# Patient Record
Sex: Female | Born: 1965 | Race: White | Hispanic: No | Marital: Married | Smoking: Never smoker
Health system: Southern US, Community
[De-identification: ages and names within clinical notes are randomized; demographics above are authoritative.]

## PROBLEM LIST (undated history)

## (undated) ENCOUNTER — Emergency Department (HOSPITAL_COMMUNITY): Payer: 59

## (undated) DIAGNOSIS — N183 Chronic kidney disease, stage 3 unspecified: Secondary | ICD-10-CM

## (undated) DIAGNOSIS — Z8542 Personal history of malignant neoplasm of other parts of uterus: Secondary | ICD-10-CM

## (undated) DIAGNOSIS — C541 Malignant neoplasm of endometrium: Secondary | ICD-10-CM

## (undated) DIAGNOSIS — F988 Other specified behavioral and emotional disorders with onset usually occurring in childhood and adolescence: Secondary | ICD-10-CM

## (undated) DIAGNOSIS — Z87820 Personal history of traumatic brain injury: Secondary | ICD-10-CM

## (undated) DIAGNOSIS — N289 Disorder of kidney and ureter, unspecified: Secondary | ICD-10-CM

## (undated) DIAGNOSIS — K5790 Diverticulosis of intestine, part unspecified, without perforation or abscess without bleeding: Secondary | ICD-10-CM

## (undated) DIAGNOSIS — E039 Hypothyroidism, unspecified: Secondary | ICD-10-CM

## (undated) DIAGNOSIS — K219 Gastro-esophageal reflux disease without esophagitis: Secondary | ICD-10-CM

## (undated) DIAGNOSIS — D219 Benign neoplasm of connective and other soft tissue, unspecified: Secondary | ICD-10-CM

## (undated) DIAGNOSIS — T7840XA Allergy, unspecified, initial encounter: Secondary | ICD-10-CM

## (undated) DIAGNOSIS — K589 Irritable bowel syndrome without diarrhea: Secondary | ICD-10-CM

## (undated) DIAGNOSIS — R7303 Prediabetes: Secondary | ICD-10-CM

## (undated) DIAGNOSIS — E079 Disorder of thyroid, unspecified: Secondary | ICD-10-CM

## (undated) HISTORY — PX: TRANSTHORACIC ECHOCARDIOGRAM: SHX275

## (undated) HISTORY — DX: Allergy, unspecified, initial encounter: T78.40XA

## (undated) HISTORY — PX: ENDOMETRIAL ABLATION: SHX621

## (undated) HISTORY — DX: Diverticulosis of intestine, part unspecified, without perforation or abscess without bleeding: K57.90

## (undated) HISTORY — PX: TUBAL LIGATION: SHX77

## (undated) HISTORY — DX: Benign neoplasm of connective and other soft tissue, unspecified: D21.9

## (undated) HISTORY — DX: Malignant neoplasm of endometrium: C54.1

## (undated) HISTORY — PX: SHOULDER SURGERY: SHX246

## (undated) HISTORY — DX: Personal history of traumatic brain injury: Z87.820

## (undated) HISTORY — DX: Gastro-esophageal reflux disease without esophagitis: K21.9

## (undated) HISTORY — DX: Prediabetes: R73.03

## (undated) HISTORY — DX: Personal history of malignant neoplasm of other parts of uterus: Z85.42

## (undated) HISTORY — DX: Hypothyroidism, unspecified: E03.9

## (undated) HISTORY — DX: Other specified behavioral and emotional disorders with onset usually occurring in childhood and adolescence: F98.8

## (undated) HISTORY — PX: ABDOMINAL HYSTERECTOMY: SHX81

## (undated) HISTORY — DX: Irritable bowel syndrome, unspecified: K58.9

## (undated) HISTORY — DX: Disorder of kidney and ureter, unspecified: N28.9

## (undated) HISTORY — DX: Chronic kidney disease, stage 3 unspecified: N18.30

## (undated) HISTORY — DX: Disorder of thyroid, unspecified: E07.9

---

## 2010-11-23 LAB — HM MAMMOGRAPHY

## 2014-06-17 HISTORY — PX: COLONOSCOPY: SHX174

## 2015-09-01 ENCOUNTER — Encounter: Payer: Self-pay | Admitting: Family Medicine

## 2015-12-27 LAB — LIPID PANEL
CHOLESTEROL: 203 — AB (ref 0–200)
HDL: 71 — AB (ref 35–70)
LDL CALC: 112
TRIGLYCERIDES: 99 (ref 40–160)

## 2015-12-27 LAB — CBC AND DIFFERENTIAL
HEMATOCRIT: 40 (ref 36–46)
HEMOGLOBIN: 13.4 (ref 12.0–16.0)
Platelets: 278 (ref 150–399)
WBC: 9.2

## 2015-12-27 LAB — BASIC METABOLIC PANEL
BUN: 15 (ref 4–21)
GLUCOSE: 88
Potassium: 4.3 (ref 3.4–5.3)
SODIUM: 136 — AB (ref 137–147)

## 2015-12-27 LAB — HEPATIC FUNCTION PANEL
ALT: 15 (ref 7–35)
AST: 22 (ref 13–35)

## 2015-12-27 LAB — TSH: TSH: 1.79 (ref <=5.90)

## 2018-02-19 ENCOUNTER — Ambulatory Visit: Payer: Self-pay | Admitting: Family Medicine

## 2018-02-19 ENCOUNTER — Encounter: Payer: Self-pay | Admitting: Family Medicine

## 2018-02-19 ENCOUNTER — Other Ambulatory Visit: Payer: Self-pay

## 2018-02-19 ENCOUNTER — Ambulatory Visit (INDEPENDENT_AMBULATORY_CARE_PROVIDER_SITE_OTHER): Payer: Self-pay

## 2018-02-19 VITALS — BP 120/80 | HR 99 | Temp 98.6°F | Ht 70.0 in | Wt 157.4 lb

## 2018-02-19 DIAGNOSIS — K589 Irritable bowel syndrome without diarrhea: Secondary | ICD-10-CM | POA: Insufficient documentation

## 2018-02-19 DIAGNOSIS — R1084 Generalized abdominal pain: Secondary | ICD-10-CM

## 2018-02-19 DIAGNOSIS — K921 Melena: Secondary | ICD-10-CM

## 2018-02-19 DIAGNOSIS — Z8542 Personal history of malignant neoplasm of other parts of uterus: Secondary | ICD-10-CM | POA: Insufficient documentation

## 2018-02-19 DIAGNOSIS — K5790 Diverticulosis of intestine, part unspecified, without perforation or abscess without bleeding: Secondary | ICD-10-CM

## 2018-02-19 DIAGNOSIS — K219 Gastro-esophageal reflux disease without esophagitis: Secondary | ICD-10-CM

## 2018-02-19 DIAGNOSIS — K580 Irritable bowel syndrome with diarrhea: Secondary | ICD-10-CM

## 2018-02-19 HISTORY — DX: Diverticulosis of intestine, part unspecified, without perforation or abscess without bleeding: K57.90

## 2018-02-19 LAB — HEMOCCULT GUIAC POC 1CARD (OFFICE): Fecal Occult Blood, POC: NEGATIVE

## 2018-02-19 LAB — COMPREHENSIVE METABOLIC PANEL
ALBUMIN: 4.3 g/dL (ref 3.5–5.2)
ALK PHOS: 93 U/L (ref 39–117)
ALT: 20 U/L (ref 0–35)
AST: 21 U/L (ref 0–37)
BILIRUBIN TOTAL: 0.5 mg/dL (ref 0.2–1.2)
BUN: 12 mg/dL (ref 6–23)
CO2: 33 mEq/L — ABNORMAL HIGH (ref 19–32)
CREATININE: 1.08 mg/dL (ref 0.40–1.20)
Calcium: 10 mg/dL (ref 8.4–10.5)
Chloride: 101 mEq/L (ref 96–112)
GFR: 56.32 mL/min — AB (ref >60.00)
GLUCOSE: 101 mg/dL — AB (ref 70–99)
Potassium: 4.5 mEq/L (ref 3.5–5.1)
Sodium: 140 mEq/L (ref 135–145)
TOTAL PROTEIN: 7.2 g/dL (ref 6.0–8.3)

## 2018-02-19 LAB — CBC WITH DIFFERENTIAL/PLATELET
BASOS ABS: 0 10*3/uL (ref 0.0–0.1)
Basophils Relative: 0.6 % (ref 0.0–3.0)
EOS PCT: 2.1 % (ref 0.0–5.0)
Eosinophils Absolute: 0.2 10*3/uL (ref 0.0–0.7)
HEMATOCRIT: 40.6 % (ref 36.0–46.0)
HEMOGLOBIN: 13.7 g/dL (ref 12.0–15.0)
LYMPHS PCT: 24.3 % (ref 12.0–46.0)
Lymphs Abs: 1.9 10*3/uL (ref 0.7–4.0)
MCHC: 33.7 g/dL (ref 30.0–36.0)
MCV: 92 fl (ref 78.0–100.0)
MONOS PCT: 6.9 % (ref 3.0–12.0)
Monocytes Absolute: 0.5 10*3/uL (ref 0.1–1.0)
NEUTROS PCT: 66.1 % (ref 43.0–77.0)
Neutro Abs: 5 10*3/uL (ref 1.4–7.7)
Platelets: 320 10*3/uL (ref 150.0–400.0)
RBC: 4.42 Mil/uL (ref 3.87–5.11)
RDW: 12.5 % (ref 11.5–15.5)
WBC: 7.6 10*3/uL (ref 4.0–10.5)

## 2018-02-19 LAB — LIPASE: Lipase: 34 U/L (ref 11.0–59.0)

## 2018-02-19 MED ORDER — AMOXICILLIN-POT CLAVULANATE 875-125 MG PO TABS: 1.0000 | ORAL_TABLET | Freq: Two times a day (BID) | ORAL | 0 refills | Status: AC

## 2018-02-19 MED ORDER — METRONIDAZOLE 500 MG PO TABS: 500.0000 mg | ORAL_TABLET | Freq: Two times a day (BID) | ORAL | 0 refills | Status: DC

## 2018-02-19 MED ORDER — CIPROFLOXACIN HCL 500 MG PO TABS: 500.0000 mg | ORAL_TABLET | Freq: Two times a day (BID) | ORAL | 0 refills | Status: DC

## 2018-02-19 MED ORDER — TRAMADOL HCL 50 MG PO TABS: 50.0000 mg | ORAL_TABLET | Freq: Three times a day (TID) | ORAL | 0 refills | Status: DC | PRN

## 2018-02-19 NOTE — Patient Instructions (Signed)
Please return early next week for recheck.  Go to the ER for severe abdominal pain or bleeding from rectum.   We will treat for possible diverticulitis: flagyl and cipro are antibiotics. Tramadol can be used for pain.  Keep track of your temperature.   Please go to our [facility] office to get your xrays done. You can walk in M-F between 8am and 5pm. Tell them you are there for xrays ordered by me. They will send me the results, then I will let you know the results with instructions.   Address: [place], [place], [place]  (office sits at [address] at [address] intersection; from here, turn left onto [place] [address]), take to [address], turn right and go for a mile or so, office will be on left across form [employer] ) I will release your lab results to you on your MyChart account with further instructions. Please reply with any questions.    It was a pleasure meeting you today! Thank you for choosing [place] to meet your healthcare needs! I truly look forward to working with you. If you have any questions or concerns, please send me a message via Mychart or call the office at [phone].

## 2018-02-19 NOTE — Progress Notes (Addendum)
Subjective  CC:  Chief Complaint  Patient presents with  . Establish Care    Moved from [place], 1 year ago, Last Physical 3 years ago   . Abdominal Pain    Lower Abdominal Pain since Saturday and she states her stool was black yesterday, 3 stools yesterday and all black     HPI: Candace Allen is a 52 y.o. female who presents to [place] at [facility] today to establish care with me as a new patient.  However she is been having moderate to severe abdominal pain for the last 5 days and is here for acute visit She is here with her husband.  Moved back from [place] area about a year ago.  Has not establish care with a physician yet.  She does not have health insurance.  They are small business owners. No old records available via care everywhere. [facility]" She has the following concerns or needs:  52 year old female with reported history of irritable bowel syndrome and gastritis proven by endoscopy 3 years ago and reportedly normal colonoscopy with diverticular disease noted presents with 5-day history of bloating, moderate to severe abdominal cramping, soreness to touch, nausea without vomiting, and increased bowel movements with 3 episodes of black tarry stool.  She reports feeling warm but has not checked her temperature.  No palpitations, lightheadedness, chest pain, pleuritic chest pain or urinary symptoms.  She is had problems with her abdomen for the last 3 to 4 years, that is what prompted her GI referral.  Records are not available for review.  She had been placed on PPIs for her gastritis.  She does not use them in the last 2 years.  She does report active GERD symptoms.  No weight loss.  She eats light.  Exercises regularly.  Reports history of chronic kidney disease although I do not have records and she is not exactly sure why she has this.  History of hypertension last treated over a year ago.  Normotensive since then.  No chronic  medications  Assessment  1. Generalized abdominal pain   2. Melena   3. History of endometrial cancer   4. Gastroesophageal reflux disease without esophagitis   5. Irritable bowel syndrome with diarrhea   6. Diverticulosis      Plan   Abdominal pain: Negative stool guaiac and rectal exam today.  Patient is hemodynamically stable.  Nontoxic-appearing.  Differential diagnosis is broad but includes diverticulitis, IBS flare, chronic constipation, appendicitis, cholecystitis, biliary colic etc.  Initial work-up includes the following: Blood work, acute abdominal series, empirically treat for diverticulitis with pain medication and Augmentin and extensive education given while in office.  Red flag symptoms discussed.  If worsens, patient will assess care via the ER.  Otherwise follow-up here early next week for recheck.  If worsening, recommend abdominal CT.  Patient understands and agrees with care plan.  Follow up:  Return in about 5 days (around 02/24/2018) for recheck. Orders Placed This Encounter  Procedures  . DG Abd Acute W/Chest  . CBC with Differential/Platelet  . Comprehensive metabolic panel  . Lipase  . POCT Occult Blood Stool   Meds ordered this encounter  Medications  . traMADol (ULTRAM) 50 MG tablet    Sig: Take 1 tablet (50 mg total) by mouth every 8 (eight) hours as needed.    Dispense:  30 tablet    Refill:  0  . DISCONTD: metroNIDAZOLE (FLAGYL) 500 MG tablet    Sig: Take 1  tablet (500 mg total) by mouth 2 (two) times daily for 7 days.    Dispense:  14 tablet    Refill:  0  . DISCONTD: ciprofloxacin (CIPRO) 500 MG tablet    Sig: Take 1 tablet (500 mg total) by mouth 2 (two) times daily for 7 days.    Dispense:  14 tablet    Refill:  0  . amoxicillin-clavulanate (AUGMENTIN) 875-125 MG tablet    Sig: Take 1 tablet by mouth 2 (two) times daily for 10 days. Please disregard orders for flagyl and cipro.    Dispense:  20 tablet    Refill:  0     No flowsheet data  found.  We updated and reviewed the patient's past history in detail and it is documented below.  Patient Active Problem List   Diagnosis Date Noted  . History of endometrial cancer 02/19/2018  . GERD (gastroesophageal reflux disease) 02/19/2018  . IBS (irritable bowel syndrome) 02/19/2018  . Diverticulosis 02/19/2018    By colonoscopy 2016    Health Maintenance  Topic Date Due  . HIV Screening  09/27/1979  . TETANUS/TDAP  09/27/1983  . PAP SMEAR  09/26/1985  . MAMMOGRAM  09/27/2014  . INFLUENZA VACCINE  01/15/2018  . COLONOSCOPY  09/15/2024    There is no immunization history on file for this patient. No outpatient medications have been marked as taking for the 02/19/18 encounter (Office Visit) with Leamon Arnt, MD.    Allergies: Patient is allergic to flagyl [metronidazole] and iodine. Past Medical History Patient  has a past medical history of Diverticulosis (02/19/2018), History of endometrial cancer (02/19/2018), concussion, and Kidney disease. Past Surgical History Patient  has a past surgical history that includes Shoulder surgery; Abdominal hysterectomy; and Endometrial ablation. Family History: Patient family history includes Aortic dissection in her maternal grandmother; Healthy in her daughter and son; Hypotension in her maternal grandmother; Testicular cancer in her brother. Social History:  Patient  reports that she has never smoked. She has never used smokeless tobacco. She reports that she drinks alcohol. She reports that she does not use drugs.  Review of Systems: Constitutional: negative for fever or malaise Ophthalmic: negative for photophobia, double vision or loss of vision Cardiovascular: negative for chest pain, dyspnea on exertion, or new LE swelling Respiratory: negative for SOB or persistent cough Gastrointestinal: POSITIVE for abdominal pain, change in bowel habits and melena x 2 days Genitourinary: negative for dysuria or gross  hematuria Musculoskeletal: negative for new gait disturbance or muscular weakness Integumentary: negative for new or persistent rashes Neurological: negative for TIA or stroke symptoms Psychiatric: negative for SI or delusions Allergic/Immunologic: negative for hives  Patient Care Team    Relationship Specialty Notifications Start End  Leamon Arnt, MD PCP - General Family Medicine  02/19/18     Objective  Vitals: BP 120/80   Pulse 99   Temp 98.6 F (37 C)   Ht 5' 10"  (1.778 m)   Wt 157 lb 6.4 oz (71.4 kg)   SpO2 99%   BMI 22.58 kg/m  General:  Well developed, well nourished, nontoxic-appearing but uncomfortable with certain movements.   Psych:  Alert and oriented,normal mood and affect HEENT:  Normocephalic, atraumatic, non-icteric sclera, PERRL, oropharynx is without mass or exudate, supple neck without adenopathy, mass or thyromegaly Cardiovascular:  RRR without gallop, rub or murmur, nondisplaced PMI Respiratory:  Good breath sounds bilaterally, CTAB with normal respiratory effort Gastrointestinal: Hypoactive bowel sounds, soft, diffuse abdominal tenderness without guarding or rebound,  no noted masses. No HSM, Rectal exam: Hard dark brown stool in vault.  Guaiac negative, nontender exam.  No masses, no hemorrhoids. MSK: no deformities, contusions. Joints are without erythema or swelling Skin:  Warm, no rashes or suspicious lesions noted Neurologic:    Mental status is normal. Gross motor and sensory exams are normal. Normal gait   Commons side effects, risks, benefits, and alternatives for medications and treatment plan prescribed today were discussed, and the patient expressed understanding of the given instructions. Patient is instructed to call or message via MyChart if he/she has any questions or concerns regarding our treatment plan. No barriers to understanding were identified. We discussed Red Flag symptoms and signs in detail. Patient expressed understanding regarding  what to do in case of urgent or emergency type symptoms.   Medication list was reconciled, printed and provided to the patient in AVS. Patient instructions and summary information was reviewed with the patient as documented in the AVS. This note was prepared with assistance of Dragon voice recognition software. Occasional wrong-word or sound-a-like substitutions may have occurred due to the inherent limitations of voice recognition software

## 2018-02-20 ENCOUNTER — Ambulatory Visit: Payer: Self-pay | Admitting: Family Medicine

## 2018-02-20 ENCOUNTER — Encounter: Payer: Self-pay | Admitting: Family Medicine

## 2018-02-20 ENCOUNTER — Emergency Department (HOSPITAL_COMMUNITY): Payer: Medicaid Other

## 2018-02-20 ENCOUNTER — Emergency Department (HOSPITAL_COMMUNITY)
Admission: EM | Admit: 2018-02-20 | Discharge: 2018-02-20 | Disposition: A | Discharge status: Home / Self Care | Payer: Medicaid Other | Attending: Emergency Medicine | Admitting: Emergency Medicine

## 2018-02-20 DIAGNOSIS — Z8542 Personal history of malignant neoplasm of other parts of uterus: Secondary | ICD-10-CM | POA: Insufficient documentation

## 2018-02-20 DIAGNOSIS — G459 Transient cerebral ischemic attack, unspecified: Secondary | ICD-10-CM

## 2018-02-20 DIAGNOSIS — K2960 Other gastritis without bleeding: Secondary | ICD-10-CM

## 2018-02-20 DIAGNOSIS — K219 Gastro-esophageal reflux disease without esophagitis: Secondary | ICD-10-CM | POA: Insufficient documentation

## 2018-02-20 DIAGNOSIS — R531 Weakness: Secondary | ICD-10-CM

## 2018-02-20 DIAGNOSIS — F419 Anxiety disorder, unspecified: Secondary | ICD-10-CM

## 2018-02-20 DIAGNOSIS — R4781 Slurred speech: Secondary | ICD-10-CM | POA: Insufficient documentation

## 2018-02-20 LAB — I-STAT CHEM 8, ED
BUN: 13 mg/dL (ref 6–20)
CHLORIDE: 103 mmol/L (ref 98–111)
Calcium, Ion: 1.17 mmol/L (ref 1.15–1.40)
Creatinine, Ser: 1.2 mg/dL — ABNORMAL HIGH (ref 0.44–1.00)
Glucose, Bld: 106 mg/dL — ABNORMAL HIGH (ref 70–99)
HCT: 41 % (ref 36.0–46.0)
Hemoglobin: 13.9 g/dL (ref 12.0–15.0)
Potassium: 3.7 mmol/L (ref 3.5–5.1)
SODIUM: 138 mmol/L (ref 135–145)
TCO2: 24 mmol/L (ref 22–32)

## 2018-02-20 LAB — COMPREHENSIVE METABOLIC PANEL
ALBUMIN: 3.7 g/dL (ref 3.5–5.0)
ALK PHOS: 88 U/L (ref 38–126)
ALT: 25 U/L (ref 0–44)
ANION GAP: 13 (ref 5–15)
AST: 26 U/L (ref 15–41)
BUN: 12 mg/dL (ref 6–20)
CALCIUM: 9.9 mg/dL (ref 8.9–10.3)
CO2: 23 mmol/L (ref 22–32)
CREATININE: 1.11 mg/dL — AB (ref 0.44–1.00)
Chloride: 103 mmol/L (ref 98–111)
GFR calc Af Amer: >60 mL/min (ref >60)
GFR calc non Af Amer: 56 mL/min — ABNORMAL LOW (ref >60)
GLUCOSE: 106 mg/dL — AB (ref 70–99)
Potassium: 3.9 mmol/L (ref 3.5–5.1)
SODIUM: 139 mmol/L (ref 135–145)
Total Bilirubin: 0.7 mg/dL (ref 0.3–1.2)
Total Protein: 7.2 g/dL (ref 6.5–8.1)

## 2018-02-20 LAB — CBC
HEMATOCRIT: 42.5 % (ref 36.0–46.0)
HEMOGLOBIN: 13.9 g/dL (ref 12.0–15.0)
MCH: 30.8 pg (ref 26.0–34.0)
MCHC: 32.7 g/dL (ref 30.0–36.0)
MCV: 94 fL (ref 78.0–100.0)
Platelets: 282 10*3/uL (ref 150–400)
RBC: 4.52 MIL/uL (ref 3.87–5.11)
RDW: 11.4 % — ABNORMAL LOW (ref 11.5–15.5)
WBC: 5.8 10*3/uL (ref 4.0–10.5)

## 2018-02-20 LAB — PROTIME-INR
INR: 0.99
Prothrombin Time: 13 seconds (ref 11.4–15.2)

## 2018-02-20 LAB — DIFFERENTIAL
Abs Immature Granulocytes: 0 10*3/uL (ref 0.0–0.1)
BASOS ABS: 0.1 10*3/uL (ref 0.0–0.1)
Basophils Relative: 1 %
Eosinophils Absolute: 0.2 10*3/uL (ref 0.0–0.7)
Eosinophils Relative: 3 %
IMMATURE GRANULOCYTES: 0 %
LYMPHS PCT: 45 %
Lymphs Abs: 2.6 10*3/uL (ref 0.7–4.0)
Monocytes Absolute: 0.4 10*3/uL (ref 0.1–1.0)
Monocytes Relative: 7 %
NEUTROS ABS: 2.5 10*3/uL (ref 1.7–7.7)
Neutrophils Relative %: 44 %

## 2018-02-20 LAB — I-STAT BETA HCG BLOOD, ED (MC, WL, AP ONLY)

## 2018-02-20 LAB — I-STAT TROPONIN, ED: Troponin i, poc: 0.03 ng/mL (ref 0.00–0.08)

## 2018-02-20 LAB — APTT: APTT: 35 s (ref 24–36)

## 2018-02-20 MED ORDER — GI COCKTAIL ~~LOC~~
30.0000 mL | Freq: Once | ORAL | Status: AC
  Administered 2018-02-20: 30 mL via ORAL
  Filled 2018-02-20: qty 30

## 2018-02-20 MED ORDER — ASPIRIN EC 325 MG PO TBEC: 650.0000 mg | DELAYED_RELEASE_TABLET | Freq: Every day | ORAL | Status: DC

## 2018-02-20 MED ORDER — ONDANSETRON HCL 4 MG PO TABS
4.0000 mg | ORAL_TABLET | Freq: Once | ORAL | Status: AC
  Administered 2018-02-20: 4 mg via ORAL
  Filled 2018-02-20: qty 1

## 2018-02-20 MED ORDER — ASPIRIN EC 325 MG PO TBEC
650.0000 mg | DELAYED_RELEASE_TABLET | Freq: Once | ORAL | Status: AC
  Administered 2018-02-20: 650 mg via ORAL
  Filled 2018-02-20: qty 2

## 2018-02-20 MED ORDER — SUCRALFATE 1 GM/10ML PO SUSP: 1.0000 g | Freq: Three times a day (TID) | ORAL | 0 refills | Status: DC

## 2018-02-20 NOTE — Consult Note (Signed)
Referring Physician: Dr. Darl Householder    Chief Complaint: Left sided weakness  HPI: Candace Allen is an 52 y.o. female who presented to the ED this evening via EMS with a c/c of acute onset left sided weakness and numbness. Initially determined to have a LKN of 1730; however, while in MRI the patient then stated to SRT nurse that she went to sleep at 1115 and then woke up with the new symptoms.   Earlier today, a telephone encounter note resulting from a phone call between the patient's husband and the patient's family practice RN documents that the husband was concerned about his wife having symptoms of generalized weakness and not feeling well. The patient was then contacted by phone and endorsed numbness and tingling in her left shoulder with similar symptoms extending from her right elbow to her fingers. The patient had stated that she had felt normal until she took her antibiotic, Tylenol, Omeprazole, and tramadol. She also endorsed having a headache with N/V after taking her meds. She then went to sleep, waking up at 5:30 with symptoms still present, in addition to chest pressure with dyspnea, dizziness, blurred vision, slurred speech and difficulty getting the words out. EMS was then called and patient was transported to the ED emergently.   Of note, she has a history of surgery to her left shoulder.   PMHx includes stage 3 CKD. The patient has an allergy to iodinated contrast (anaphylaxis).   LSN: [id number] tPA Given: No: Out of time window.   Past Medical History:  Diagnosis Date  . Diverticulosis 02/19/2018   By colonoscopy 2016  . History of endometrial cancer 02/19/2018  . Hx of concussion   . Kidney disease     Past Surgical History:  Procedure Laterality Date  . ABDOMINAL HYSTERECTOMY     partial; endometrial cancer  . ENDOMETRIAL ABLATION    . SHOULDER SURGERY      Family History  Problem Relation Age of Onset  . Testicular cancer Brother   . Healthy Daughter   . Healthy Son   .  Hypotension Maternal Grandmother   . Aortic dissection Maternal Grandmother    Social History:  reports that she has never smoked. She has never used smokeless tobacco. She reports that she drinks alcohol. She reports that she does not use drugs.  Allergies:  Allergies  Allergen Reactions  . Flagyl [Metronidazole]   . Iodine     Medications:  Augmentin Tramadol Tylenol Omeprazole  ROS: As per HPI.   Physical Examination: There were no vitals taken for this visit.  HEENT: [place]/AT Lungs: Respirations unlabored Ext: No edema  Neurologic Examination: Mental Status: Alert, fully oriented, anxious affect.  Speech fluent with intact comprehension for basic motor commands and questions about her symptoms, as well as intact naming. No dysarthria. Appears confused when asked to follow a 2-step directional command, but then is able to correctly follow a different 3 step command without difficulty. Cranial Nerves: II:  Visual fields intact without extinction to DSS. PERRL.  III,IV, VI: EOMI without nystagmus. No ptosis.  V,VII: Temp sensation with dysesthesia on the left. No facial droop VIII: hearing intact to voice IX,X: Palate rises symmetrically XI: Symmetric XII: midline tongue extension  Motor: RUE: 5/5 RLE: Will resist examiner with 4/5 strength on hip flexion, knee extension and ADF. Symmetric with LLE strength but does not exhibit pained/effortful affect when moving RLE.  LUE: Slow, labored movement requiring fine motor control when lifting antigravity. When raised passively, LUE drops halfway  above bed, then is maintained in that position without wavering or further drift, suggestive of embellishment. When resisting examiner, LUE with varying strength responding to coaching with a max of 4/5 proximally and distally.  LLE: Able to lift antigravity with an effortful and pained affect. Will resist examiner with 4/5 strength on hip flexion, knee extension and ADF.  Sensory:  Dysesthesia to cool temperature object LUE and LLE. Normal on the right. No extinction to DSS.  Deep Tendon Reflexes:  1+ bilateral brachioradialis and biceps. 2+ patellae bilaterally.  1+ achilles bilaterally.  Plantars: Right: downgoing   Left: downgoing Cerebellar: No ataxia with FNF bilaterally. However, FNF on left is severely slowed, but normal fine motor control in multiple joints is evident when performing the task slowly. When distracted, movement of LUE is of normal speed. Performs H-S bilaterally with no ataxia, but with labored affect when performing with LLE, which is also with slow movements requiring normal fine motor control and strength to execute.  Gait: Deferred  Results for orders placed or performed during the hospital encounter of 02/20/18 (from the past 48 hour(s))  Protime-INR     Status: None   Collection Time: 02/20/18  8:02 PM  Result Value Ref Range   Prothrombin Time 13.0 11.4 - 15.2 seconds   INR 0.99     Comment: Performed at [facility] [address], [place], [place] [address]  APTT     Status: None   Collection Time: 02/20/18  8:02 PM  Result Value Ref Range   aPTT 35 24 - 36 seconds    Comment: Performed at [facility] [address], [place], [place] [address]  CBC     Status: Abnormal   Collection Time: 02/20/18  8:02 PM  Result Value Ref Range   WBC 5.8 4.0 - 10.5 K/uL   RBC 4.52 3.87 - 5.11 MIL/uL   Hemoglobin 13.9 12.0 - 15.0 g/dL   HCT 42.5 36.0 - 46.0 %   MCV 94.0 78.0 - 100.0 fL   MCH 30.8 26.0 - 34.0 pg   MCHC 32.7 30.0 - 36.0 g/dL   RDW 11.4 (L) 11.5 - 15.5 %   Platelets 282 150 - 400 K/uL    Comment: Performed at [facility] [address], [place], [place] [address]  Differential     Status: None   Collection Time: 02/20/18  8:02 PM  Result Value Ref Range   Neutrophils Relative % 44 %   Neutro Abs 2.5 1.7 - 7.7 K/uL   Lymphocytes Relative 45 %   Lymphs Abs 2.6 0.7 - 4.0 K/uL   Monocytes Relative 7 %    Monocytes Absolute 0.4 0.1 - 1.0 K/uL   Eosinophils Relative 3 %   Eosinophils Absolute 0.2 0.0 - 0.7 K/uL   Basophils Relative 1 %   Basophils Absolute 0.1 0.0 - 0.1 K/uL   Immature Granulocytes 0 %   Abs Immature Granulocytes 0.0 0.0 - 0.1 K/uL    Comment: Performed at [facility] [address] [place], [place] [address]  I-stat troponin, ED     Status: None   Collection Time: 02/20/18  8:24 PM  Result Value Ref Range   Troponin i, poc 0.03 0.00 - 0.08 ng/mL   Comment 3            Comment: Due to the release kinetics of cTnI, a negative result within the first hours of the onset of symptoms does not  rule out myocardial infarction with certainty. If myocardial infarction is still suspected, repeat the test at appropriate intervals.   I-Stat Chem 8, ED     Status: Abnormal   Collection Time: 02/20/18  8:25 PM  Result Value Ref Range   Sodium 138 135 - 145 mmol/L   Potassium 3.7 3.5 - 5.1 mmol/L   Chloride 103 98 - 111 mmol/L   BUN 13 6 - 20 mg/dL   Creatinine, Ser 1.20 (H) 0.44 - 1.00 mg/dL   Glucose, Bld 106 (H) 70 - 99 mg/dL   Calcium, Ion 1.17 1.15 - 1.40 mmol/L   TCO2 24 22 - 32 mmol/L   Hemoglobin 13.9 12.0 - 15.0 g/dL   HCT 41.0 36.0 - 46.0 %   Dg Abd Acute W/chest  Result Date: 02/20/2018 CLINICAL DATA:  Generalized abdominal pain. EXAM: DG ABDOMEN ACUTE W/ 1V CHEST COMPARISON:  No prior. FINDINGS: Mediastinum hilar structures normal. Lungs are clear. No pleural effusion or pneumothorax. Heart size normal. Soft tissue structures the abdomen are unremarkable. No bowel distention. Stool noted throughout the colon. No free air. Pelvic calcifications noted most consistent phleboliths. Distal ureteral stones can't be completely excluded. Thoracolumbar spine scoliosis. IMPRESSION: 1.  No acute cardiopulmonary disease. 2.  No acute intra-abdominal abnormality. Electronically Signed   By: Marcello Moores  Register   On: 02/20/2018 07:34   Ct Head Code Stroke Wo Contrast  Result  Date: 02/20/2018 CLINICAL DATA:  Code stroke. 52 y/o F; left-sided weakness and numbness. EXAM: CT HEAD WITHOUT CONTRAST TECHNIQUE: Contiguous axial images were obtained from the base of the skull through the vertex without intravenous contrast. COMPARISON:  None. FINDINGS: Brain: No evidence of acute infarction, hemorrhage, hydrocephalus, extra-axial collection or mass lesion/mass effect. Vascular: No hyperdense vessel or unexpected calcification. Skull: Normal. Negative for fracture or focal lesion. Sinuses/Orbits: No acute finding. Other: None. ASPECTS [facility] Stroke Program Early CT Score) - Ganglionic level infarction (caudate, lentiform nuclei, internal capsule, insula, M1-M3 cortex): 7 - Supraganglionic infarction (M4-M6 cortex): 3 Total score (0-10 with 10 being normal): 10 IMPRESSION: 1. No acute intracranial abnormality identified. Unremarkable CT of the head. 2. ASPECTS is 10 These results were called by telephone at the time of interpretation on 02/20/2018 at 8:23 pm to Dr. Shirlyn Goltz , who verbally acknowledged these results. Electronically Signed   By: Kristine Garbe M.D.   On: 02/20/2018 20:25    Assessment: 52 y.o. female with acute onset of LUE and LLE weakness with subjective sensory numbness and dysesthesia 1. Exam with several discrepancies that appear non-physiological and most consistent with embellishment. No clear localizable findings.  2. CT head negative for acute abnormality 3. Anxiety.   4. Stroke Risk Factors - No definitive risk factors, although CKD may increase the risk of both thrombosis and hemorrhage.  5. CKD and anaphylactic reaction to iodinated contrast per patient. Risks of CTA felt to significantly outweigh benefits. Will obtain STAT MR imaging as safest alternative  Plan: 1. Given relatively high probability of an underlying etiology other than stroke for her presentation, as well as relatively low NIHSS, overall risks of tPA are felt to outweigh benefits.  After risk of hemorrhage, alternate possible diagnosis and potential benefits of tPA were discussed with patient, she deferred decision on treatment until after MRI brain. She was informed of possible delay to treatment with possibility of worsened outcome if MRI reveals a stroke, due to added time needed to obtain MRI. Following this discussion, the patient endorsed to rapid response nurse that  he symptoms had actually begun at 1115 today, which puts her out of the tPA time window.  2. STAT MRI brain, MRA head and MRA neck ordered without contrast.  3. If MRI is positive for stroke or if MRA shows occlusion, will reconsider possible endovascular treatment.  4. If MR imaging more consistent with stroke/TIA/hypoperfusion, obtain TTE as well.   5. PT consult, OT consult, Speech consult 6. Prophylactic therapy- to be determined based upon results of work up. 7. Telemetry monitoring 8. Continue frequent neuro checks  40 minutes spent in the emergent neurological evaluation and management of this patient with acute left sided weakness  @Electronically  signed: Dr. Kerney Elbe 02/20/2018, 8:46 PM

## 2018-02-20 NOTE — Code Documentation (Addendum)
Responded to Code Stroke called at 45.  Pt arrived at [place] with L side weakness/numbness.  CBG-106, NIH-4.  LSN originally paged out as 1730, however per chart, pt became dizzy with N/V after taking her antibiotic around 1112.  She then went to sleep and woke up at 1730 with L sided weakness. LSN changed to 1112. CT-negative for acute changes.  Pt allergic to Iodine so unable to complete CTA.  Pt to STAT MRI at 2024.

## 2018-02-20 NOTE — Telephone Encounter (Signed)
Pt's husband called with concern that wife is feeling generalized weakness and is not feeling well. Husband is driving home form [place] and is not with pt. Called the number listed and verified with husband and left voicemail to call back.  Pt's neighbor called back and put pt on the phone. Pt stated that she is having numbness and tingling to her left  shoulder and numbness and tingling from right elbow to right fingers. Pt stated she felt normal until after she took her antibiotic, Tylenol, Omeprazole, and tramadol. She stated that she had a headache at the time (approx. 1112). Pt stated she became dizzy, became nauseated and vomited and then went to sleep. She woke up at 5:30 and felt the symptoms. Pt also stated that it felt like she had a "horse sitting on her chest.". Pt stated she was having difficulty breathing, dizziness, blurred vision and slurred speech and difficulty getting the right words out. Asked pt to hold up both arms and pt unable to lift the left arm as high as the right. Pt informed that I was going to ask the neighbor to call 911 for her. Pt given reassurance and emotional support. Called back and neighbor called EMS and they were en route. Pt [facility] to [facility] ED. Called husband back and updated him.   Reason for Disposition . [1] Numbness (i.e., loss of sensation) of the face, arm / hand, or leg / foot on one side of the body AND [2] sudden onset AND [3] present now  Answer Assessment - Initial Assessment Questions 1. SYMPTOM: "What is the main symptom you are concerned about?" (e.g., weakness, numbness)     Numbness and tingling left arm to shoulder right fingers to elbow 2. ONSET: "When did this start?" (minutes, hours, days; while sleeping)     Got nauseated and went to sleep 3. LAST NORMAL: "When was the last time you were normal (no symptoms)?"     Before the medicine that pt took at 1100 4. PATTERN "Does this come and go, or has it been constant since it  started?"  "Is it present now?"     constant 5. CARDIAC SYMPTOMS: "Have you had any of the following symptoms: chest pain, difficulty breathing, palpitations?"     "Feels like a horse sitting on chest", difficulty breathing, nausea 6. NEUROLOGIC SYMPTOMS: "Have you had any of the following symptoms: headache, dizziness, vision loss, double vision, changes in speech, unsteady on your feet?"     Headache earlier today but now at the back of neck, dizziness,blurred vision, slurred speech and difficult getting words out left arm drift 7. OTHER SYMPTOMS: "Do you have any other symptoms?"     Nausea and vomiting  Protocols used: NEUROLOGIC DEFICIT-A-AH

## 2018-02-20 NOTE — ED Provider Notes (Addendum)
[facility] [place] EMERGENCY DEPARTMENT Provider Note   CSN: [id number] Arrival date & time: 02/20/18  4627   An emergency department physician performed an initial assessment on this suspected stroke patient at 2002.  History   Chief Complaint No chief complaint on file.   HPI Candace Allen is a 52 y.o. female history of diverticulosis here presenting with trouble speaking, left-sided weakness.  Patient states that around 5:30 PM, she has some trouble speaking as well as some left-sided weakness.  Patient's husband then called the office and office reached her and noticed that she has some slurred speech saw called EMS.  EMS activated code stroke. Dr. Cheral Marker from neurology saw patient on arrival.  Of note, patient has been having intermittent abdominal bloating and distention for the last several days.  She had an acute abdominal series this morning that showed some constipation.  She also was started on Augmentin, omeprazole, tramadol and took a first dose this morning.  She states that she has sensitivities to medicines, especially to pain medicines.  She never had a history of stroke in the past.  Just moved here from [place].   The history is provided by the patient.    Past Medical History:  Diagnosis Date  . Diverticulosis 02/19/2018   By colonoscopy 2016  . History of endometrial cancer 02/19/2018  . Hx of concussion   . Kidney disease     Patient Active Problem List   Diagnosis Date Noted  . History of endometrial cancer 02/19/2018  . GERD (gastroesophageal reflux disease) 02/19/2018  . IBS (irritable bowel syndrome) 02/19/2018  . Diverticulosis 02/19/2018    Past Surgical History:  Procedure Laterality Date  . ABDOMINAL HYSTERECTOMY     partial; endometrial cancer  . ENDOMETRIAL ABLATION    . SHOULDER SURGERY       OB History   None      Home Medications    Prior to Admission medications   Medication Sig Start Date End Date Taking? Authorizing  Provider  amoxicillin-clavulanate (AUGMENTIN) 875-125 MG tablet Take 1 tablet by mouth 2 (two) times daily for 10 days. Please disregard orders for flagyl and cipro. 02/19/18 03/01/18  Leamon Arnt, MD  sucralfate (CARAFATE) 1 GM/10ML suspension Take 10 mLs (1 g total) by mouth 4 (four) times daily -  with meals and at bedtime. 02/20/18   Drenda Freeze, MD  traMADol (ULTRAM) 50 MG tablet Take 1 tablet (50 mg total) by mouth every 8 (eight) hours as needed. 02/19/18   Leamon Arnt, MD    Family History Family History  Problem Relation Age of Onset  . Testicular cancer Brother   . Healthy Daughter   . Healthy Son   . Hypotension Maternal Grandmother   . Aortic dissection Maternal Grandmother     Social History Social History   Tobacco Use  . Smoking status: Never Smoker  . Smokeless tobacco: Never Used  Substance Use Topics  . Alcohol use: Yes    Comment: occasionally   . Drug use: Never     Allergies   Flagyl [metronidazole] and Iodine   Review of Systems Review of Systems  Neurological: Positive for speech difficulty and weakness.  All other systems reviewed and are negative.    Physical Exam Updated Vital Signs There were no vitals taken for this visit.  Physical Exam  Constitutional: She is oriented to person, place, and time. She appears well-developed and well-nourished.  HENT:  Head: Normocephalic.  Mouth/Throat: Oropharynx is  clear and moist.  Eyes: Pupils are equal, round, and reactive to light. Conjunctivae and EOM are normal.  Neck: Normal range of motion.  Cardiovascular: Normal rate, regular rhythm and normal heart sounds.  Pulmonary/Chest: Effort normal and breath sounds normal. No stridor. No respiratory distress.  Abdominal: Soft. Bowel sounds are normal. She exhibits no distension. There is no tenderness.  Musculoskeletal: Normal range of motion.  Neurological: She is alert and oriented to person, place, and time.  No obvious slurred speech.  No facial droop. CN 2- 12 intact. ? 4/5 strength L arm and leg but sensation intact   Skin: Skin is warm. Capillary refill takes less than 2 seconds.  Psychiatric: She has a normal mood and affect.  Nursing note and vitals reviewed.    ED Treatments / Results  Labs (all labs ordered are listed, but only abnormal results are displayed) Labs Reviewed  CBC - Abnormal; Notable for the following components:      Result Value   RDW 11.4 (*)    All other components within normal limits  COMPREHENSIVE METABOLIC PANEL - Abnormal; Notable for the following components:   Glucose, Bld 106 (*)    Creatinine, Ser 1.11 (*)    GFR calc non Af Amer 56 (*)    All other components within normal limits  I-STAT CHEM 8, ED - Abnormal; Notable for the following components:   Creatinine, Ser 1.20 (*)    Glucose, Bld 106 (*)    All other components within normal limits  PROTIME-INR  APTT  DIFFERENTIAL  I-STAT TROPONIN, ED  I-STAT BETA HCG BLOOD, ED ([facility], [facility], [facility] ONLY)  CBG MONITORING, ED    EKG EKG Interpretation  Date/Time:  Friday February 20 2018 21:41:02 EDT Ventricular Rate:  62 PR Interval:    QRS Duration: 85 QT Interval:  440 QTC Calculation: 447 R Axis:   77 Text Interpretation:  Sinus rhythm Borderline short PR interval RSR' in V1 or V2, probably normal variant No previous ECGs available Confirmed by Wandra Arthurs ([id number]) on 02/20/2018 10:17:23 PM   Radiology Mr Jodene Nam Head Wo Contrast  Result Date: 02/20/2018 CLINICAL DATA:  52 y/o  F; left-sided weakness and numbness. EXAM: MRI HEAD WITHOUT CONTRAST MRA HEAD WITHOUT CONTRAST MRA NECK WITHOUT CONTRAST TECHNIQUE: Multiplanar, multiecho pulse sequences of the brain and surrounding structures were obtained without intravenous contrast. Angiographic images of the Circle of Willis were obtained using MRA technique without intravenous contrast. Angiographic images of the neck were obtained using MRA technique without intravenous contrast. Carotid  stenosis measurements (when applicable) are obtained utilizing NASCET criteria, using the distal internal carotid diameter as the denominator. COMPARISON:  02/20/2018 CT head. FINDINGS: MRI HEAD FINDINGS Brain: No acute infarction, hemorrhage, hydrocephalus, extra-axial collection or mass lesion. Vascular: Normal flow voids. Skull and upper cervical spine: Normal marrow signal. Sinuses/Orbits: Negative. Other: None. MRA HEAD FINDINGS Internal carotid arteries:  Patent. Anterior cerebral arteries:  Patent. Middle cerebral arteries: Patent. Anterior communicating artery: Patent. Posterior communicating arteries: Patent right posterior communicating artery. No left posterior communicating artery identified, likely hypoplastic or absent. Posterior cerebral arteries:  Patent. Basilar artery:  Patent. Vertebral arteries:  Patent. No evidence of high-grade stenosis, large vessel occlusion, or aneurysm. MRA NECK FINDINGS Aortic arch: Patent. Right common carotid artery: Patent. Right internal carotid artery: Patent. Right vertebral artery: Patent.  Right dominant. Left common carotid artery: Patent. Left Internal carotid artery: Patent. Left Vertebral artery: Patent. There is no evidence of hemodynamically significant stenosis by NASCET criteria,  occlusion, or aneurysm. IMPRESSION: 1. No acute intracranial abnormality identified. Unremarkable MRI of the brain. 2. Normal MRA of the head. 3. Normal MRA of the neck. Electronically Signed   By: Kristine Garbe M.D.   On: 02/20/2018 21:52   Mr Jodene Nam Neck Wo Contrast  Result Date: 02/20/2018 CLINICAL DATA:  52 y/o  F; left-sided weakness and numbness. EXAM: MRI HEAD WITHOUT CONTRAST MRA HEAD WITHOUT CONTRAST MRA NECK WITHOUT CONTRAST TECHNIQUE: Multiplanar, multiecho pulse sequences of the brain and surrounding structures were obtained without intravenous contrast. Angiographic images of the Circle of Willis were obtained using MRA technique without intravenous  contrast. Angiographic images of the neck were obtained using MRA technique without intravenous contrast. Carotid stenosis measurements (when applicable) are obtained utilizing NASCET criteria, using the distal internal carotid diameter as the denominator. COMPARISON:  02/20/2018 CT head. FINDINGS: MRI HEAD FINDINGS Brain: No acute infarction, hemorrhage, hydrocephalus, extra-axial collection or mass lesion. Vascular: Normal flow voids. Skull and upper cervical spine: Normal marrow signal. Sinuses/Orbits: Negative. Other: None. MRA HEAD FINDINGS Internal carotid arteries:  Patent. Anterior cerebral arteries:  Patent. Middle cerebral arteries: Patent. Anterior communicating artery: Patent. Posterior communicating arteries: Patent right posterior communicating artery. No left posterior communicating artery identified, likely hypoplastic or absent. Posterior cerebral arteries:  Patent. Basilar artery:  Patent. Vertebral arteries:  Patent. No evidence of high-grade stenosis, large vessel occlusion, or aneurysm. MRA NECK FINDINGS Aortic arch: Patent. Right common carotid artery: Patent. Right internal carotid artery: Patent. Right vertebral artery: Patent.  Right dominant. Left common carotid artery: Patent. Left Internal carotid artery: Patent. Left Vertebral artery: Patent. There is no evidence of hemodynamically significant stenosis by NASCET criteria, occlusion, or aneurysm. IMPRESSION: 1. No acute intracranial abnormality identified. Unremarkable MRI of the brain. 2. Normal MRA of the head. 3. Normal MRA of the neck. Electronically Signed   By: Kristine Garbe M.D.   On: 02/20/2018 21:52   Mr Brain Wo Contrast  Result Date: 02/20/2018 CLINICAL DATA:  52 y/o  F; left-sided weakness and numbness. EXAM: MRI HEAD WITHOUT CONTRAST MRA HEAD WITHOUT CONTRAST MRA NECK WITHOUT CONTRAST TECHNIQUE: Multiplanar, multiecho pulse sequences of the brain and surrounding structures were obtained without intravenous  contrast. Angiographic images of the Circle of Willis were obtained using MRA technique without intravenous contrast. Angiographic images of the neck were obtained using MRA technique without intravenous contrast. Carotid stenosis measurements (when applicable) are obtained utilizing NASCET criteria, using the distal internal carotid diameter as the denominator. COMPARISON:  02/20/2018 CT head. FINDINGS: MRI HEAD FINDINGS Brain: No acute infarction, hemorrhage, hydrocephalus, extra-axial collection or mass lesion. Vascular: Normal flow voids. Skull and upper cervical spine: Normal marrow signal. Sinuses/Orbits: Negative. Other: None. MRA HEAD FINDINGS Internal carotid arteries:  Patent. Anterior cerebral arteries:  Patent. Middle cerebral arteries: Patent. Anterior communicating artery: Patent. Posterior communicating arteries: Patent right posterior communicating artery. No left posterior communicating artery identified, likely hypoplastic or absent. Posterior cerebral arteries:  Patent. Basilar artery:  Patent. Vertebral arteries:  Patent. No evidence of high-grade stenosis, large vessel occlusion, or aneurysm. MRA NECK FINDINGS Aortic arch: Patent. Right common carotid artery: Patent. Right internal carotid artery: Patent. Right vertebral artery: Patent.  Right dominant. Left common carotid artery: Patent. Left Internal carotid artery: Patent. Left Vertebral artery: Patent. There is no evidence of hemodynamically significant stenosis by NASCET criteria, occlusion, or aneurysm. IMPRESSION: 1. No acute intracranial abnormality identified. Unremarkable MRI of the brain. 2. Normal MRA of the head. 3. Normal MRA of the neck. Electronically Signed  By: Kristine Garbe M.D.   On: 02/20/2018 21:52   Dg Abd Acute W/chest  Result Date: 02/20/2018 CLINICAL DATA:  Generalized abdominal pain. EXAM: DG ABDOMEN ACUTE W/ 1V CHEST COMPARISON:  No prior. FINDINGS: Mediastinum hilar structures normal. Lungs are clear.  No pleural effusion or pneumothorax. Heart size normal. Soft tissue structures the abdomen are unremarkable. No bowel distention. Stool noted throughout the colon. No free air. Pelvic calcifications noted most consistent phleboliths. Distal ureteral stones can't be completely excluded. Thoracolumbar spine scoliosis. IMPRESSION: 1.  No acute cardiopulmonary disease. 2.  No acute intra-abdominal abnormality. Electronically Signed   By: Marcello Moores  Register   On: 02/20/2018 07:34   Ct Head Code Stroke Wo Contrast  Result Date: 02/20/2018 CLINICAL DATA:  Code stroke. 52 y/o F; left-sided weakness and numbness. EXAM: CT HEAD WITHOUT CONTRAST TECHNIQUE: Contiguous axial images were obtained from the base of the skull through the vertex without intravenous contrast. COMPARISON:  None. FINDINGS: Brain: No evidence of acute infarction, hemorrhage, hydrocephalus, extra-axial collection or mass lesion/mass effect. Vascular: No hyperdense vessel or unexpected calcification. Skull: Normal. Negative for fracture or focal lesion. Sinuses/Orbits: No acute finding. Other: None. ASPECTS [facility] Stroke Program Early CT Score) - Ganglionic level infarction (caudate, lentiform nuclei, internal capsule, insula, M1-M3 cortex): 7 - Supraganglionic infarction (M4-M6 cortex): 3 Total score (0-10 with 10 being normal): 10 IMPRESSION: 1. No acute intracranial abnormality identified. Unremarkable CT of the head. 2. ASPECTS is 10 These results were called by telephone at the time of interpretation on 02/20/2018 at 8:23 pm to Dr. Shirlyn Goltz , who verbally acknowledged these results. Electronically Signed   By: Kristine Garbe M.D.   On: 02/20/2018 20:25    Procedures Procedures (including critical care time)  Medications Ordered in ED Medications  aspirin EC tablet 650 mg (has no administration in time range)  gi cocktail (Maalox,Lidocaine,Donnatal) (has no administration in time range)     Initial Impression / Assessment and  Plan / ED Course  I have reviewed the triage vital signs and the nursing notes.  Pertinent labs & imaging results that were available during my care of the patient were reviewed by me and considered in my medical decision making (see chart for details).     Candace Allen is a 52 y.o. female here with slurred speech, L sided weakness. Symptoms improving already. She appears very anxious and took some tramadol prior to arrival. Consider medication side effect. Neurology saw patient and will get CT head, MRI brain and MRA.   10:17 PM MRI and MRA and CT negative. Labs [place]. I discussed with Dr. Cheral Marker who doesn't recommend any further neuro workup. I think likely side effect of tramadol. Told her to hold off on tramadol and continue her augmentin, omeprazole. Will refer to neuro, GI outpatient.    Final Clinical Impressions(s) / ED Diagnoses   Final diagnoses:  Slurred speech  Reflux gastritis    ED Discharge Orders         Ordered    sucralfate (CARAFATE) 1 GM/10ML suspension  3 times daily with meals & bedtime     02/20/18 2215           Drenda Freeze, MD 02/20/18 2214    Drenda Freeze, MD 02/20/18 2218

## 2018-02-20 NOTE — ED Notes (Addendum)
Initial NIHSS accomplished by rapid response team member at 2030. See EHR for details.

## 2018-02-20 NOTE — ED Notes (Addendum)
Pt was able to ambulate to wheelchair without assistance at time of discharge. Pt expressed being "just a little weak on the left" as she sat in the wheelchair. Clear speech noted at time of discharge. Pt given paper scrub pants for ride home. Instructions given to husband who verbalized understanding of instructions and prescription. Pt alert and oriented at time of discharge (~22:50). Pain also assessed at 0/10.

## 2018-02-20 NOTE — Discharge Instructions (Addendum)
Stop taking tramadol as it may cause your weakness and slurred speech.   Continue augmentin, prilosec.   You may take carafate to help with your stomach pain.   You should see your primary care doctor next week   You can consider following up with neurology if you have weakness and slurred speech.   See GI doctor for follow up for possible endoscopy if you have persistent abdominal pain   Return to ER if you have worse weakness, slurred speech, abdominal pain, vomiting.

## 2018-02-23 NOTE — Telephone Encounter (Signed)
FYI

## 2018-02-24 ENCOUNTER — Encounter: Payer: Self-pay | Admitting: Family Medicine

## 2018-02-24 ENCOUNTER — Ambulatory Visit: Payer: Self-pay | Admitting: Family Medicine

## 2018-02-24 ENCOUNTER — Other Ambulatory Visit: Payer: Self-pay

## 2018-02-24 VITALS — BP 124/80 | HR 67 | Ht 70.0 in | Wt 156.2 lb

## 2018-02-24 DIAGNOSIS — K581 Irritable bowel syndrome with constipation: Secondary | ICD-10-CM

## 2018-02-24 DIAGNOSIS — R1084 Generalized abdominal pain: Secondary | ICD-10-CM

## 2018-02-24 NOTE — Progress Notes (Signed)
Subjective  CC:  Chief Complaint  Patient presents with  . Follow-up    went to ER friday, states that she has symptoms of a stroke, all tests came back normal.   . Abdominal Pain    still having constipation and feels bloated    HPI: Candace Allen is a 52 y.o. female who presents to the office today to address the problems listed above in the chief complaint.  [name] is here today for follow-up of abdominal pain.  Unfortunately, since I saw her last week, she ended up going to the emergency room due to paresis, lightheadedness and dizziness.  She was worked up for acute stroke, however, her exam and symptoms were more consistent with side effects from tramadol.  She has not had any more of the symptoms and has not taken any more pain medicine  Abdominal pain: Work-up revealed normal lab work, normal acute abdominal series except for stool in the colon, she was treated with antibiotics and reports her pain is improved however she still feels bloated and constipated.  She is taking mag citrate and Dulcolax without resolution of her constipation.  Further history taking: She endorses chronic abdominal constipation and bloating symptoms.  Endoscopy and colonoscopy most with IBS.  Is not to take medicines.  She is a very healthy diet and exercises regularly although she has not been able to recently due to pain.  She admits to recent stressors with acute illness of her grandmother.  Assessment  1. Generalized abdominal pain   2. Irritable bowel syndrome with constipation      Plan   IBS: Symptoms most consistent with acute flare of IBS with chronic constipation.  Sense of counseling and education given.  Doubt that she had acute diverticulitis to me stop antibiotics.  Recommend natural treatment methods including rest, meditation, possibly acupuncture.  Patient declines prescription medications at this time.  Recommend Gas-X, MiraLAX and Colace to help with constipation.  Offered GI referral but  patient declines at this time, this is reasonable  Follow up: Return in about 3 months (around 05/26/2018) for complete physical.   No orders of the defined types were placed in this encounter.  No orders of the defined types were placed in this encounter.     I reviewed the patients updated PMH, FH, and SocHx.    Patient Active Problem List   Diagnosis Date Noted  . History of endometrial cancer 02/19/2018  . GERD (gastroesophageal reflux disease) 02/19/2018  . IBS (irritable bowel syndrome) 02/19/2018  . Diverticulosis 02/19/2018   Current Meds  Medication Sig  . amoxicillin-clavulanate (AUGMENTIN) 875-125 MG tablet Take 1 tablet by mouth 2 (two) times daily for 10 days. Please disregard orders for flagyl and cipro.  . [DISCONTINUED] sucralfate (CARAFATE) 1 GM/10ML suspension Take 10 mLs (1 g total) by mouth 4 (four) times daily -  with meals and at bedtime.    Allergies: Patient is allergic to flagyl [metronidazole]; iodine; and tramadol hcl. Family History: Patient family history includes Aortic dissection in her maternal grandmother; Healthy in her daughter and son; Hypotension in her maternal grandmother; Testicular cancer in her brother. Social History:  Patient  reports that she has never smoked. She has never used smokeless tobacco. She reports that she drinks alcohol. She reports that she does not use drugs.  Review of Systems: Constitutional: Negative for fever malaise or anorexia Cardiovascular: negative for chest pain Respiratory: negative for SOB or persistent cough Gastrointestinal: negative for abdominal pain  Objective  Vitals: BP 124/80   Pulse 67   Ht 5' 10"  (1.778 m)   Wt 156 lb 3.2 oz (70.9 kg)   SpO2 98%   BMI 22.41 kg/m  General: no acute distress , A&Ox3 Psych: Flat affect, at times tearful, limited insight HEENT: PEERL, conjunctiva normal, Oropharynx moist,neck is supple Cardiovascular:  RRR without murmur or gallop.  Respiratory:  Good  breath sounds bilaterally, CTAB with normal respiratory effort Abdominal exam: Hypoactive bowel sounds, soft with diffuse minimal tenderness without mass, rebound, guarding Skin:  Warm, no rashes  No visits with results within 1 Day(s) from this visit.  Latest known visit with results is:  Admission on 02/20/2018, Discharged on 02/20/2018  Component Date Value Ref Range Status  . Prothrombin Time 02/20/2018 13.0  11.4 - 15.2 seconds Final  . INR 02/20/2018 0.99   Final  . aPTT 02/20/2018 35  24 - 36 seconds Final  . WBC 02/20/2018 5.8  4.0 - 10.5 K/uL Final  . RBC 02/20/2018 4.52  3.87 - 5.11 MIL/uL Final  . Hemoglobin 02/20/2018 13.9  12.0 - 15.0 g/dL Final  . HCT 02/20/2018 42.5  36.0 - 46.0 % Final  . MCV 02/20/2018 94.0  78.0 - 100.0 fL Final  . MCH 02/20/2018 30.8  26.0 - 34.0 pg Final  . MCHC 02/20/2018 32.7  30.0 - 36.0 g/dL Final  . RDW 02/20/2018 11.4* 11.5 - 15.5 % Final  . Platelets 02/20/2018 282  150 - 400 K/uL Final  . Neutrophils Relative % 02/20/2018 44  % Final  . Neutro Abs 02/20/2018 2.5  1.7 - 7.7 K/uL Final  . Lymphocytes Relative 02/20/2018 45  % Final  . Lymphs Abs 02/20/2018 2.6  0.7 - 4.0 K/uL Final  . Monocytes Relative 02/20/2018 7  % Final  . Monocytes Absolute 02/20/2018 0.4  0.1 - 1.0 K/uL Final  . Eosinophils Relative 02/20/2018 3  % Final  . Eosinophils Absolute 02/20/2018 0.2  0.0 - 0.7 K/uL Final  . Basophils Relative 02/20/2018 1  % Final  . Basophils Absolute 02/20/2018 0.1  0.0 - 0.1 K/uL Final  . Immature Granulocytes 02/20/2018 0  % Final  . Abs Immature Granulocytes 02/20/2018 0.0  0.0 - 0.1 K/uL Final  . Sodium 02/20/2018 139  135 - 145 mmol/L Final  . Potassium 02/20/2018 3.9  3.5 - 5.1 mmol/L Final  . Chloride 02/20/2018 103  98 - 111 mmol/L Final  . CO2 02/20/2018 23  22 - 32 mmol/L Final  . Glucose, Bld 02/20/2018 106* 70 - 99 mg/dL Final  . BUN 02/20/2018 12  6 - 20 mg/dL Final  . Creatinine, Ser 02/20/2018 1.11* 0.44 - 1.00 mg/dL  Final  . Calcium 02/20/2018 9.9  8.9 - 10.3 mg/dL Final  . Total Protein 02/20/2018 7.2  6.5 - 8.1 g/dL Final  . Albumin 02/20/2018 3.7  3.5 - 5.0 g/dL Final  . AST 02/20/2018 26  15 - 41 U/L Final  . ALT 02/20/2018 25  0 - 44 U/L Final  . Alkaline Phosphatase 02/20/2018 88  38 - 126 U/L Final  . Total Bilirubin 02/20/2018 0.7  0.3 - 1.2 mg/dL Final  . GFR calc non Af Amer 02/20/2018 56* >60 mL/min Final  . GFR calc Af Amer 02/20/2018 >60  >60 mL/min Final  . Anion gap 02/20/2018 13  5 - 15 Final  . Troponin i, poc 02/20/2018 0.03  0.00 - 0.08 ng/mL Final  . Comment 3 02/20/2018  Final  . Sodium 02/20/2018 138  135 - 145 mmol/L Final  . Potassium 02/20/2018 3.7  3.5 - 5.1 mmol/L Final  . Chloride 02/20/2018 103  98 - 111 mmol/L Final  . BUN 02/20/2018 13  6 - 20 mg/dL Final  . Creatinine, Ser 02/20/2018 1.20* 0.44 - 1.00 mg/dL Final  . Glucose, Bld 02/20/2018 106* 70 - 99 mg/dL Final  . Calcium, Ion 02/20/2018 1.17  1.15 - 1.40 mmol/L Final  . TCO2 02/20/2018 24  22 - 32 mmol/L Final  . Hemoglobin 02/20/2018 13.9  12.0 - 15.0 g/dL Final  . HCT 02/20/2018 41.0  36.0 - 46.0 % Final  . I-stat hCG, quantitative 02/20/2018 <5.0  <5 mIU/mL Final  . Comment 3 02/20/2018          Final      Commons side effects, risks, benefits, and alternatives for medications and treatment plan prescribed today were discussed, and the patient expressed understanding of the given instructions. Patient is instructed to call or message via MyChart if he/she has any questions or concerns regarding our treatment plan. No barriers to understanding were identified. We discussed Red Flag symptoms and signs in detail. Patient expressed understanding regarding what to do in case of urgent or emergency type symptoms.   Medication list was reconciled, printed and provided to the patient in AVS. Patient instructions and summary information was reviewed with the patient as documented in the AVS. This note was  prepared with assistance of Dragon voice recognition software. Occasional wrong-word or sound-a-like substitutions may have occurred due to the inherent limitations of voice recognition software

## 2018-02-24 NOTE — Patient Instructions (Signed)
Please return in 1-3 months for your annual complete physical; please come fasting.   If you have any questions or concerns, please don't hesitate to send me a message via MyChart or call the office at [phone]. Thank you for visiting with [place] today! It's our pleasure caring for you.  Use colace and miralax to help manage your symptoms.   Irritable Bowel Syndrome, Adult Irritable bowel syndrome (IBS) is not one specific disease. It is a group of symptoms that affects the organs responsible for digestion (gastrointestinal or GI tract). To regulate how your GI tract works, your body sends signals back and forth between your intestines and your brain. If you have IBS, there may be a problem with these signals. As a result, your GI tract does not function normally. Your intestines may become more sensitive and overreact to certain things. This is especially true when you eat certain foods or when you are under stress. There are four types of IBS. These may be determined based on the consistency of your stool:  IBS with diarrhea.  IBS with constipation.  Mixed IBS.  Unsubtyped IBS.  It is important to know which type of IBS you have. Some treatments are more likely to be helpful for certain types of IBS. What are the causes? The exact cause of IBS is not known. What increases the risk? You may have a higher risk of IBS if:  You are a woman.  You are younger than 53 years old.  You have a family history of IBS.  You have mental health problems.  You have had bacterial infection of your GI tract.  What are the signs or symptoms? Symptoms of IBS vary from person to person. The main symptom is abdominal pain or discomfort. Additional symptoms usually include one or more of the following:  Diarrhea, constipation, or both.  Abdominal swelling or bloating.  Feeling full or sick after eating a small or regular-size meal.  Frequent gas.  Mucus in the stool.  A feeling of having  more stool left after a bowel movement.  Symptoms tend to come and go. They may be associated with stress, psychiatric conditions, or nothing at all. How is this diagnosed? There is no specific test to diagnose IBS. Your health care provider will make a diagnosis based on a physical exam, medical history, and your symptoms. You may have other tests to rule out other conditions that may be causing your symptoms. These may include:  Blood tests.  X-rays.  CT scan.  Endoscopy and colonoscopy. This is a test in which your GI tract is viewed with a long, thin, flexible tube.  How is this treated? There is no cure for IBS, but treatment can help relieve symptoms. IBS treatment often includes:  Changes to your diet, such as: ? Eating more fiber. ? Avoiding foods that cause symptoms. ? Drinking more water. ? Eating regular, medium-sized portioned meals.  Medicines. These may include: ? Fiber supplements if you have constipation. ? Medicine to control diarrhea (antidiarrheal medicines). ? Medicine to help control muscle spasms in your GI tract (antispasmodic medicines). ? Medicines to help with any mental health issues, such as antidepressants or tranquilizers.  Therapy. ? Talk therapy may help with anxiety, depression, or other mental health issues that can make IBS symptoms worse.  Stress reduction. ? Managing your stress can help keep symptoms under control.  Follow these instructions at home:  Take medicines only as directed by your health care provider.  Eat a  healthy diet. ? Avoid foods and drinks with added sugar. ? Include more whole grains, fruits, and vegetables gradually into your diet. This may be especially helpful if you have IBS with constipation. ? Avoid any foods and drinks that make your symptoms worse. These may include dairy products and caffeinated or carbonated drinks. ? Do not eat large meals. ? Drink enough fluid to keep your urine clear or pale  yellow.  Exercise regularly. Ask your health care provider for recommendations of good activities for you.  Keep all follow-up visits as directed by your health care provider. This is important. Contact a health care provider if:  You have constant pain.  You have trouble or pain with swallowing.  You have worsening diarrhea. Get help right away if:  You have severe and worsening abdominal pain.  You have diarrhea and: ? You have a rash, stiff neck, or severe headache. ? You are irritable, sleepy, or difficult to awaken. ? You are weak, dizzy, or extremely thirsty.  You have bright red blood in your stool or you have black tarry stools.  You have unusual abdominal swelling that is painful.  You vomit continuously.  You vomit blood (hematemesis).  You have both abdominal pain and a fever. This information is not intended to replace advice given to you by your health care provider. Make sure you discuss any questions you have with your health care provider. Document Released: 06/03/2005 Document Revised: 11/03/2015 Document Reviewed: 02/18/2014 Elsevier Interactive Patient Education  2018 [employer].

## 2018-03-06 ENCOUNTER — Encounter: Payer: Self-pay | Admitting: Emergency Medicine

## 2018-03-12 ENCOUNTER — Encounter: Payer: Self-pay | Admitting: Family Medicine

## 2018-06-29 ENCOUNTER — Ambulatory Visit: Payer: Self-pay | Admitting: Family Medicine

## 2018-06-29 ENCOUNTER — Encounter: Payer: Self-pay | Admitting: Family Medicine

## 2018-06-29 ENCOUNTER — Other Ambulatory Visit: Payer: Self-pay

## 2018-06-29 VITALS — BP 118/82 | HR 66 | Temp 98.5°F | Resp 16 | Ht 70.0 in | Wt 161.6 lb

## 2018-06-29 DIAGNOSIS — J0100 Acute maxillary sinusitis, unspecified: Secondary | ICD-10-CM

## 2018-06-29 MED ORDER — AMOXICILLIN-POT CLAVULANATE 875-125 MG PO TABS: 1.0000 | ORAL_TABLET | Freq: Two times a day (BID) | ORAL | 0 refills | Status: DC

## 2018-06-29 MED ORDER — FLUTICASONE PROPIONATE 50 MCG/ACT NA SUSP: 2.0000 | Freq: Every day | NASAL | 6 refills | Status: DC

## 2018-06-29 NOTE — Progress Notes (Signed)
Subjective   CC:  Chief Complaint  Patient presents with  . Sinusitis    Symptoms started before Christmas, has tried OTC medication    HPI: Candace Allen is a 53 y.o. female who presents to the office today to address the problems listed above in the chief complaint.  Patient reports sinus congestion and pressure with thick drainage, mild nonproductive cough, ear pressure without pain, and mild malaise.  Symptoms have been present for several weeks. No relief with OTC meds.Shedenies high fevers, GI symptoms, shortness of breath. Shehas had sinus infections in the past and this feels similar.  Patient is a non-smoker.  No history of asthma or COPD.  I reviewed the patients updated PMH, FH, and SocHx.    Patient Active Problem List   Diagnosis Date Noted  . History of endometrial cancer 02/19/2018  . GERD (gastroesophageal reflux disease) 02/19/2018  . IBS (irritable bowel syndrome) 02/19/2018  . Diverticulosis 02/19/2018   No outpatient medications have been marked as taking for the 06/29/18 encounter (Office Visit) with Leamon Arnt, MD.    Review of Systems: Cardiovascular: negative for chest pain Respiratory: negative for SOB or persistent cough Gastrointestinal: negative for abdominal pain Genitourinary: negative for dysuria or gross hematuria  Objective  Vitals: BP 118/82   Pulse 66   Temp 98.5 F (36.9 C) (Oral)   Resp 16   Ht 5' 10"  (1.778 m)   Wt 161 lb 9.6 oz (73.3 kg)   SpO2 97%   BMI 23.19 kg/m  General: no acute distress  Psych:  Alert and oriented, normal mood and affect HEENT:  Normocephalic, atraumatic, TMs with serous effusions or retraction w/o erythema, nasal mucosa is red with purulent drainage, tender maxillary sinus present, OP mild erythematous w/o eudate, supple neck without LAD Cardiovascular:  RRR without murmur or gallop. no peripheral edema Respiratory:  Good breath sounds bilaterally, CTAB with normal respiratory effort Skin:  Warm, no  rashes Neurologic:   Mental status is normal. normal gait  Assessment  1. Acute non-recurrent maxillary sinusitis      Plan    Sinusitis: History and exam is most consistent with bacterial sinus infection.  Etiology and prognosis discussed with patient.  Recommend antibiotics as ordered below.  Patient to complete course of antibiotics, use supportive medications like mucolytics and decongestants as needed.  May use Tylenol or Advil if needed.  Symptoms should improve over the next 2 weeks.  Patient will return or call if symptoms persist or worsen.  Follow up: No follow-ups on file.    Commons side effects, risks, benefits, and alternatives for medications and treatment plan prescribed today were discussed, and the patient expressed understanding of the given instructions. Patient is instructed to call or message via MyChart if he/she has any questions or concerns regarding our treatment plan. No barriers to understanding were identified. We discussed Red Flag symptoms and signs in detail. Patient expressed understanding regarding what to do in case of urgent or emergency type symptoms.   Medication list was reconciled, printed and provided to the patient in AVS. Patient instructions and summary information was reviewed with the patient as documented in the AVS. This note was prepared with assistance of Dragon voice recognition software. Occasional wrong-word or sound-a-like substitutions may have occurred due to the inherent limitations of voice recognition software  No orders of the defined types were placed in this encounter.  Meds ordered this encounter  Medications  . amoxicillin-clavulanate (AUGMENTIN) 875-125 MG tablet  Sig: Take 1 tablet by mouth 2 (two) times daily.    Dispense:  20 tablet    Refill:  0  . fluticasone (FLONASE) 50 MCG/ACT nasal spray    Sig: Place 2 sprays into both nostrils daily.    Dispense:  16 g    Refill:  6

## 2018-06-29 NOTE — Patient Instructions (Signed)
Please return in 1-3 months for your annual complete physical; please come fasting.   If you have any questions or concerns, please don't hesitate to send me a message via MyChart or call the office at [phone]. Thank you for visiting with [place] today! It's our pleasure caring for you.   Sinusitis, Adult Sinusitis is soreness and swelling (inflammation) of your sinuses. Sinuses are hollow spaces in the bones around your face. They are located:  Around your eyes.  In the middle of your forehead.  Behind your nose.  In your cheekbones. Your sinuses and nasal passages are lined with a fluid called mucus. Mucus drains out of your sinuses. Swelling can trap mucus in your sinuses. This lets germs (bacteria, virus, or fungus) grow, which leads to infection. Most of the time, this condition is caused by a virus. What are the causes? This condition is caused by:  Allergies.  Asthma.  Germs.  Things that block your nose or sinuses.  Growths in the nose (nasal polyps).  Chemicals or irritants in the air.  Fungus (rare). What increases the risk? You are more likely to develop this condition if:  You have a weak body defense system (immune system).  You do a lot of swimming or diving.  You use nasal sprays too much.  You smoke. What are the signs or symptoms? The main symptoms of this condition are pain and a feeling of pressure around the sinuses. Other symptoms include:  Stuffy nose (congestion).  Runny nose (drainage).  Swelling and warmth in the sinuses.  Headache.  Toothache.  A cough that may get worse at night.  Mucus that collects in the throat or the back of the nose (postnasal drip).  Being unable to smell and taste.  Being very tired (fatigue).  A fever.  Sore throat.  Bad breath. How is this diagnosed? This condition is diagnosed based on:  Your symptoms.  Your medical history.  A physical exam.  Tests to find out if your condition is  short-term (acute) or long-term (chronic). Your doctor may: ? Check your nose for growths (polyps). ? Check your sinuses using a tool that has a light (endoscope). ? Check for allergies or germs. ? Do imaging tests, such as an MRI or CT scan. How is this treated? Treatment for this condition depends on the cause and whether it is short-term or long-term.  If caused by a virus, your symptoms should go away on their own within 10 days. You may be given medicines to relieve symptoms. They include: ? Medicines that shrink swollen tissue in the nose. ? Medicines that treat allergies (antihistamines). ? A spray that treats swelling of the nostrils. ? Rinses that help get rid of thick mucus in your nose (nasal saline washes).  If caused by bacteria, your doctor may wait to see if you will get better without treatment. You may be given antibiotic medicine if you have: ? A very bad infection. ? A weak body defense system.  If caused by growths in the nose, you may need to have surgery. Follow these instructions at home: Medicines  Take, use, or apply over-the-counter and prescription medicines only as told by your doctor. These may include nasal sprays.  If you were prescribed an antibiotic medicine, take it as told by your doctor. Do not stop taking the antibiotic even if you start to feel better. Hydrate and humidify   Drink enough water to keep your pee (urine) pale yellow.  Use a cool  mist humidifier to keep the humidity level in your home above 50%.  Breathe in steam for 10-15 minutes, 3-4 times a day, or as told by your doctor. You can do this in the bathroom while a hot shower is running.  Try not to spend time in cool or dry air. Rest  Rest as much as you can.  Sleep with your head raised (elevated).  Make sure you get enough sleep each night. General instructions   Put a warm, moist washcloth on your face 3-4 times a day, or as often as told by your doctor. This will  help with discomfort.  Wash your hands often with soap and water. If there is no soap and water, use hand sanitizer.  Do not smoke. Avoid being around people who are smoking (secondhand smoke).  Keep all follow-up visits as told by your doctor. This is important. Contact a doctor if:  You have a fever.  Your symptoms get worse.  Your symptoms do not get better within 10 days. Get help right away if:  You have a very bad headache.  You cannot stop throwing up (vomiting).  You have very bad pain or swelling around your face or eyes.  You have trouble seeing.  You feel confused.  Your neck is stiff.  You have trouble breathing. Summary  Sinusitis is swelling of your sinuses. Sinuses are hollow spaces in the bones around your face.  This condition is caused by tissues in your nose that become inflamed or swollen. This traps germs. These can lead to infection.  If you were prescribed an antibiotic medicine, take it as told by your doctor. Do not stop taking it even if you start to feel better.  Keep all follow-up visits as told by your doctor. This is important. This information is not intended to replace advice given to you by your health care provider. Make sure you discuss any questions you have with your health care provider. Document Released: 11/20/2007 Document Revised: 11/03/2017 Document Reviewed: 11/03/2017 Elsevier Interactive Patient Education  2019 [employer].

## 2018-07-06 ENCOUNTER — Ambulatory Visit: Payer: Self-pay | Admitting: *Deleted

## 2018-07-06 MED ORDER — GUAIFENESIN-CODEINE 100-10 MG/5ML PO SOLN: 5.0000 mL | Freq: Four times a day (QID) | ORAL | 0 refills | Status: DC | PRN

## 2018-07-06 NOTE — Telephone Encounter (Signed)
Pt. Reports she completed her antibiotic, but is still coughing - day and night. Can not sleep because of the cough. Cough is non-productive. No fever. Has some nasal drainage. No other symptoms. Requesting medication be sent to her pharmacy. Please advise pt.

## 2018-07-06 NOTE — Telephone Encounter (Signed)
If patient calls back her symptoms need to be triaged by [facility] as they were the ones calling her.

## 2018-07-06 NOTE — Telephone Encounter (Signed)
  Answer Assessment - Initial Assessment Questions 1. ONSET: "When did the cough begin?"        Started coughing middle of December 2. SEVERITY: "How bad is the cough today?"      Severe - worse at. Can not sleep. 3. RESPIRATORY DISTRESS: "Describe your breathing."      No 4. FEVER: "Do you have a fever?" If so, ask: "What is your temperature, how was it measured, and when did it start?"     No 5. HEMOPTYSIS: "Are you coughing up any blood?" If so ask: "How much?" (flecks, streaks, tablespoons, etc.)     No 6. TREATMENT: "What have you done so far to treat the cough?" (e.g., meds, fluids, humidifier)     Completed antibiotic. 7. CARDIAC HISTORY: "Do you have any history of heart disease?" (e.g., heart attack, congestive heart failure)      No 8. LUNG HISTORY: "Do you have any history of lung disease?"  (e.g., pulmonary embolus, asthma, emphysema)     No 9. PE RISK FACTORS: "Do you have a history of blood clots?" (or: recent major surgery, recent prolonged travel, bedridden)     No 10. OTHER SYMPTOMS: "Do you have any other symptoms? (e.g., runny nose, wheezing, chest pain)       Nasal drainage 11. PREGNANCY: "Is there any chance you are pregnant?" "When was your last menstrual period?"       No 12. TRAVEL: "Have you traveled out of the country in the last month?" (e.g., travel history, exposures)       No  Protocols used: COUGH - ACUTE NON-PRODUCTIVE-A-AH

## 2018-07-06 NOTE — Telephone Encounter (Signed)
Patient has been advised that medication has been called in  - also advised on what medication is to try during the day.   Patient stated verbal understanding.

## 2018-07-06 NOTE — Telephone Encounter (Addendum)
Attempted to contact pt regarding symptoms; left message on Voicemail [phone]; pt last seen by Dr Billey Chang 06/29/2018; per Dr Tamela Oddi note, "Symptoms should improve over the next 2 weeks.  Patient will return or call if symptoms persist or worsen."; will out to office for final disposition.

## 2018-07-06 NOTE — Addendum Note (Signed)
Addended by: Billey Chang on: 07/06/2018 01:54 PM   Modules accepted: Orders

## 2018-07-06 NOTE — Telephone Encounter (Signed)
Should have received 10 days of antibiotics so should still have 3 more days.  I've ordered cough medication for nighttime cough with codeine.  May use Delsym cough syrup or Mucinex DM to help with congestion and coughing during the daytime.

## 2019-08-20 ENCOUNTER — Ambulatory Visit: Payer: 59 | Admitting: Physician Assistant

## 2019-08-20 ENCOUNTER — Other Ambulatory Visit: Payer: Self-pay

## 2019-08-20 ENCOUNTER — Encounter: Payer: Self-pay | Admitting: Physician Assistant

## 2019-08-20 VITALS — BP 110/80 | HR 68 | Temp 97.0°F | Resp 14 | Ht 70.0 in | Wt 166.0 lb

## 2019-08-20 DIAGNOSIS — E039 Hypothyroidism, unspecified: Secondary | ICD-10-CM | POA: Diagnosis not present

## 2019-08-20 DIAGNOSIS — K219 Gastro-esophageal reflux disease without esophagitis: Secondary | ICD-10-CM

## 2019-08-20 DIAGNOSIS — Z1231 Encounter for screening mammogram for malignant neoplasm of breast: Secondary | ICD-10-CM | POA: Insufficient documentation

## 2019-08-20 DIAGNOSIS — Z79890 Hormone replacement therapy (postmenopausal): Secondary | ICD-10-CM | POA: Diagnosis not present

## 2019-08-20 DIAGNOSIS — Z0000 Encounter for general adult medical examination without abnormal findings: Secondary | ICD-10-CM | POA: Diagnosis not present

## 2019-08-20 HISTORY — DX: Hypothyroidism, unspecified: E03.9

## 2019-08-20 LAB — CBC WITH DIFFERENTIAL/PLATELET
Basophils Absolute: 0 10*3/uL (ref 0.0–0.1)
Basophils Relative: 0.6 % (ref 0.0–3.0)
Eosinophils Absolute: 0.1 10*3/uL (ref 0.0–0.7)
Eosinophils Relative: 1.4 % (ref 0.0–5.0)
HCT: 39.6 % (ref 36.0–46.0)
Hemoglobin: 13.5 g/dL (ref 12.0–15.0)
Lymphocytes Relative: 43.5 % (ref 12.0–46.0)
Lymphs Abs: 2 10*3/uL (ref 0.7–4.0)
MCHC: 34 g/dL (ref 30.0–36.0)
MCV: 93.6 fl (ref 78.0–100.0)
Monocytes Absolute: 0.4 10*3/uL (ref 0.1–1.0)
Monocytes Relative: 7.5 % (ref 3.0–12.0)
Neutro Abs: 2.2 10*3/uL (ref 1.4–7.7)
Neutrophils Relative %: 47 % (ref 43.0–77.0)
Platelets: 282 10*3/uL (ref 150.0–400.0)
RBC: 4.24 Mil/uL (ref 3.87–5.11)
RDW: 13.1 % (ref 11.5–15.5)
WBC: 4.7 10*3/uL (ref 4.0–10.5)

## 2019-08-20 LAB — LIPID PANEL
Cholesterol: 230 mg/dL — ABNORMAL HIGH (ref 0–200)
HDL: 64.5 mg/dL (ref >39.00)
LDL Cholesterol: 150 mg/dL — ABNORMAL HIGH (ref 0–99)
NonHDL: 165.66
Total CHOL/HDL Ratio: 4
Triglycerides: 80 mg/dL (ref 0.0–149.0)
VLDL: 16 mg/dL (ref 0.0–40.0)

## 2019-08-20 LAB — COMPREHENSIVE METABOLIC PANEL
ALT: 23 U/L (ref 0–35)
AST: 24 U/L (ref 0–37)
Albumin: 4.2 g/dL (ref 3.5–5.2)
Alkaline Phosphatase: 72 U/L (ref 39–117)
BUN: 13 mg/dL (ref 6–23)
CO2: 26 mEq/L (ref 19–32)
Calcium: 9.8 mg/dL (ref 8.4–10.5)
Chloride: 103 mEq/L (ref 96–112)
Creatinine, Ser: 1.18 mg/dL (ref 0.40–1.20)
GFR: 47.57 mL/min — ABNORMAL LOW (ref >60.00)
Glucose, Bld: 82 mg/dL (ref 70–99)
Potassium: 4.3 mEq/L (ref 3.5–5.1)
Sodium: 137 mEq/L (ref 135–145)
Total Bilirubin: 0.5 mg/dL (ref 0.2–1.2)
Total Protein: 7.3 g/dL (ref 6.0–8.3)

## 2019-08-20 LAB — HEMOGLOBIN A1C: Hgb A1c MFr Bld: 5.2 % (ref 4.6–6.5)

## 2019-08-20 NOTE — Patient Instructions (Signed)
Please go to the lab for blood work.   Our office will call you with your results unless you have chosen to receive results via MyChart.  If your blood work is normal we will follow-up each year for physicals and as scheduled for chronic medical problems.  If anything is abnormal we will treat accordingly and get you in for a follow-up.  You will be contacted to schedule your mammogram. We will call with results.  Make sure to schedule an appointment for your pelvic exam and PAP smear at your earliest convenience.   It was very nice meeting you today!    Preventive Care 55-34 Years Old, Female Preventive care refers to visits with your health care provider and lifestyle choices that can promote health and wellness. This includes:  A yearly physical exam. This may also be called an annual well check.  Regular dental visits and eye exams.  Immunizations.  Screening for certain conditions.  Healthy lifestyle choices, such as eating a healthy diet, getting regular exercise, not using drugs or products that contain nicotine and tobacco, and limiting alcohol use. What can I expect for my preventive care visit? Physical exam Your health care provider will check your:  Height and weight. This may be used to calculate body mass index (BMI), which tells if you are at a healthy weight.  Heart rate and blood pressure.  Skin for abnormal spots. Counseling Your health care provider may ask you questions about your:  Alcohol, tobacco, and drug use.  Emotional well-being.  Home and relationship well-being.  Sexual activity.  Eating habits.  Work and work Statistician.  Method of birth control.  Menstrual cycle.  Pregnancy history. What immunizations do I need?  Influenza (flu) vaccine  This is recommended every year. Tetanus, diphtheria, and pertussis (Tdap) vaccine  You may need a Td booster every 10 years. Varicella (chickenpox) vaccine  You may need this if you  have not been vaccinated. Zoster (shingles) vaccine  You may need this after age 55. Measles, mumps, and rubella (MMR) vaccine  You may need at least one dose of MMR if you were born in 1957 or later. You may also need a second dose. Pneumococcal conjugate (PCV13) vaccine  You may need this if you have certain conditions and were not previously vaccinated. Pneumococcal polysaccharide (PPSV23) vaccine  You may need one or two doses if you smoke cigarettes or if you have certain conditions. Meningococcal conjugate (MenACWY) vaccine  You may need this if you have certain conditions. Hepatitis A vaccine  You may need this if you have certain conditions or if you travel or work in places where you may be exposed to hepatitis A. Hepatitis B vaccine  You may need this if you have certain conditions or if you travel or work in places where you may be exposed to hepatitis B. Haemophilus influenzae type b (Hib) vaccine  You may need this if you have certain conditions. Human papillomavirus (HPV) vaccine  If recommended by your health care provider, you may need three doses over 6 months. You may receive vaccines as individual doses or as more than one vaccine together in one shot (combination vaccines). Talk with your health care provider about the risks and benefits of combination vaccines. What tests do I need? Blood tests  Lipid and cholesterol levels. These may be checked every 5 years, or more frequently if you are over 4 years old.  Hepatitis C test.  Hepatitis B test. Screening  Lung cancer  screening. You may have this screening every year starting at age 55 if you have a 30-pack-year history of smoking and currently smoke or have quit within the past 15 years.  Colorectal cancer screening. All adults should have this screening starting at age 55 and continuing until age 44. Your health care provider may recommend screening at age 55 if you are at increased risk. You will have  tests every 1-10 years, depending on your results and the type of screening test.  Diabetes screening. This is done by checking your blood sugar (glucose) after you have not eaten for a while (fasting). You may have this done every 1-3 years.  Mammogram. This may be done every 55-2 years. Talk with your health care provider about when you should start having regular mammograms. This may depend on whether you have a family history of breast cancer.  BRCA-related cancer screening. This may be done if you have a family history of breast, ovarian, tubal, or peritoneal cancers.  Pelvic exam and Pap test. This may be done every 3 years starting at age 55. Starting at age 55, this may be done every 5 years if you have a Pap test in combination with an HPV test. Other tests  Sexually transmitted disease (STD) testing.  Bone density scan. This is done to screen for osteoporosis. You may have this scan if you are at high risk for osteoporosis. Follow these instructions at home: Eating and drinking  Eat a diet that includes fresh fruits and vegetables, whole grains, lean protein, and low-fat dairy.  Take vitamin and mineral supplements as recommended by your health care provider.  Do not drink alcohol if: ? Your health care provider tells you not to drink. ? You are pregnant, may be pregnant, or are planning to become pregnant.  If you drink alcohol: ? Limit how much you have to 0-1 drink a day. ? Be aware of how much alcohol is in your drink. In the [place] one drink equals one 12 oz bottle of beer (355 mL), one 5 oz glass of wine (148 mL), or one 1 oz glass of hard liquor (44 mL). Lifestyle  Take daily care of your teeth and gums.  Stay active. Exercise for at least 30 minutes on 5 or more days each week.  Do not use any products that contain nicotine or tobacco, such as cigarettes, e-cigarettes, and chewing tobacco. If you need help quitting, ask your health care provider.  If you are  sexually active, practice safe sex. Use a condom or other form of birth control (contraception) in order to prevent pregnancy and STIs (sexually transmitted infections).  If told by your health care provider, take low-dose aspirin daily starting at age 55. What's next?  Visit your health care provider once a year for a well check visit.  Ask your health care provider how often you should have your eyes and teeth checked.  Stay up to date on all vaccines. This information is not intended to replace advice given to you by your health care provider. Make sure you discuss any questions you have with your health care provider. Document Revised: 02/12/2018 Document Reviewed: 02/12/2018 Elsevier Patient Education  2020 [employer].

## 2019-08-20 NOTE — Progress Notes (Signed)
Patient presents to clinic today for annual exam. She is transferring care from Dr. Jonni Sanger who is no longer at this practice. Last visit at [facility] 06/29/2018.  Patient is fasting for labs.  Is currently seen by a hormone specialist for her thyroid issues, Margarita Mail, who prescribes her thyroid medication, progesterone and Trulicitiy. Has a follow-up every quarter. Had pellets implanted in January.   Health Maintenance: Immunizations -- Declines flu shot. Tetanus up-to-date.  Colonoscopy -- UTD  Mammogram -- Due per patient as she ran out of insurance. Denies concerns. PAP -- Due. Patient would like to schedule here in office. Denies history of abnormal PAP.  Past Medical History:  Diagnosis Date  . Diverticulosis 02/19/2018   By colonoscopy 2016  . History of endometrial cancer 02/19/2018  . Hx of concussion   . Kidney disease   . Thyroid disease     Past Surgical History:  Procedure Laterality Date  . ENDOMETRIAL ABLATION    . SHOULDER SURGERY    . TUBAL LIGATION      Current Outpatient Medications on File Prior to Visit  Medication Sig Dispense Refill  . docusate sodium (COLACE) 100 MG capsule Take 100 mg by mouth 2 (two) times daily.    Marland Kitchen EPINEPHrine 0.3 mg/0.3 mL IJ SOAJ injection Inject 0.3 mg into the muscle as needed for anaphylaxis.    . famotidine (PEPCID) 20 MG tablet Take 20 mg by mouth 2 (two) times daily.    . fluticasone (FLONASE) 50 MCG/ACT nasal spray Place 2 sprays into both nostrils daily. 16 g 6  . loratadine (CLARITIN) 10 MG tablet Take 10 mg by mouth daily.    . NP THYROID 60 MG tablet Take 60 mg by mouth daily.    . Prenatal Vit-Fe Fumarate-FA (M-VIT) tablet Take 1 tablet by mouth daily.    . progesterone (PROMETRIUM) 100 MG capsule Take 100 mg by mouth at bedtime.    . TRULICITY 1.5 UK/0.2RK SOPN Inject 1.5 mg into the skin once a week.    . vitamin B-12 (CYANOCOBALAMIN) 1000 MCG tablet Take 1,000 mcg by mouth daily.    . vitamin k 100 MCG tablet Take 100  mcg by mouth daily.     No current facility-administered medications on file prior to visit.    Allergies  Allergen Reactions  . Flagyl [Metronidazole] Anaphylaxis  . Iodine Anaphylaxis  . Tramadol Hcl Other (See Comments)    Showed signs of stroke    Family History  Problem Relation Age of Onset  . Testicular cancer Brother   . Healthy Daughter   . Healthy Son   . Hypotension Maternal Grandmother   . Aortic dissection Maternal Grandmother     Social History   Socioeconomic History  . Marital status: Married    Spouse name: Not on file  . Number of children: Not on file  . Years of education: Not on file  . Highest education level: Not on file  Occupational History  . Not on file  Tobacco Use  . Smoking status: Never Smoker  . Smokeless tobacco: Never Used  Substance and Sexual Activity  . Alcohol use: Yes    Comment: occasionally   . Drug use: Never  . Sexual activity: Yes    Birth control/protection: Surgical  Other Topics Concern  . Not on file  Social History Narrative  . Not on file   Social Determinants of Health   Financial Resource Strain:   . Difficulty of Paying Living Expenses:  Not on file  Food Insecurity:   . Worried About Charity fundraiser in the Last Year: Not on file  . Ran Out of Food in the Last Year: Not on file  Transportation Needs:   . Lack of Transportation (Medical): Not on file  . Lack of Transportation (Non-Medical): Not on file  Physical Activity:   . Days of Exercise per Week: Not on file  . Minutes of Exercise per Session: Not on file  Stress:   . Feeling of Stress : Not on file  Social Connections:   . Frequency of Communication with Friends and Family: Not on file  . Frequency of Social Gatherings with Friends and Family: Not on file  . Attends Religious Services: Not on file  . Active Member of Clubs or Organizations: Not on file  . Attends Archivist Meetings: Not on file  . Marital Status: Not on file    Intimate Partner Violence:   . Fear of Current or Ex-Partner: Not on file  . Emotionally Abused: Not on file  . Physically Abused: Not on file  . Sexually Abused: Not on file   Review of Systems  Constitutional: Negative for fever and weight loss.  HENT: Negative for ear discharge, ear pain, hearing loss and tinnitus.   Eyes: Negative for blurred vision, double vision, photophobia and pain.  Respiratory: Negative for cough and shortness of breath.   Cardiovascular: Negative for chest pain and palpitations.  Gastrointestinal: Negative for abdominal pain, blood in stool, constipation, diarrhea, heartburn, melena, nausea and vomiting.  Genitourinary: Negative for dysuria, flank pain, frequency, hematuria and urgency.  Musculoskeletal: Negative for falls.  Neurological: Negative for dizziness, loss of consciousness and headaches.  Endo/Heme/Allergies: Negative for environmental allergies.  Psychiatric/Behavioral: Negative for depression, hallucinations, substance abuse and suicidal ideas. The patient is not nervous/anxious and does not have insomnia.     Resp 14   Ht 5' 10"  (1.778 m)   Wt 166 lb (75.3 kg)   BMI 23.82 kg/m   Physical Exam Vitals reviewed.  HENT:     Head: Normocephalic and atraumatic.     Right Ear: Tympanic membrane, ear canal and external ear normal.     Left Ear: Tympanic membrane, ear canal and external ear normal.     Nose: Nose normal. No mucosal edema.     Mouth/Throat:     Pharynx: Uvula midline. No oropharyngeal exudate or posterior oropharyngeal erythema.  Eyes:     Conjunctiva/sclera: Conjunctivae normal.     Pupils: Pupils are equal, round, and reactive to light.  Neck:     Thyroid: No thyromegaly.  Cardiovascular:     Rate and Rhythm: Normal rate and regular rhythm.     Heart sounds: Normal heart sounds.  Pulmonary:     Effort: Pulmonary effort is normal. No respiratory distress.     Breath sounds: Normal breath sounds. No wheezing or rales.   Abdominal:     General: Bowel sounds are normal. There is no distension.     Palpations: Abdomen is soft. There is no mass.     Tenderness: There is no abdominal tenderness. There is no guarding or rebound.  Musculoskeletal:     Cervical back: Neck supple.  Lymphadenopathy:     Cervical: No cervical adenopathy.  Skin:    General: Skin is warm and dry.     Findings: No rash.  Neurological:     Mental Status: She is alert and oriented to person, place, and time.  Cranial Nerves: No cranial nerve deficit.     Assessment/Plan: 1. Visit for preventive health examination Depression screen negative. Health Maintenance reviewed.  Mammogram ordered. Preventive schedule discussed and handout given in AVS. Will obtain fasting labs today.  - Comprehensive metabolic panel - CBC with Differential/Platelet - Hemoglobin A1c - Lipid panel  2. On hormone replacement therapy Managed by Dr. Donnal Moat.  Will defer continued management and lab monitoring to specialist.  3. Hypothyroidism (acquired) Continue management per specialist.  4. Gastroesophageal reflux disease without esophagitis Stable.  Continue current regimen.  This visit occurred during the SARS-CoV-2 public health emergency.  Safety protocols were in place, including screening questions prior to the visit, additional usage of staff PPE, and extensive cleaning of exam room while observing appropriate contact time as indicated for disinfecting solutions.     Leeanne Rio, PA-C

## 2019-08-23 ENCOUNTER — Other Ambulatory Visit: Payer: Self-pay

## 2019-08-23 ENCOUNTER — Encounter: Payer: Self-pay | Admitting: Physician Assistant

## 2019-08-23 ENCOUNTER — Other Ambulatory Visit (HOSPITAL_COMMUNITY)
Admission: RE | Admit: 2019-08-23 | Discharge: 2019-08-23 | Disposition: A | Discharge status: Home / Self Care | Payer: 59 | Source: Ambulatory Visit | Attending: Physician Assistant | Admitting: Physician Assistant

## 2019-08-23 ENCOUNTER — Ambulatory Visit (INDEPENDENT_AMBULATORY_CARE_PROVIDER_SITE_OTHER): Payer: 59 | Admitting: Physician Assistant

## 2019-08-23 VITALS — BP 110/70 | HR 81 | Temp 98.7°F | Resp 16 | Ht 70.0 in | Wt 166.0 lb

## 2019-08-23 DIAGNOSIS — Z1231 Encounter for screening mammogram for malignant neoplasm of breast: Secondary | ICD-10-CM

## 2019-08-23 DIAGNOSIS — Z124 Encounter for screening for malignant neoplasm of cervix: Secondary | ICD-10-CM | POA: Diagnosis present

## 2019-08-23 NOTE — Patient Instructions (Signed)
Preventing Cervical Cancer Cervical cancer is cancer that grows on the cervix. The cervix is at the bottom of the uterus. It connects the uterus to the vagina. The uterus is where a baby develops during pregnancy. Cancer occurs when cells become abnormal and start to grow out of control. If cervical cancer is not found early, it can spread and become dangerous. Cervical cancer cannot always be prevented, but you can take steps to lower your risk of developing this condition. How can this condition affect me? Cervical cancer grows slowly and may not cause any symptoms at first. Over time, the cancer can grow deep into the cervix tissue and spread to other areas. This may take years, and it may happen without you knowing about it. If it is found early, cervical cancer can be treated effectively. If the cancer has grown deep into your cervix or has spread, it will be more difficult to treat. Most cases of cervical cancer are caused by an STI (sexually transmitted infection) called human papillomavirus (HPV). One way to reduce your risk of cervical cancer is to take steps to avoid infection with the HPV virus. Getting regular Pap tests is also important because this can help identify changes in cells that could lead to cancer. Your chances of getting this disease can also be reduced by making certain lifestyle changes. What can increase my risk? You are more likely to develop this condition if:  You have certain things in your sexual history, such as: ? Having a sexually transmitted viral infection. These include chlamydia and herpes. ? Having more than one sexual partner, or having sex with someone who has more than one sexual partner. ? Not using condoms during sex. ? Having been sexually active before the age of 28.  Your mother took a medicine called diethylstilbestrol (DES) while pregnant with you, causing you to be exposed to this medicine before birth.  Your mother or sister has had cervical  cancer.  You are between the ages of 42-50.  You have or have had certain other medical conditions, such as: ? Previous cancer of the vagina or vulva. ? A weakened body defense system (immune system). ? A history of dysplasia of the cervix.  You use oral contraceptives, also called birth control pills.  You smoke or breathe in secondhand smoke. What actions can I take to prevent cervical cancer? Preventing HPV infection   Ask your health care provider about getting the HPV vaccine. If you are 23 years old or younger, you may need to get this vaccine, which is given in three doses over 6 months. This vaccine protects against the types of HPV that could cause cancer.  Limit the number of people you have sex with. Also avoid having sex with people who have had many sex partners.  Use a latex condom every time you have sex. Getting Pap tests Get Pap tests regularly, starting at age 46. Talk with your health care provider about how often you need these tests. Having regular Pap tests will help identify changes in cells that could lead to cancer. Steps can then be taken to prevent cancer from developing.  Most women who are 57?55 years of age should have a Pap test every 3 years.  Most women who are 50?56 years of age should have a Pap test in combination with an HPV test every 5 years.  Women with a higher risk of cervical cancer, such as those with a weakened immune system or those who were  exposed to DES medicine before birth, may need more frequent testing. Making other lifestyle changes   Do not use any products that contain nicotine or tobacco, such as cigarettes, e-cigarettes, and chewing tobacco. If you need help quitting, ask your health care provider.  Eat a healthy diet that includes at least 5 servings of fruits and vegetables every day.  Lose weight if you are overweight. Where to find support Talk with your health care provider, school nurse, or local health department  for guidance about screening and vaccination. Some children and teens may be able to get the HPV vaccine free of charge through the U.[place]. government's Vaccines for Children [facility]) program. Other places that provide vaccinations include:  Public health clinics. Check with your local health department.  [place], where you would pay only what you can afford. To find one near you, check this website: http://lyons.com/  [place]. These are part of a program for Medicare and Medicaid patients who live in rural areas. The National Breast and Cervical Cancer Early Detection Program also provides breast and cervical cancer screenings and diagnostic services to low-income, uninsured, and underinsured women. Cervical cancer can be passed down through families. Talk with your health care provider or a genetic counselor to learn more about genetic testing for cancer. Where to find more information Learn more about cervical cancer from:  [employer]: www.acog.org  American Cancer Society: www.cancer.org  Centers for Disease Control and Prevention: http://www.wolf.info/ Contact a health care provider if you have:  Pelvic pain.  Unusual discharge or bleeding from your vagina. Summary  Cervical cancer is cancer that grows on the cervix. The cervix is at the bottom of the uterus.  Ask your health care provider about getting the HPV vaccine.  Be sure to get regular Pap tests as recommended by your health care provider.  See your health care provider right away if you have any pelvic pain or unusual discharge or bleeding from your vagina. This information is not intended to replace advice given to you by your health care provider. Make sure you discuss any questions you have with your health care provider. Document Revised: 01/04/2019 Document Reviewed: 01/04/2019 Elsevier Patient Education  [place].

## 2019-08-23 NOTE — Progress Notes (Signed)
    SUBJECTIVE:  54 y.o. female for routine Pap and breast examination. Denies concerns at today's visit. Mammogram scheduled for next week.   Current Outpatient Medications  Medication Sig Dispense Refill  . docusate sodium (COLACE) 100 MG capsule Take 100 mg by mouth 2 (two) times daily.    Marland Kitchen EPINEPHrine 0.3 mg/0.3 mL IJ SOAJ injection Inject 0.3 mg into the muscle as needed for anaphylaxis.    . famotidine (PEPCID) 20 MG tablet Take 20 mg by mouth 2 (two) times daily.    . fluticasone (FLONASE) 50 MCG/ACT nasal spray Place 2 sprays into both nostrils daily. 16 g 6  . loratadine (CLARITIN) 10 MG tablet Take 10 mg by mouth daily.    . NP THYROID 60 MG tablet Take 60 mg by mouth daily.    . Prenatal Vit-Fe Fumarate-FA (M-VIT) tablet Take 1 tablet by mouth daily.    . progesterone (PROMETRIUM) 100 MG capsule Take 100 mg by mouth at bedtime.    . TRULICITY 1.5 YD/7.4JO SOPN Inject 1.5 mg into the skin once a week.    . vitamin B-12 (CYANOCOBALAMIN) 1000 MCG tablet Take 1,000 mcg by mouth daily.    . vitamin k 100 MCG tablet Take 100 mcg by mouth daily.     No current facility-administered medications for this visit.   Allergies: Flagyl [metronidazole], Iodine, and Tramadol hcl  No LMP recorded. Patient has had an ablation.  ROS:  Feeling well. No dyspnea or chest pain on exertion.  No abdominal pain, change in bowel habits, black or bloody stools.  No urinary tract symptoms. GYN ROS: no breast pain or new or enlarging lumps on self exam. No neurological complaints.  OBJECTIVE:  The patient appears well, alert, oriented x 3, in no distress. BP 110/70   Pulse 81   Temp 98.7 F (37.1 C) (Temporal)   Resp 16   Ht 5' 10"  (1.778 m)   Wt 166 lb (75.3 kg)   SpO2 98%   BMI 23.82 kg/m  ENT normal.  Neck supple. No adenopathy or thyromegaly. PERLA. Lungs are clear, good air entry, no wheezes, rhonchi or rales. S1 and S2 normal, no murmurs, regular rate and rhythm. Abdomen soft without  tenderness, guarding, mass or organomegaly. Extremities show no edema, normal peripheral pulses. Neurological is normal, no focal findings.  BREAST EXAM: breasts appear normal, no suspicious masses, no skin or nipple changes or axillary nodes  PELVIC EXAM: normal external genitalia, vulva, vagina, cervix, uterus and adnexa, exam chaperoned by Eduard Clos, CMA  ASSESSMENT:  well woman  PLAN:  1. Cervical cancer screening Examination today without abnormal finding. PAP w HPV any interpretation obtained and sent to lab.  - Cytology - PAP( [place])  2. Encounter for screening mammogram for malignant neoplasm of breast Breast exam without concerning findings. Mammogram scheduled.   This visit occurred during the SARS-CoV-2 public health emergency.  Safety protocols were in place, including screening questions prior to the visit, additional usage of staff PPE, and extensive cleaning of exam room while observing appropriate contact time as indicated for disinfecting solutions.

## 2019-08-26 LAB — CYTOLOGY - PAP
Comment: NEGATIVE
Diagnosis: NEGATIVE
High risk HPV: NEGATIVE

## 2019-09-24 ENCOUNTER — Telehealth: Payer: Self-pay | Admitting: Physician Assistant

## 2019-09-24 ENCOUNTER — Ambulatory Visit
Admission: RE | Admit: 2019-09-24 | Discharge: 2019-09-24 | Disposition: A | Discharge status: Home / Self Care | Payer: 59 | Source: Ambulatory Visit | Attending: Physician Assistant | Admitting: Physician Assistant

## 2019-09-24 ENCOUNTER — Other Ambulatory Visit: Payer: Self-pay

## 2019-09-24 DIAGNOSIS — Z1231 Encounter for screening mammogram for malignant neoplasm of breast: Secondary | ICD-10-CM

## 2019-09-24 DIAGNOSIS — Z800 Family history of malignant neoplasm of digestive organs: Secondary | ICD-10-CM

## 2019-09-24 DIAGNOSIS — Z79890 Hormone replacement therapy (postmenopausal): Secondary | ICD-10-CM

## 2019-09-24 DIAGNOSIS — K589 Irritable bowel syndrome without diarrhea: Secondary | ICD-10-CM

## 2019-09-24 NOTE — Telephone Encounter (Signed)
I haven't seen any documents on patient. Please advise

## 2019-09-24 NOTE — Telephone Encounter (Signed)
Pt calling to make sure you received lab results from [facility] office.   She would like [place] to prescribe pills instead of going to the other office now that she has insurance.  Please call pt back and advise. She stated that she has a copy of the lab work that she can bring by if you haven't received it yet.

## 2019-09-26 NOTE — Telephone Encounter (Signed)
Have not received records. Need to send another request. If she wants to drop by lab work that would be beneficial. I am happy taking over most things but I do not do hormone replacement therapy so would have her see Endo or GYN for Progesterone, etc.

## 2019-09-27 NOTE — Telephone Encounter (Signed)
Called patient and informed her that we have not received her records  from Dr. Margarita Mail. She states that she will drop a copy off to our office in the morning. Patient was also informed that you don't do hormone replacement therapy and she voiced understanding and replied that it would be okay with her to be referred out. Patient states she also has some GI issues (IBS, diverticulitis, family hx of esophageal cancer) that she would like a referral for. She states she has seen GI in the past 4-5 years but would like to be referred somewhere local.

## 2019-09-28 ENCOUNTER — Encounter: Payer: Self-pay | Admitting: Gastroenterology

## 2019-09-28 NOTE — Telephone Encounter (Signed)
Referrals have been placed.

## 2019-11-02 ENCOUNTER — Encounter: Payer: Self-pay | Admitting: Gastroenterology

## 2019-11-02 ENCOUNTER — Other Ambulatory Visit: Payer: Self-pay

## 2019-11-02 ENCOUNTER — Ambulatory Visit (INDEPENDENT_AMBULATORY_CARE_PROVIDER_SITE_OTHER): Payer: 59 | Admitting: Gastroenterology

## 2019-11-02 VITALS — BP 100/78 | HR 80 | Ht 70.0 in | Wt 158.4 lb

## 2019-11-02 DIAGNOSIS — R6881 Early satiety: Secondary | ICD-10-CM | POA: Diagnosis not present

## 2019-11-02 DIAGNOSIS — K5900 Constipation, unspecified: Secondary | ICD-10-CM

## 2019-11-02 DIAGNOSIS — R630 Anorexia: Secondary | ICD-10-CM

## 2019-11-02 DIAGNOSIS — Z800 Family history of malignant neoplasm of digestive organs: Secondary | ICD-10-CM

## 2019-11-02 DIAGNOSIS — K219 Gastro-esophageal reflux disease without esophagitis: Secondary | ICD-10-CM | POA: Diagnosis not present

## 2019-11-02 DIAGNOSIS — Z01818 Encounter for other preprocedural examination: Secondary | ICD-10-CM

## 2019-11-02 MED ORDER — SUCRALFATE 1 G PO TABS: 1.0000 g | ORAL_TABLET | Freq: Four times a day (QID) | ORAL | 2 refills | Status: DC | PRN

## 2019-11-02 MED ORDER — SUCRALFATE 1 GM/10ML PO SUSP: 1.0000 g | Freq: Four times a day (QID) | ORAL | 1 refills | Status: DC | PRN

## 2019-11-02 MED ORDER — POLYETHYLENE GLYCOL 3350 17 G PO PACK: 17.0000 g | PACK | ORAL | 0 refills | Status: DC

## 2019-11-02 NOTE — Patient Instructions (Addendum)
If you are age 55 or older, your body mass index should be between 23-30. Your Body mass index is 22.72 kg/m. If this is out of the aforementioned range listed, please consider follow up with your Primary Care Provider.  If you are age 48 or younger, your body mass index should be between 19-25. Your Body mass index is 22.72 kg/m. If this is out of the aformentioned range listed, please consider follow up with your Primary Care Provider.   You have been scheduled for an endoscopy. Please follow written instructions given to you at your visit today. If you use inhalers (even only as needed), please bring them with you on the day of your procedure.   Take Pepcid 20 mg Twice daily.  We have sent the following medications to your pharmacy for you to pick up at your convenience: Carafate suspension: Take every 6 hours as needed  Please purchase the following medications over the counter and take as directed: Miralax: Take every other day  We would like to get your records from your colonoscopy.  Please provide [place] with the physician that did your procedure and the location of the endoscopy center so that we may request your records:  [phone].  Thank you.  Thank you for entrusting me with your care and for choosing [facility], Dr. [place] Cellar     .

## 2019-11-02 NOTE — Progress Notes (Signed)
Replaced carafate suspension with carafate tablets due to lack of insurance coverage.

## 2019-11-02 NOTE — Progress Notes (Signed)
HPI :  54 y/o female with a history of family history of esophageal cancer, GERD, diverticulosis, history of endometrial cancer, referred by Raiford Noble PA-C for family history of esophageal cancer, GERD, IBS.  Patient states she has had longstanding symptoms of GERD for years.  She feels pyrosis frequently after she eats.  She can also have nocturnal symptoms with this as well as regurgitation.  She often sleeps with 3 pillows of the head of her bed and an upright position.  Over the past 5 weeks or so she has had much worsening of her symptoms.  She has had some nausea occasionally but no vomiting.  Decreased appetite, early satiety have been bothering her.  The reflux symptoms have been poorly controlled despite taking famotidine 20 mg once daily to twice daily.  The famotidine can help but does not resolve it and she is having frequent breakthrough despite once daily dosing.  She denies any dysphagia.  She endorses a trial of omeprazole in the past which caused throat closing.  She has been on Nexium and Dexilant in the past which have helped her symptoms however she developed stage III chronic kidney disease and blames this on the Dexilant use at the time.  She has been hoping to avoid PPIs in light of this issue.  Her last GFR is 47.  Her brother was recently diagnosed with esophageal cancer and ultimately passed of it at age 39.  She has had a prior EGD in 2016, we do not have the report of this available, she states she was told she had some " scar tissue and abrasions", no reported Barrett's esophagus.  She denies any NSAID use.  She thinks she may have lost a few pounds of weight recently.  She eats dinner about 5:55 PM and goes to bed about 9:51 PM.  She avoids eating prior to bedtime  She otherwise endorses constipation that has been bothering her for a long time.  She has a bowel movement every few days and passes hard small stools.  She is use a stool softener in the past few times a day  which did not help at all.  She is used fiber in the past which has not helped made her quite bloated.  She has had MiraLAX in the past and states after multiple doses this caused diarrhea and she did not like the way she felt with that.  She denies any blood in her stools.  No abdominal pains.  She denies any family history of colon cancer.  No tobacco use.  She had colonoscopy reportedly in 2016 as well which she thinks was normal, no report of that available, she does not think she had any polyps removed.   Past Medical History:  Diagnosis Date  . Diverticulosis 02/19/2018   By colonoscopy 2016  . GERD (gastroesophageal reflux disease)   . History of endometrial cancer 02/19/2018  . Hx of concussion   . IBS (irritable bowel syndrome)   . Kidney disease   . Thyroid disease      Past Surgical History:  Procedure Laterality Date  . ENDOMETRIAL ABLATION    . SHOULDER SURGERY Left   . TUBAL LIGATION     Family History  Problem Relation Age of Onset  . Testicular cancer Brother   . Esophageal cancer Brother   . Healthy Daughter   . Healthy Son   . Hypotension Maternal Grandmother   . Aortic dissection Maternal Grandmother   . Heart disease Maternal  Grandfather   . Ovarian cancer Paternal Great-grandmother    Social History   Tobacco Use  . Smoking status: Never Smoker  . Smokeless tobacco: Never Used  Substance Use Topics  . Alcohol use: Yes    Comment: occasionally  per week  . Drug use: Never   Current Outpatient Medications  Medication Sig Dispense Refill  . docusate sodium (COLACE) 100 MG capsule Take 100 mg by mouth 2 (two) times daily.    Marland Kitchen EPINEPHrine 0.3 mg/0.3 mL IJ SOAJ injection Inject 0.3 mg into the muscle as needed for anaphylaxis.    . famotidine (PEPCID) 20 MG tablet Take 20 mg by mouth 2 (two) times daily.    . fluticasone (FLONASE) 50 MCG/ACT nasal spray Place 2 sprays into both nostrils daily. 16 g 6  . loratadine (CLARITIN) 10 MG tablet Take 10 mg by  mouth daily.    . Multiple Vitamins-Minerals (MULTIVITAMIN ADULTS 50+ PO) Take 1 tablet by mouth daily.    . NP THYROID 60 MG tablet Take 60 mg by mouth daily.    . progesterone (PROMETRIUM) 100 MG capsule Take 100 mg by mouth at bedtime.    . TRULICITY 1.5 OY/7.7AJ SOPN Inject 1.5 mg into the skin once a week.    . vitamin B-12 (CYANOCOBALAMIN) 1000 MCG tablet Take 1,000 mcg by mouth daily.    . vitamin k 100 MCG tablet Take 100 mcg by mouth daily.    . polyethylene glycol (MIRALAX) 17 g packet Take 17 g by mouth every other day. 14 each 0  . sucralfate (CARAFATE) 1 GM/10ML suspension Take 10 mLs (1 g total) by mouth every 6 (six) hours as needed. 420 mL 1   No current facility-administered medications for this visit.   Allergies  Allergen Reactions  . Flagyl [Metronidazole] Anaphylaxis  . Iodine Anaphylaxis  . Tramadol Hcl Other (See Comments)    Showed signs of stroke     Review of Systems: All systems reviewed and negative except where noted in HPI.   Lab Results  Component Value Date   WBC 4.7 08/20/2019   HGB 13.5 08/20/2019   HCT 39.6 08/20/2019   MCV 93.6 08/20/2019   PLT 282.0 08/20/2019    Lab Results  Component Value Date   CREATININE 1.18 08/20/2019   BUN 13 08/20/2019   NA 137 08/20/2019   K 4.3 08/20/2019   CL 103 08/20/2019   CO2 26 08/20/2019    Lab Results  Component Value Date   ALT 23 08/20/2019   AST 24 08/20/2019   ALKPHOS 72 08/20/2019   BILITOT 0.5 08/20/2019     Physical Exam: BP 100/78 (BP Location: Left Arm, Patient Position: Sitting, Cuff Size: Normal)   Pulse 80   Ht 5' 10"  (1.778 m) Comment: height measured without shoes  Wt 158 lb 6 oz (71.8 kg)   BMI 22.72 kg/m  Constitutional: Pleasant,well-developed, female in no acute distress. HEENT: Normocephalic and atraumatic. Conjunctivae are normal. No scleral icterus. Neck supple.  Cardiovascular: Normal rate, regular rhythm.  Pulmonary/chest: Effort normal and breath sounds  normal. No wheezing, rales or rhonchi. Abdominal: Soft, nondistended, nontender. . There are no masses palpable. . Extremities: no edema Lymphadenopathy: No cervical adenopathy noted. Neurological: Alert and oriented to person place and time. Skin: Skin is warm and dry. No rashes noted. Psychiatric: Normal mood and affect. Behavior is normal.   ASSESSMENT AND PLAN: 54 year old female here for new patient assessment of the following:  GERD / early satiety /  poor appetite / family history of esophageal cancer - multiple upper tract symptoms, most problematic for her appears to be the reflux.  I discussed options with her.  This is a difficult situation as she has stage III chronic kidney disease which she thinks has been attributed to PPI use in the past, so she wants to avoid that.  She is clearly having significant breakthrough despite Pepcid once daily.  I recommend initially that we increase her Pepcid to twice a day and can add some liquid Carafate as needed especially prior to bedtime to minimize the symptoms.  Ultimately given her intolerance of PPI her best option may be interventional such as endoscopic TIF or Nissen fundoplication.  Given the symptoms I am recommending an upper endoscopy to ensure no Barrett's esophagus, assess for size of hiatal hernia, and assess her candidacy for TIF.  If she is a candidate for TIF I think that might be her best option for long-term management, would have her see Dr. Bryan Lemma for consult.  I discussed risks and benefits of endoscopy and anesthesia and she want to proceed.  Further recommendations pending results.  We will see how she does in the interim on higher dose Pepcid and Carafate, doing off on PPI due to CKD.  I offered her a trial of Zofran otherwise for now which she declined.  We will obtain report of her prior EGD for her records.  Constipation - discussed options.  Colonoscopy sounds like it is up-to-date but will get report of that to confirm  findings.  Sounds like higher dose MiraLAX has led to diarrhea, after discussion of option recommend low-dose MiraLAX, perhaps take a dose every other day and see how she does with this.  She can titrate up or down as needed.  If this does not work for her she should let me know we can discuss other options.  She agreed  Lane Cellar, MD [facility]  CC: Brunetta Jeans, PA-C

## 2019-11-04 ENCOUNTER — Encounter: Payer: Self-pay | Admitting: Gastroenterology

## 2019-11-05 ENCOUNTER — Telehealth: Payer: Self-pay | Admitting: Gastroenterology

## 2019-11-05 ENCOUNTER — Other Ambulatory Visit: Payer: Self-pay

## 2019-11-05 NOTE — Telephone Encounter (Signed)
Derick with pt's pharmacy called to inform that Sulcrafate suspension needs PA. However, in tablet form is covered. Carafate tablets is non-formulary. Pls call Derick with preferred option. His phone is [phone].

## 2019-11-05 NOTE — Telephone Encounter (Signed)
I have spoken to [place] with patient's insurance to advise that patient will be getting sucralfate tablet. He verbalizes understanding. Script has already been sent to patient's pharmacy for sucralfate tablet and patient's pharmacy confirms by phone that they have gotten this prescription.

## 2019-11-08 ENCOUNTER — Ambulatory Visit (INDEPENDENT_AMBULATORY_CARE_PROVIDER_SITE_OTHER): Payer: Self-pay

## 2019-11-08 ENCOUNTER — Other Ambulatory Visit: Payer: Self-pay

## 2019-11-08 DIAGNOSIS — Z1159 Encounter for screening for other viral diseases: Secondary | ICD-10-CM

## 2019-11-09 ENCOUNTER — Encounter: Payer: Self-pay | Admitting: Internal Medicine

## 2019-11-09 ENCOUNTER — Ambulatory Visit (INDEPENDENT_AMBULATORY_CARE_PROVIDER_SITE_OTHER): Payer: 59 | Admitting: Internal Medicine

## 2019-11-09 ENCOUNTER — Other Ambulatory Visit: Payer: Self-pay

## 2019-11-09 VITALS — BP 128/88 | HR 79 | Temp 97.8°F | Ht 70.0 in | Wt 158.0 lb

## 2019-11-09 DIAGNOSIS — R634 Abnormal weight loss: Secondary | ICD-10-CM | POA: Diagnosis not present

## 2019-11-09 DIAGNOSIS — E039 Hypothyroidism, unspecified: Secondary | ICD-10-CM

## 2019-11-09 DIAGNOSIS — Z780 Asymptomatic menopausal state: Secondary | ICD-10-CM

## 2019-11-09 LAB — T4, FREE: Free T4: 0.64 ng/dL (ref 0.60–1.60)

## 2019-11-09 LAB — SARS CORONAVIRUS 2 (TAT 6-24 HRS): SARS Coronavirus 2: NEGATIVE

## 2019-11-09 LAB — TSH: TSH: 0.81 u[IU]/mL (ref 0.35–4.50)

## 2019-11-09 MED ORDER — NP THYROID 60 MG PO TABS: 60.0000 mg | ORAL_TABLET | Freq: Every day | ORAL | 3 refills | Status: DC

## 2019-11-09 NOTE — Progress Notes (Signed)
Name: Candace Allen  MRN/ DOB: [id number], 11/02/1965    Age/ Sex: 54 y.o., female    PCP: Candace Allen   Reason for Endocrinology Evaluation: Hypothyroidism     Date of Initial Endocrinology Evaluation: 11/09/2019     HPI: Candace Allen is a 54 y.o. female with a past medical history of IBS . The patient presented for initial endocrinology clinic visit on 11/09/2019 for consultative assistance with her Hypothyroidism.   Pt has been diagnosed with hypothyroidism through integrative health specialist back in 11/2018 She presented there with hot flashes, weight gain , irritability and mood swings as well as fatigue and tiredness. She was started on armour thyroid and has been on this dose ever since. She was also started on Trulicity as well as a hormonal cocktail that includes testosterone.   Lab records not available   She initially did not notice any improvement but since starting to ride her bike a couple of months ago she noticed improvement in stress level, weight loss and overall general well being.   She was put on  hip pellets (?estrogen and testosterone ) In 06/2019 that would last for ~ 3 month  She is also on Progesterone 100 mg daily   She does have hx of endometrial ablation, pt tells me they found tumors?  No FH of breast cancer or ovarian cancer    Pap smear negative 08/23/2019 Mammo negative 09/24/2019  HISTORY:  Past Medical History:  Past Medical History:  Diagnosis Date  . Diverticulosis 02/19/2018   By colonoscopy 2016  . GERD (gastroesophageal reflux disease)   . History of endometrial cancer 02/19/2018  . Hx of concussion   . IBS (irritable bowel syndrome)   . Kidney disease   . Thyroid disease    Past Surgical History:  Past Surgical History:  Procedure Laterality Date  . ENDOMETRIAL ABLATION    . SHOULDER SURGERY Left   . TUBAL LIGATION        Social History:  reports that she has never smoked. She has never used smokeless tobacco.  She reports current alcohol use. She reports that she does not use drugs.  Family History: family history includes Aortic dissection in her maternal grandmother; Esophageal cancer in her brother; Healthy in her daughter and son; Heart disease in her maternal grandfather; Hypotension in her maternal grandmother; Ovarian cancer in her paternal great-grandmother; Testicular cancer in her brother.   HOME MEDICATIONS: Allergies as of 11/09/2019      Reactions   Flagyl [metronidazole] Anaphylaxis   Iodine Anaphylaxis   Tramadol Hcl Other (See Comments)   Showed signs of stroke      Medication List       Accurate as of Nov 09, 2019  3:32 PM. If you have any questions, ask your nurse or doctor.        docusate sodium 100 MG capsule Commonly known as: COLACE Take 100 mg by mouth 2 (two) times daily.   EPINEPHrine 0.3 mg/0.3 mL Soaj injection Commonly known as: EPI-PEN Inject 0.3 mg into the muscle as needed for anaphylaxis.   famotidine 20 MG tablet Commonly known as: PEPCID Take 20 mg by mouth 2 (two) times daily.   fluticasone 50 MCG/ACT nasal spray Commonly known as: FLONASE Place 2 sprays into both nostrils daily.   loratadine 10 MG tablet Commonly known as: CLARITIN Take 10 mg by mouth daily.   MULTIVITAMIN ADULTS 50+ PO Take 1 tablet by mouth daily.  NP Thyroid 60 MG tablet Generic drug: thyroid Take 60 mg by mouth daily.   polyethylene glycol 17 g packet Commonly known as: MiraLax Take 17 g by mouth every other day.   progesterone 100 MG capsule Commonly known as: PROMETRIUM Take 100 mg by mouth at bedtime.   sucralfate 1 g tablet Commonly known as: Carafate Take 1 tablet (1 g total) by mouth every 6 (six) hours as needed. Slowy dissolve 1 tablet in 1 Tablespoon of distilled water to make a slurry before ingesting   Trulicity 1.5 SE/8.3TD Sopn Generic drug: Dulaglutide Inject 1.5 mg into the skin once a week.   vitamin B-12 1000 MCG tablet Commonly known  as: CYANOCOBALAMIN Take 1,000 mcg by mouth daily.   vitamin k 100 MCG tablet Take 100 mcg by mouth daily.         REVIEW OF SYSTEMS: A comprehensive ROS was conducted with the patient and is negative except as per HPI     OBJECTIVE:  VS: BP 128/88 (BP Location: Left Arm, Patient Position: Sitting, Cuff Size: Large)   Pulse 79   Temp 97.8 F (36.6 C)   Ht 5' 10"  (1.778 m)   Wt 158 lb (71.7 kg)   SpO2 98%   BMI 22.67 kg/m    Wt Readings from Last 3 Encounters:  11/09/19 158 lb (71.7 kg)  11/02/19 158 lb 6 oz (71.8 kg)  08/23/19 166 lb (75.3 kg)     EXAM: General: Pt appears well and is in NAD  Neck: General: Supple without adenopathy. Thyroid: Thyroid size normal.  No goiter or nodules appreciated. No thyroid bruit.  Lungs: Clear with good BS bilat with no rales, rhonchi, or wheezes  Heart: Auscultation: RRR.  Abdomen: Normoactive bowel sounds, soft, nontender, without masses or organomegaly palpable  Extremities:  BL LE: No pretibial edema normal ROM and strength.  Skin: Hair: Texture and amount normal with gender appropriate distribution Skin Inspection: No rashes Skin Palpation: Skin temperature, texture, and thickness normal to palpation  Neuro: Cranial nerves: II - XII grossly intact Motor: Normal strength throughout DTRs: 2+ and symmetric in UE without delay in relaxation phase  Mental Status: Judgment, insight: Intact Orientation: Oriented to time, place, and person Mood and affect: No depression, anxiety, or agitation     DATA REVIEWED:   Results for Candace, Allen (MRN [id number]) as of 11/09/2019 15:33  Ref. Range 11/09/2019 10:31  TSH Latest Ref Range: 0.35 - 4.50 uIU/mL 0.81  T4,Free(Direct) Latest Ref Range: 0.60 - 1.60 ng/dL 0.64    ASSESSMENT/PLAN/RECOMMENDATIONS:   1. Hypothyroidism:  - Clinically she is euthyroid  - No local neck symptoms  -  I have advised the patient for my preference of Levothyroxine rather then armour thyroid, due to  more stability with T4 content in levothyroxine and also armour thyroid has non-physiologic levels of T4:T3 of  4:1 (physiologic levels 13:1 to 16:1) - But she is tolerating this well and will continue at the current dose   Medications : Armour thyroid 60 mg daily     2.[place] Symptoms :  - I have discussed with the pt that the endocrine society has different recommendations/medicatoion and guidelines than what's practiced by integrative or holistic providers - In review of her PMH there's a diagnosis of endometrial cancer, but is not aware of this but I am not comfortable prescribing HRT without further information at this time.  - She has been asked to obtain the pathology report from endometrial ablation - We  discussed HRT risk of Venothrombotic events and breast ca   - I have asked her to stop the progesterone    3. Weight loss:  - Pt advised to stop Trulicity as she is not obese,nor does she has diabetes or prediabetes, she is having GI issues and I cautioned her the trulicity is associated with GI side effects     F/U in 3 months    Signed electronically by: Mack Guise, MD  [facility]  [facility] [address] [place], [place] [address] Phone: [phone] FAX: [phone]   CC: Candace Allen [address] [place] [address] Phone: [phone] Fax: [phone]   Return to Endocrinology clinic as below: Future Appointments  Date Time Provider [place]  11/10/2019  2:00 PM Armbruster, Carlota Raspberry, MD [facility]  02/14/2020  9:30 AM Dayra Rapley, Melanie Crazier, MD [facility] None

## 2019-11-10 ENCOUNTER — Ambulatory Visit (AMBULATORY_SURGERY_CENTER): Payer: 59 | Admitting: Gastroenterology

## 2019-11-10 ENCOUNTER — Encounter: Payer: Self-pay | Admitting: Gastroenterology

## 2019-11-10 VITALS — BP 121/74 | HR 68 | Temp 97.0°F | Resp 11 | Ht 70.0 in | Wt 158.0 lb

## 2019-11-10 DIAGNOSIS — R6881 Early satiety: Secondary | ICD-10-CM

## 2019-11-10 DIAGNOSIS — K3189 Other diseases of stomach and duodenum: Secondary | ICD-10-CM | POA: Diagnosis not present

## 2019-11-10 DIAGNOSIS — K219 Gastro-esophageal reflux disease without esophagitis: Secondary | ICD-10-CM | POA: Diagnosis present

## 2019-11-10 HISTORY — PX: UPPER GASTROINTESTINAL ENDOSCOPY: SHX188

## 2019-11-10 MED ORDER — SODIUM CHLORIDE 0.9 % IV SOLN: 500.0000 mL | Freq: Once | INTRAVENOUS | Status: DC

## 2019-11-10 MED ORDER — DEXTROSE 5 % IV SOLN: INTRAVENOUS | Status: DC

## 2019-11-10 NOTE — Patient Instructions (Signed)
YOU HAD AN ENDOSCOPIC PROCEDURE TODAY AT [facility]:   Refer to the procedure report that was given to you for any specific questions about what was found during the examination.  If the procedure report does not answer your questions, please call your gastroenterologist to clarify.  If you requested that your care partner not be given the details of your procedure findings, then the procedure report has been included in a sealed envelope for you to review at your convenience later.  YOU SHOULD EXPECT: Some feelings of bloating in the abdomen. Passage of more gas than usual.  Walking can help get rid of the air that was put into your GI tract during the procedure and reduce the bloating. If you had a lower endoscopy (such as a colonoscopy or flexible sigmoidoscopy) you may notice spotting of blood in your stool or on the toilet paper. If you underwent a bowel prep for your procedure, you may not have a normal bowel movement for a few days.  Please Note:  You might notice some irritation and congestion in your nose or some drainage.  This is from the oxygen used during your procedure.  There is no need for concern and it should clear up in a day or so.  SYMPTOMS TO REPORT IMMEDIATELY:    Following upper endoscopy (EGD)  Vomiting of blood or coffee ground material  New chest pain or pain under the shoulder blades  Painful or persistently difficult swallowing  New shortness of breath  Fever of 100F or higher  Black, tarry-looking stools  For urgent or emergent issues, a gastroenterologist can be reached at any hour by calling [phone]. Do not use MyChart messaging for urgent concerns.    DIET:  We do recommend a small meal at first, but then you may proceed to your regular diet.  Drink plenty of fluids but you should avoid alcoholic beverages for 24 hours.  MEDICATIONS: Continue present medications.  Please see handouts given to you by your recovery nurse.  Dr.  Havery Moros recommends you have a gastric emptying study to rule out gastroparesis.  ACTIVITY:  You should plan to take it easy for the rest of today and you should NOT DRIVE or use heavy machinery until tomorrow (because of the sedation medicines used during the test).    FOLLOW UP: Our staff will call the number listed on your records 48-72 hours following your procedure to check on you and address any questions or concerns that you may have regarding the information given to you following your procedure. If we do not reach you, we will leave a message.  We will attempt to reach you two times.  During this call, we will ask if you have developed any symptoms of COVID 19. If you develop any symptoms (ie: fever, flu-like symptoms, shortness of breath, cough etc.) before then, please call [phone].  If you test positive for Covid 19 in the 2 weeks post procedure, please call and report this information to [place].    If any biopsies were taken you will be contacted by phone or by letter within the next 1-3 weeks.  Please call us at [phone] if you have not heard about the biopsies in 3 weeks.   Thank you for allowing [place] to provide for your healthcare needs today.   SIGNATURES/CONFIDENTIALITY: You and/or your care partner have signed paperwork which will be entered into your electronic medical record.  These signatures attest to the fact that that  the information above on your After Visit Summary has been reviewed and is understood.  Full responsibility of the confidentiality of this discharge information lies with you and/or your care-partner.

## 2019-11-10 NOTE — Progress Notes (Signed)
To PACU, VSS. Report to [email or web address] 

## 2019-11-10 NOTE — Progress Notes (Signed)
Pt's states no medical or surgical changes since previsit or office visit.  Vitals - CW  CBG of 75 upon arrival, some nausea, D5W IV drip started x 15 mins.  CBG 93, NS drip started

## 2019-11-10 NOTE — Op Note (Signed)
[place] Patient Name: Candace Allen Procedure Date: 11/10/2019 2:21 PM MRN: [id number] Endoscopist: Remo Lipps P. Havery Moros , MD Age: 54 Referring MD:  Date of Birth: [birth year] Gender: Female Account #: [id number] Procedure:                Upper GI endoscopy Indications:              Follow-up of gastro-esophageal reflux disease,                            Early satiety - history of diabetes, CKD attributed                            possibly to PPI, using Pepcid monotherapy twice                            daily and carafate Medicines:                Monitored Anesthesia Care Procedure:                Pre-Anesthesia Assessment:                           - Prior to the procedure, a History and Physical                            was performed, and patient medications and                            allergies were reviewed. The patient's tolerance of                            previous anesthesia was also reviewed. The risks                            and benefits of the procedure and the sedation                            options and risks were discussed with the patient.                            All questions were answered, and informed consent                            was obtained. Prior Anticoagulants: The patient has                            taken no previous anticoagulant or antiplatelet                            agents. ASA Grade Assessment: II - A patient with                            mild systemic disease. After reviewing the risks  and benefits, the patient was deemed in                            satisfactory condition to undergo the procedure.                           After obtaining informed consent, the endoscope was                            passed under direct vision. Throughout the                            procedure, the patient's blood pressure, pulse, and                            oxygen saturations were monitored  continuously. The                            Endoscope was introduced through the mouth, and                            advanced to the second part of duodenum. The upper                            GI endoscopy was accomplished without difficulty.                            The patient tolerated the procedure well. Scope In: Scope Out: Findings:                 Esophagogastric landmarks were identified: the                            Z-line was found at 38 cm, the gastroesophageal                            junction was found at 38 cm and the upper extent of                            the gastric folds was found at 40 cm from the                            incisors.                           A 2 cm hiatal hernia was present.                           The exam of the esophagus was otherwise normal. No                            erosive esophagitis or Barrett's                           Excessive  fluid was found in the gastric body and                            suctioned.                           The exam of the stomach was otherwise normal. No                            outlet obstruction. Hill grade IV retroflexed views                            of the cardia.                           Biopsies were taken with a cold forceps in the                            gastric body, at the incisura and in the gastric                            antrum for Helicobacter pylori testing.                           Nodular mucosa was found in the duodenal bulb                            consistent with benign ectopic gastric mucosa.                           The exam of the duodenum was otherwise normal. Complications:            No immediate complications. Estimated blood loss:                            Minimal. Estimated Blood Loss:     Estimated blood loss was minimal. Impression:               - Esophagogastric landmarks identified.                           - 2 cm hiatal hernia.                            - Normal esophagus otherwise - no esophagitis or                            Barrett's                           - Excessive gastric fluid raising possibility of                            gastroparesis.                           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                           Overall no erosive changes or damage to the                            esophagus from reflux, can continue present regimen                            for now. If you wish to consider non medical                            therapy for reflux, given Hill grade IV views of                            the cardia I don't think patient is candidate for                            TIF, would consider surgical evaluation although                            would recommend gastric emptying study to rule out                            gastroparesis. Recommendation:           - Patient has a contact number available for                            emergencies. The signs and symptoms of potential                            delayed complications were discussed with the                            patient. Return to normal activities tomorrow.                            Written discharge instructions were provided to the                            patient.                           - Resume previous diet.                           -  Continue present medications.                           - Await pathology results.                           - Recommend gastric emptying study to rule out                            gastroparesis. Remo Lipps P. Danely Bayliss, MD 11/10/2019 2:46:30 PM This report has been signed electronically.

## 2019-11-12 ENCOUNTER — Telehealth: Payer: Self-pay

## 2019-11-12 NOTE — Telephone Encounter (Signed)
  Follow up Call-  Call back number 11/10/2019  Post procedure Call Back phone  # [phone]  Permission to leave phone message Yes  Some recent data might be hidden     Patient questions:  Do you have a fever, pain , or abdominal swelling? No. Pain Score  0 *  Have you tolerated food without any problems? Yes.    Have you been able to return to your normal activities? Yes.    Do you have any questions about your discharge instructions: Diet   No. Medications  No. Follow up visit  No.  Do you have questions or concerns about your Care? No.  Actions: * If pain score is 4 or above: No action needed, pain <4.    1. Have you developed a fever since your procedure? No  2.   Have you had an respiratory symptoms (SOB or cough) since your procedure? No  3.   Have you tested positive for COVID 19 since your procedure NO  4.   Have you had any family members/close contacts diagnosed with the COVID 19 since your procedure?  No   If yes to any of these questions please route to Joylene John, RN and Erenest Rasher, RN

## 2019-11-16 ENCOUNTER — Telehealth: Payer: Self-pay | Admitting: Physician Assistant

## 2019-11-16 ENCOUNTER — Telehealth: Payer: Self-pay | Admitting: Gastroenterology

## 2019-11-16 DIAGNOSIS — R6881 Early satiety: Secondary | ICD-10-CM

## 2019-11-16 DIAGNOSIS — R630 Anorexia: Secondary | ICD-10-CM

## 2019-11-16 NOTE — Telephone Encounter (Signed)
Please advise ? Have you received any notes on this?

## 2019-11-16 NOTE — Telephone Encounter (Signed)
In reviewing patient notes. On her day of Endoscopy procedure her CBG was 75 upon arrival, given D5W drip for 15 mins and recheck CBG was 93, then given NS. No additional labs were drawn.

## 2019-11-16 NOTE — Telephone Encounter (Signed)
I have not received anything. Last glucose in system I see is from March and normal.

## 2019-11-16 NOTE — Telephone Encounter (Signed)
Patient calling to follow up states Dr. Havery Moros told her he would like to put her on a study for paracentesis. Please advise

## 2019-11-16 NOTE — Telephone Encounter (Signed)
Patient is due for a gastric emptying study.  She is scheduled at [facility] for 11/24/19 7:00 arrival for 7:30 exam. She is asked to be NPO after midnight and hold all of her GI meds that am.

## 2019-11-16 NOTE — Telephone Encounter (Signed)
Patient called stating that she had her endoscopy procedure - Is concerned that her numbers were to low when she had procedure - they had to give her dextrose

## 2019-11-17 NOTE — Telephone Encounter (Signed)
Not a low glucose level but assuming they wanted to be a bit higher to be cautious for her procedure. Can always have her come in to lab to recheck fasting glucose to further assess things. Can consider home glucometer but do not feel necessary. Make sure she is keeping a well-balanced diet and not skipping meals.

## 2019-11-17 NOTE — Telephone Encounter (Signed)
Spoke with patient about her glucose level prior to Endoscopy. She was concerned but advised that her glucose numbers were normal. She is still taking the Trulicity which was prescribed by the Integrative doctor she was seeing. Her last Trulicity injection is on Thursday nite.  She does admit to not eating a lot. With her hiatal hernia she wasn't eating a lot. She has gastric study coming up.

## 2019-11-18 NOTE — Telephone Encounter (Signed)
Called patient to inform of PCP recommendations. She stated that she is stopping the Trulicity.

## 2019-11-18 NOTE — Telephone Encounter (Signed)
Would recommend stopping the Trulicity as she is not diabetic and is on it for weight only from her Integrative Med specialist.

## 2019-11-22 ENCOUNTER — Telehealth: Payer: Self-pay | Admitting: Gastroenterology

## 2019-11-22 NOTE — Telephone Encounter (Signed)
I answered the patient on the MyChart message that she sent

## 2019-11-22 NOTE — Telephone Encounter (Signed)
Pt is scheduled for GES on Wednesday, she would like to learn a bit more about the study, in particular if it involves iodine. Pt states that she has a very bad allergy to that. Pls call her.

## 2019-11-24 ENCOUNTER — Encounter (HOSPITAL_COMMUNITY)
Admission: RE | Admit: 2019-11-24 | Discharge: 2019-11-24 | Disposition: A | Discharge status: Home / Self Care | Payer: 59 | Source: Ambulatory Visit | Attending: Gastroenterology | Admitting: Gastroenterology

## 2019-11-24 ENCOUNTER — Other Ambulatory Visit: Payer: Self-pay

## 2019-11-24 DIAGNOSIS — R630 Anorexia: Secondary | ICD-10-CM | POA: Diagnosis present

## 2019-11-24 DIAGNOSIS — R6881 Early satiety: Secondary | ICD-10-CM | POA: Diagnosis present

## 2019-11-24 MED ORDER — TECHNETIUM TC 99M SULFUR COLLOID
2.2000 | Freq: Once | INTRAVENOUS | Status: AC | PRN
  Administered 2019-11-24: 2.2 via INTRAVENOUS

## 2019-11-29 ENCOUNTER — Telehealth: Payer: Self-pay | Admitting: Gastroenterology

## 2019-11-29 NOTE — Telephone Encounter (Signed)
Pt would like to know what the next step on her treatment is. She stated that Dr. Havery Moros was thinking about referring her to a surgeon for his Hiatal hernia but wanted to wait for result of GES. She also stated that since her egd she has been experiencing strong back pain. Pls call her.

## 2019-11-29 NOTE — Telephone Encounter (Signed)
I reviewed with the patient the results and MyChart message that was sent to her by Dr. Havery Moros.  She is doing "okay".  She will think about what she wants to do regarding a surgical referral and call back if she wants to go that route

## 2020-02-14 ENCOUNTER — Ambulatory Visit: Payer: 59 | Admitting: Internal Medicine

## 2020-02-14 NOTE — Progress Notes (Deleted)
Name: Candace Allen  MRN/ DOB: [id number], 12-22-1965    Age/ Sex: 54 y.o., female     PCP: Delorse Limber   Reason for Endocrinology Evaluation: Hypothyroidism     Initial Endocrinology Clinic Visit: 11/09/2019    PATIENT IDENTIFIER: Ms. Candace Allen is a 54 y.o., female with a past medical history of IBS. She has followed with [facility] since 11/09/2019 for consultative assistance with management of her Hypothyroidism  HISTORICAL SUMMARY:   Pt has been diagnosed with hypothyroidism through integrative health specialist back in 11/2018 She presented there with hot flashes, weight gain , irritability and mood swings as well as fatigue and tiredness. She was started on armour thyroid and has been on this dose ever since. She was also started on Trulicity as well as a hormonal cocktail that includes testosterone.   Lab records not available   She was put on  hip pellets (?estrogen and testosterone ) In 06/2019 that would last for ~ 3 month  She is also on Progesterone 100 mg daily   She does have hx of endometrial ablation, pt tells me they found tumors?  No FH of breast cancer or ovarian cancer    Pap smear negative 08/23/2019 Mammo negative 09/24/2019  SUBJECTIVE:     Today (02/14/2020):  Candace Allen is here for ****   ROS:  As per HPI.   HISTORY:  Past Medical History:  Past Medical History:  Diagnosis Date  . Diverticulosis 02/19/2018   By colonoscopy 2016  . GERD (gastroesophageal reflux disease)   . History of endometrial cancer 02/19/2018  . Hx of concussion   . IBS (irritable bowel syndrome)   . Kidney disease   . Prediabetes   . Thyroid disease     Past Surgical History:  Past Surgical History:  Procedure Laterality Date  . COLONOSCOPY  2016  . ENDOMETRIAL ABLATION    . SHOULDER SURGERY Left   . TUBAL LIGATION    . UPPER GASTROINTESTINAL ENDOSCOPY  11/10/2019     Social History:  reports that she has never smoked. She has  never used smokeless tobacco. She reports current alcohol use. She reports that she does not use drugs. Family History:  Family History  Problem Relation Age of Onset  . Testicular cancer Brother   . Esophageal cancer Brother   . Healthy Daughter   . Healthy Son   . Hypotension Maternal Grandmother   . Aortic dissection Maternal Grandmother   . Heart disease Maternal Grandfather   . Ovarian cancer Paternal Great-grandmother       HOME MEDICATIONS: Allergies as of 02/14/2020      Reactions   Flagyl [metronidazole] Anaphylaxis   Iodine Anaphylaxis   Tramadol Hcl Other (See Comments)   Showed signs of stroke      Medication List       Accurate as of February 14, 2020  7:51 AM. If you have any questions, ask your nurse or doctor.        docusate sodium 100 MG capsule Commonly known as: COLACE Take 100 mg by mouth 2 (two) times daily.   EPINEPHrine 0.3 mg/0.3 mL Soaj injection Commonly known as: EPI-PEN Inject 0.3 mg into the muscle as needed for anaphylaxis.   famotidine 20 MG tablet Commonly known as: PEPCID Take 20 mg by mouth 2 (two) times daily.   loratadine 10 MG tablet Commonly known as: CLARITIN Take 10 mg by mouth daily.   MULTIVITAMIN ADULTS 50+ PO  Take 1 tablet by mouth daily.   NP Thyroid 60 MG tablet Generic drug: thyroid Take 1 tablet (60 mg total) by mouth daily.   polyethylene glycol 17 g packet Commonly known as: MiraLax Take 17 g by mouth every other day.   progesterone 100 MG capsule Commonly known as: PROMETRIUM Take 100 mg by mouth at bedtime.   sucralfate 1 g tablet Commonly known as: Carafate Take 1 tablet (1 g total) by mouth every 6 (six) hours as needed. Slowy dissolve 1 tablet in 1 Tablespoon of distilled water to make a slurry before ingesting   TRULICITY [place] Inject into the skin once a week. On Thursdays   vitamin B-12 1000 MCG tablet Commonly known as: CYANOCOBALAMIN Take 1,000 mcg by mouth daily.   vitamin k 100 MCG  tablet Take 100 mcg by mouth daily.         OBJECTIVE:   PHYSICAL EXAM: VS: There were no vitals taken for this visit.   EXAM: General: Pt appears well and is in NAD  Hydration: Well-hydrated with moist mucous membranes and good skin turgor  Eyes: External eye exam normal without stare, lid lag or exophthalmos.  EOM intact.  PERRL.  Ears, Nose, Throat: Hearing: Grossly intact bilaterally Dental: Good dentition  Throat: Clear without mass, erythema or exudate  Neck: General: Supple without adenopathy. Thyroid: Thyroid size normal.  No goiter or nodules appreciated. No thyroid bruit.  Lungs: Clear with good BS bilat with no rales, rhonchi, or wheezes  Heart: Auscultation: RRR.  Abdomen: Normoactive bowel sounds, soft, nontender, without masses or organomegaly palpable  Extremities: Gait and station: Normal gait  Digits and nails: No clubbing, cyanosis, petechiae, or nodes Head and neck: Normal alignment and mobility BL UE: Normal ROM and strength. BL LE: No pretibial edema normal ROM and strength.  Skin: Hair: Texture and amount normal with gender appropriate distribution Skin Inspection: No rashes, acanthosis nigricans/skin tags. No lipohypertrophy Skin Palpation: Skin temperature, texture, and thickness normal to palpation  Neuro: Cranial nerves: II - XII grossly intact  Cerebellar: Normal coordination and movement; no tremor Motor: Normal strength throughout DTRs: 2+ and symmetric in UE without delay in relaxation phase  Mental Status: Judgment, insight: Intact Orientation: Oriented to time, place, and person Memory: Intact for recent and remote events Mood and affect: No depression, anxiety, or agitation     DATA REVIEWED: ***    ASSESSMENT / PLAN / RECOMMENDATIONS:   1. Hypothyroidism:  - Clinically she is euthyroid  - No local neck symptoms  -  I have advised the patient for my preference of Levothyroxine rather then armour thyroid, due to more stability  with T4 content in levothyroxine and also armour thyroid has non-physiologic levels of T4:T3 of  4:1 (physiologic levels 13:1 to 16:1) - But she is tolerating this well and will continue at the current dose   Medications : Armour thyroid 60 mg daily     2.[place] Symptoms :  - I have discussed with the pt that the endocrine society has different recommendations/medicatoion and guidelines than what's practiced by integrative or holistic providers - In review of her PMH there's a diagnosis of endometrial cancer, but is not aware of this but I am not comfortable prescribing HRT without further information at this time.  - She has been asked to obtain the pathology report from endometrial ablation - We discussed HRT risk of Venothrombotic events and breast ca   - I have asked her to stop the progesterone  3. Weight loss:  - Pt advised to stop Trulicity as she is not obese,nor does she has diabetes or prediabetes, she is having GI issues and I cautioned her the trulicity is associated with GI side effects     Signed electronically by: Mack Guise, MD  [facility]  [facility] [address] [place], [place] [address] Phone: [phone] FAX: [phone]      CC: Delorse Limber [address] [place] [address] Phone: [phone]  Fax: [phone]   Return to Endocrinology clinic as below: Future Appointments  Date Time Provider [place]  02/14/2020  9:30 AM Katheryn Culliton, Melanie Crazier, MD [facility] None

## 2020-02-17 ENCOUNTER — Telehealth: Payer: Self-pay | Admitting: Gastroenterology

## 2020-02-17 NOTE — Telephone Encounter (Signed)
Sorry, colon only. Thanks

## 2020-02-17 NOTE — Telephone Encounter (Signed)
Records of prior procedures received:  Colonoscopy 05/05/2014- moderate diverticulosis sigmoid colon, no polyps  EGD 05/05/2014 - LA Grade A esophagitis, mild gastritis / duodenitis    Jan can you please place a recall for 04/2024 for this patient for screening. Thanks

## 2020-02-17 NOTE — Telephone Encounter (Signed)
Recall colon placed for 04-2024

## 2020-03-16 ENCOUNTER — Encounter: Payer: Self-pay | Admitting: Internal Medicine

## 2020-03-16 ENCOUNTER — Ambulatory Visit (INDEPENDENT_AMBULATORY_CARE_PROVIDER_SITE_OTHER): Payer: 59 | Admitting: Internal Medicine

## 2020-03-16 ENCOUNTER — Other Ambulatory Visit: Payer: Self-pay

## 2020-03-16 VITALS — BP 120/80 | HR 70 | Ht 70.0 in | Wt 155.0 lb

## 2020-03-16 DIAGNOSIS — E039 Hypothyroidism, unspecified: Secondary | ICD-10-CM | POA: Diagnosis not present

## 2020-03-16 LAB — TSH: TSH: 1.06 u[IU]/mL (ref 0.35–4.50)

## 2020-03-16 NOTE — Progress Notes (Signed)
Name: Candace Allen  MRN/ DOB: [id number], 02/03/1966    Age/ Sex: 54 y.o., female     PCP: Candace Allen   Reason for Endocrinology Evaluation: Hypothyroidism     Initial Endocrinology Clinic Visit: 11/09/2019    PATIENT IDENTIFIER: Candace Allen is a 54 y.o., female with a past medical history of IBS. She has followed with [place] [facility] [facility] since 11/09/2019 for consultative assistance with management of her hypothyroidism.   HISTORICAL SUMMARY:   Pt has been diagnosed with hypothyroidism through integrative health specialist back in 11/2018 She presented there with hot flashes, weight gain , irritability and mood swings as well as fatigue and tiredness. She was started on armour thyroid and has been on this dose ever since. She was also started on Trulicity as well as a hormonal cocktail that includes testosterone.   She was put on  hip pellets (?estrogen and testosterone ) In 06/2019 that would last for ~ 3 month  She is also on Progesterone 100 mg daily   She does have hx of endometrial ablation, pt tells me they found tumors?  No FH of breast cancer or ovarian cancer    Pap smear negative 08/23/2019 Mammo negative 09/24/2019   On her initial visit to our clinic , we continued armour thyroid   As for her HRT , this was not refilled, pt has a diagnosis of endometrial cancer in her chart and was advised to obtain records. She was also advised to stop progesterone. As she had already stopped hormonal pellets Pt was also advised to stop trulicity as she had no diagnosis of DM nor obesity ( BMI was 22.67 .  SUBJECTIVE:    Today (03/16/2020):  Candace Allen is here for a follow up on hypothyroidism.   She has noted weight loss  Has been feeling well without stress  Denies palpitations  Has occasional Diarrhea due to IBS  No local neck symptoms   HISTORY:  Past Medical History:  Past Medical History:  Diagnosis Date  . Diverticulosis 02/19/2018   By  colonoscopy 2016  . GERD (gastroesophageal reflux disease)   . History of endometrial cancer 02/19/2018  . Hx of concussion   . IBS (irritable bowel syndrome)   . Kidney disease   . Prediabetes   . Thyroid disease    Past Surgical History:  Past Surgical History:  Procedure Laterality Date  . COLONOSCOPY  2016  . ENDOMETRIAL ABLATION    . SHOULDER SURGERY Left   . TUBAL LIGATION    . UPPER GASTROINTESTINAL ENDOSCOPY  11/10/2019    Social History:  reports that she has never smoked. She has never used smokeless tobacco. She reports current alcohol use. She reports that she does not use drugs. Family History:  Family History  Problem Relation Age of Onset  . Testicular cancer Brother   . Esophageal cancer Brother   . Healthy Daughter   . Healthy Son   . Hypotension Maternal Grandmother   . Aortic dissection Maternal Grandmother   . Heart disease Maternal Grandfather   . Ovarian cancer Paternal Great-grandmother      HOME MEDICATIONS: Allergies as of 03/16/2020      Reactions   Flagyl [metronidazole] Anaphylaxis   Iodine Anaphylaxis   Tramadol Hcl Other (See Comments)   Showed signs of stroke      Medication List       Accurate as of March 16, 2020  1:16 PM. If you have any questions,  ask your nurse or doctor.        STOP taking these medications   progesterone 100 MG capsule Commonly known as: PROMETRIUM Stopped by: Candace Sciara, MD   TRULICITY [place] Stopped by: Candace Sciara, MD     TAKE these medications   docusate sodium 100 MG capsule Commonly known as: COLACE Take 100 mg by mouth 2 (two) times daily.   EPINEPHrine 0.3 mg/0.3 mL Soaj injection Commonly known as: EPI-PEN Inject 0.3 mg into the muscle as needed for anaphylaxis.   famotidine 20 MG tablet Commonly known as: PEPCID Take 20 mg by mouth 2 (two) times daily.   loratadine 10 MG tablet Commonly known as: CLARITIN Take 10 mg by mouth daily.   MULTIVITAMIN ADULTS 50+  PO Take 1 tablet by mouth daily.   NP Thyroid 60 MG tablet Generic drug: thyroid Take 1 tablet (60 mg total) by mouth daily.   polyethylene glycol 17 g packet Commonly known as: MiraLax Take 17 g by mouth every other day.   sucralfate 1 g tablet Commonly known as: Carafate Take 1 tablet (1 g total) by mouth every 6 (six) hours as needed. Slowy dissolve 1 tablet in 1 Tablespoon of distilled water to make a slurry before ingesting   vitamin B-12 1000 MCG tablet Commonly known as: CYANOCOBALAMIN Take 1,000 mcg by mouth daily.   vitamin k 100 MCG tablet Take 100 mcg by mouth daily.         OBJECTIVE:   PHYSICAL EXAM: VS: BP 120/80   Pulse 70   Ht 5' 10"  (1.778 m)   Wt 155 lb (70.3 kg)   SpO2 99%   BMI 22.24 kg/m    EXAM: General: Pt appears well and is in NAD  Neck: General: Supple without adenopathy. Thyroid: Thyroid size normal.  No goiter or nodules appreciated. No thyroid bruit.  Lungs: Clear with good BS bilat with no rales, rhonchi, or wheezes  Heart: Auscultation: RRR.  Abdomen: Normoactive bowel sounds, soft, nontender, without masses or organomegaly palpable  Extremities:  BL LE: No pretibial edema normal ROM and strength.  Mental Status: Judgment, insight: Intact Orientation: Oriented to time, place, and person Memory: Intact for recent and remote events Mood and affect: No depression, anxiety, or agitation     DATA REVIEWED:  Results for Candace Allen (MRN [id number]) as of 03/16/2020 13:16  Ref. Range 03/16/2020 09:24  TSH Latest Ref Range: 0.35 - 4.50 uIU/mL 1.06     ASSESSMENT / PLAN / RECOMMENDATIONS:   1. Hypothyroidism :  - Pt has been feeling moody and irritable , no other hypo or hyperthyroid symptom s - she is compliant with NP thyroid   Medications   Continue NP thyroid 60 mg daily     F/U in 1 yr    Signed electronically by: Candace Guise, MD  [facility]  [facility] [address] [place], [place] [address] Phone: [phone] FAX: [phone]      CC: Candace Allen [address] [place] [address] Phone: [phone]  Fax: [phone]   Return to Endocrinology clinic as below: Future Appointments  Date Time Provider [place]  03/16/2021  8:30 AM Candace Allen, Candace Crazier, MD [facility] None

## 2020-03-16 NOTE — Patient Instructions (Signed)
-   Continue NP thyroid 60 mg daily for now

## 2020-03-23 ENCOUNTER — Encounter: Payer: Self-pay | Admitting: Physician Assistant

## 2020-03-23 DIAGNOSIS — Z79890 Hormone replacement therapy (postmenopausal): Secondary | ICD-10-CM

## 2020-03-24 NOTE — Telephone Encounter (Signed)
Ok to place referral to GYN for postmenopausal HRT.

## 2020-04-03 ENCOUNTER — Encounter: Payer: Self-pay | Admitting: Obstetrics and Gynecology

## 2020-04-03 ENCOUNTER — Ambulatory Visit: Payer: 59 | Admitting: Obstetrics and Gynecology

## 2020-04-03 ENCOUNTER — Other Ambulatory Visit: Payer: Self-pay

## 2020-04-03 VITALS — BP 118/76 | HR 66 | Ht 70.5 in | Wt 151.6 lb

## 2020-04-03 DIAGNOSIS — N951 Menopausal and female climacteric states: Secondary | ICD-10-CM

## 2020-04-03 MED ORDER — PROGESTERONE MICRONIZED 100 MG PO CAPS: 100.0000 mg | ORAL_CAPSULE | Freq: Every day | ORAL | 0 refills | Status: DC

## 2020-04-03 MED ORDER — ESTRADIOL 0.5 MG PO TABS: 0.5000 mg | ORAL_TABLET | Freq: Every day | ORAL | 0 refills | Status: DC

## 2020-04-03 NOTE — Progress Notes (Signed)
GYNECOLOGY  VISIT  Pap had AEX with PCP 08-20-19   HPI: 54 y.o.   Married  Caucasian  female   G2P2 with No LMP recorded. Patient has had an ablation.   here for menopausal symptoms onset at the beginning of the pandemic, 2020.  Patient is having insomnia, hot flashes, night sweats and moodiness. She saw holistic therapist for 8 months which helped some but now seeking further treatment.(this was when she had no insurance).  Has mood swings, quick to react, feels on edge, and can cry easily. States she is usually laid back.   Not sleeping; having night sweats. Not having as many symptoms during the daytime.  She does not want feel sedated, weight gain, or acne with her treatment.   Patient has used estrogen and progesterone creams, which patient did not like due to stickiness.  Tried testosterone/estrogen pellets and took Prometrium, and her symptoms were controlled. No hot flashes then.   Has seen an endocrinologist about her thyroid.  Hx endometrial ablation with last vaginal bleeding 10 -12 years ago.  Hx fibroids.  Denies history of endometrial cancer.  States history of endometriosis.   Gluten intolerance.   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  Benign biopsy.   Patient has moved here from [facility] area and trying to obtain records.  Daughter is at [employer].  GYNECOLOGIC HISTORY: No LMP recorded. Patient has had an ablation. Contraception:  tubal Menopausal hormone therapy:  none Last mammogram: 09-24-19 Neg/density C/Birads1  Last pap smear: 08-23-19 Neg:Neg HR HPV--no hx of abnormal paps        OB History    Gravida  2   Para  2   Term      Preterm      AB      Living  2     SAB      TAB      Ectopic      Multiple      Live Births                 Patient Active Problem List   Diagnosis Date Noted   Postmenopausal 11/09/2019   Acquired hypothyroidism 11/09/2019   Weight loss 11/09/2019   Hypothyroidism  (acquired) 08/20/2019   On hormone replacement therapy 08/20/2019   Visit for preventive health examination 08/20/2019   Encounter for screening mammogram for malignant neoplasm of breast 08/20/2019   History of endometrial cancer 02/19/2018   GERD (gastroesophageal reflux disease) 02/19/2018   IBS (irritable bowel syndrome) 02/19/2018   Diverticulosis 02/19/2018    Past Medical History:  Diagnosis Date   Diverticulosis 02/19/2018   By colonoscopy 2016   Fibroid    GERD (gastroesophageal reflux disease)    History of endometrial cancer 02/19/2018   Hx of concussion    IBS (irritable bowel syndrome)    Kidney disease    Stage III--due to medication dexilant   Prediabetes    Thyroid disease     Past Surgical History:  Procedure Laterality Date   COLONOSCOPY  2016   ENDOMETRIAL ABLATION     SHOULDER SURGERY Left    TUBAL LIGATION     UPPER GASTROINTESTINAL ENDOSCOPY  11/10/2019    Current Outpatient Medications  Medication Sig Dispense Refill   docusate sodium (COLACE) 100 MG capsule Take 100 mg by mouth 2 (two) times daily.     EPINEPHrine 0.3 mg/0.3 mL IJ SOAJ injection Inject 0.3 mg into the muscle  as needed for anaphylaxis.     famotidine (PEPCID) 20 MG tablet Take 20 mg by mouth 2 (two) times daily.     loratadine (CLARITIN) 10 MG tablet Take 10 mg by mouth daily.     Multiple Vitamins-Minerals (MULTIVITAMIN ADULTS 50+ PO) Take 1 tablet by mouth daily.     NP THYROID 60 MG tablet Take 1 tablet (60 mg total) by mouth daily. 90 tablet 3   OVER THE COUNTER MEDICATION Charcoal tablets; takes 1-2 tablets prn     polyethylene glycol (MIRALAX) 17 g packet Take 17 g by mouth every other day. 14 each 0   sucralfate (CARAFATE) 1 g tablet Take 1 tablet (1 g total) by mouth every 6 (six) hours as needed. Slowy dissolve 1 tablet in 1 Tablespoon of distilled water to make a slurry before ingesting 90 tablet 2   vitamin B-12 (CYANOCOBALAMIN) 1000 MCG tablet  Take 1,000 mcg by mouth daily.     vitamin k 100 MCG tablet Take 100 mcg by mouth daily.     Current Facility-Administered Medications  Medication Dose Route Frequency Provider Last Rate Last Admin   0.9 %  sodium chloride infusion  500 mL Intravenous Once Armbruster, Carlota Raspberry, MD       dextrose 5 % solution   Intravenous Continuous Armbruster, Carlota Raspberry, MD         ALLERGIES: Flagyl [metronidazole], Iodine, and Tramadol hcl  Family History  Problem Relation Age of Onset   Testicular cancer Brother    Esophageal cancer Brother    Healthy Daughter    Healthy Son    Hypotension Maternal Grandmother    Aortic dissection Maternal Grandmother    Heart disease Maternal Grandfather    Diabetes Paternal Grandmother        AODM   Diabetes Paternal Grandfather        AODM   Ovarian cancer Paternal Great-grandmother    Social History   Socioeconomic History   Marital status: Married    Spouse name: Not on file   Number of children: 2   Years of education: Not on file   Highest education level: Not on file  Occupational History   Occupation: Neurosurgeon  Tobacco Use   Smoking status: Never Smoker   Smokeless tobacco: Never Used  Scientific laboratory technician Use: Never used  Substance and Sexual Activity   Alcohol use: Yes    Alcohol/week: 2.0 standard drinks    Types: 2 Standard drinks or equivalent per week    Comment: occasionally  per week   Drug use: Never   Sexual activity: Yes    Birth control/protection: Surgical    Comment: Ablation  Other Topics Concern   Not on file  Social History Narrative   Not on file   Social Determinants of Health   Financial Resource Strain:    Difficulty of Paying Living Expenses: Not on file  Food Insecurity:    Worried About Charity fundraiser in the Last Year: Not on file   [employer] of Food in the Last Year: Not on file  Transportation Needs:    Lack of Transportation (Medical): Not on file   Lack of  Transportation (Non-Medical): Not on file  Physical Activity:    Days of Exercise per Week: Not on file   Minutes of Exercise per Session: Not on file  Stress:    Feeling of Stress : Not on file  Social Connections:    Frequency of Communication with  Friends and Family: Not on file   Frequency of Social Gatherings with Friends and Family: Not on file   Attends Religious Services: Not on file   Active Member of Clubs or Organizations: Not on file   Attends Archivist Meetings: Not on file   Marital Status: Not on file  Intimate Partner Violence:    Fear of Current or Ex-Partner: Not on file   Emotionally Abused: Not on file   Physically Abused: Not on file   Sexually Abused: Not on file    Review of Systems  Constitutional:       Hot flashes, nigh sweats, moodiness   Psychiatric/Behavioral: Positive for sleep disturbance (insomnia).  All other systems reviewed and are negative.   PHYSICAL EXAMINATION:    BP 118/76    Pulse 66    Ht 5' 10.5" (1.791 m)    Wt 151 lb 9.6 oz (68.8 kg)    SpO2 99%    BMI 21.44 kg/m     General appearance: alert, cooperative and appears stated age Head: Normocephalic, without obvious abnormality, atraumatic Neck: no adenopathy, supple, symmetrical, trachea midline and thyroid normal to inspection and palpation Lungs: clear to auscultation bilaterally Heart: regular rate and rhythm Abdomen: soft, non-tender, no masses,  no organomegaly Extremities: extremities normal, atraumatic, no cyanosis or edema  ASSESSMENT  Menopausal symptoms.  Status post endometrial ablation 15 - 16 years ago.  Denies hx of endometrial cancer.  Chronic renal disease, stage III.  Low GFR.  Gluten intolerance.  PLAN  We discussed menopausal symptoms and treatment options - herbal, HRT, Gabapentin, and SSRI/SNRIs.  Risks and benefits reviewed. Discused WHI and use of HRT which can increase risk of PE, DVT, MI, stroke and breast cancer.  She will  take Prometrium 100 mg q hs and Estrace 0.5 mg daily.   FU in 3 months for a recheck, sooner if needed. She will seek a correction in her medical history to remove hx of endometrial cancer.   She will contact her GI about taking charcoal tablets.

## 2020-04-03 NOTE — Patient Instructions (Signed)
Menopause and Hormone Replacement Therapy Menopause is a normal time of life when menstrual periods stop completely and the ovaries stop producing the female hormones estrogen and progesterone. This lack of hormones can affect your health and cause undesirable symptoms. Hormone replacement therapy (HRT) can relieve some of those symptoms. What is hormone replacement therapy? HRT is the use of artificial (synthetic) hormones to replace hormones that your body has stopped producing because you have reached menopause. What are my options for HRT?  HRT may consist of the synthetic hormones estrogen and progestin, or it may consist of only estrogen (estrogen-only therapy). You and your health care provider will decide which form of HRT is best for you. If you choose to be on HRT and you have a uterus, estrogen and progestin are usually prescribed. Estrogen-only therapy is used for women who do not have a uterus. Possible options for taking HRT include:  Pills.  Patches.  Gels.  Sprays.  Vaginal cream.  Vaginal rings.  Vaginal inserts. The amount of hormone(s) that you take and how long you take the hormone(s) varies according to your health. It is important to:  Begin HRT with the lowest possible dosage.  Stop HRT as soon as your health care provider tells you to stop.  Work with your health care provider so that you feel informed and comfortable with your decisions. What are the benefits of HRT? HRT can reduce the frequency and severity of menopausal symptoms. Benefits of HRT vary according to the kind of symptoms that you have, how severe they are, and your overall health. HRT may help to improve the following symptoms of menopause:  Hot flashes and night sweats. These are sudden feelings of heat that spread over the face and body. The skin may turn red, like a blush. Night sweats are hot flashes that happen while you are sleeping or trying to sleep.  Bone loss (osteoporosis). The  body loses calcium more quickly after menopause, causing the bones to become weaker. This can increase the risk for bone breaks (fractures).  Vaginal dryness. The lining of the vagina can become thin and dry, which can cause pain during sex or cause infection, burning, or itching.  Urinary tract infections.  Urinary incontinence. This is the inability to control when you pass urine.  Irritability.  Short-term memory problems. What are the risks of HRT? Risks of HRT vary depending on your individual health and medical history. Risks of HRT also depend on whether you receive both estrogen and progestin or you receive estrogen only. HRT may increase the risk of:  Spotting. This is when a small amount of blood leaks from the vagina unexpectedly.  Endometrial cancer. This cancer is in the lining of the uterus (endometrium).  Breast cancer.  Increased density of breast tissue. This can make it harder to find breast cancer on a breast X-ray (mammogram).  Stroke.  Heart disease.  Blood clots.  Gallbladder disease.  Liver disease. Risks of HRT can increase if you have any of the following conditions:  Endometrial cancer.  Liver disease.  Heart disease.  Breast cancer.  History of blood clots.  History of stroke. Follow these instructions at home:  Take over-the-counter and prescription medicines only as told by your health care provider.  Get mammograms, pelvic exams, and medical checkups as often as told by your health care provider.  Have Pap tests done as often as told by your health care provider. A Pap test is sometimes called a Pap smear. It   is a screening test that is used to check for signs of cancer of the cervix and vagina. A Pap test can also identify the presence of infection or precancerous changes. Pap tests may be done: ? Every 3 years, starting at age 21. ? Every 5 years, starting after age 30, in combination with testing for human papillomavirus  (HPV). ? More often or less often depending on other medical conditions you have, your age, and other risk factors.  It is up to you to get the results of your Pap test. Ask your health care provider, or the department that is doing the test, when your results will be ready.  Keep all follow-up visits as told by your health care provider. This is important. Contact a health care provider if you have:  Pain or swelling in your legs.  Shortness of breath.  Chest pain.  Lumps or changes in your breasts or armpits.  Slurred speech.  Pain, burning, or bleeding when you urinate.  Unusual vaginal bleeding.  Dizziness or headaches.  Weakness or numbness in any part of your arms or legs.  Pain in your abdomen. Summary  Menopause is a normal time of life when menstrual periods stop completely and the ovaries stop producing the female hormones estrogen and progesterone.  Hormone replacement therapy (HRT) can relieve some of the symptoms of menopause.  HRT can reduce the frequency and severity of menopausal symptoms.  Risks of HRT vary depending on your individual health and medical history. This information is not intended to replace advice given to you by your health care provider. Make sure you discuss any questions you have with your health care provider. Document Revised: 02/03/2018 Document Reviewed: 02/03/2018 Elsevier Patient Education  2020 Elsevier Inc.  

## 2020-05-25 ENCOUNTER — Encounter: Payer: Self-pay | Admitting: Internal Medicine

## 2020-05-25 ENCOUNTER — Other Ambulatory Visit: Payer: Self-pay | Admitting: Internal Medicine

## 2020-05-25 MED ORDER — LEVOTHYROXINE SODIUM 75 MCG PO TABS: 75.0000 ug | ORAL_TABLET | Freq: Every day | ORAL | 3 refills | Status: DC

## 2020-06-17 DIAGNOSIS — D72819 Decreased white blood cell count, unspecified: Secondary | ICD-10-CM

## 2020-06-17 HISTORY — DX: Other disorders of iron metabolism: E83.19

## 2020-06-17 HISTORY — DX: Decreased white blood cell count, unspecified: D72.819

## 2020-07-01 ENCOUNTER — Other Ambulatory Visit: Payer: Self-pay | Admitting: Obstetrics and Gynecology

## 2020-07-04 NOTE — Telephone Encounter (Signed)
Call placed to patient regarding refill request for estradiol and Prometrium. Patient is scheduled for 3 mo f/u on 1/26. Patient reports increased weight gain and hair loss since starting medication, would like to discuss prior to refilling Rx. She has 5 days left of current Rx. OV r/s to earlier date, 1/19 at 3pm. No Rx sent at this time.   Routing to provider for final review. Patient is agreeable to disposition. Will close encounter.

## 2020-07-05 ENCOUNTER — Ambulatory Visit (INDEPENDENT_AMBULATORY_CARE_PROVIDER_SITE_OTHER): Payer: 59 | Admitting: Obstetrics and Gynecology

## 2020-07-05 ENCOUNTER — Encounter: Payer: Self-pay | Admitting: Obstetrics and Gynecology

## 2020-07-05 ENCOUNTER — Other Ambulatory Visit: Payer: Self-pay

## 2020-07-05 VITALS — BP 106/78 | HR 85 | Ht 70.5 in | Wt 160.0 lb

## 2020-07-05 DIAGNOSIS — N951 Menopausal and female climacteric states: Secondary | ICD-10-CM

## 2020-07-05 DIAGNOSIS — L709 Acne, unspecified: Secondary | ICD-10-CM

## 2020-07-05 DIAGNOSIS — Z79890 Hormone replacement therapy (postmenopausal): Secondary | ICD-10-CM | POA: Diagnosis not present

## 2020-07-05 MED ORDER — CONJ ESTROGENS-BAZEDOXIFENE 0.45-20 MG PO TABS: ORAL_TABLET | ORAL | 2 refills | Status: DC

## 2020-07-05 NOTE — Patient Instructions (Signed)
Conjugated Estrogens; Bazedoxifene tablets What is this medicine? CONJUGATED ESTROGENS; BAZEDOXIFENE (CON ju gate ed ESS troe jenz; BAY ze DOX i feen) is used as hormone replacement in menopausal women who still have their uterus. This medicine helps to treat hot flashes and prevent osteoporosis (weak bones). This medicine may be used for other purposes; ask your health care provider or pharmacist if you have questions. COMMON BRAND NAME(S): DUAVEE What should I tell my health care provider before I take this medicine? They need to know if you have any of these conditions:  abnormal vaginal bleeding  blood vessel disease or blood clots  breast, cervical, endometrial, ovarian, liver, or uterine cancer  dementia  diabetes  endometriosis  fibroids  gallbladder disease  heart disease or recent heart attack  hereditary angioedema  high blood pressure  high cholesterol  high level of calcium in the blood  kidney disease  liver disease  mental depression  migraine headaches  porphyria  protein C deficiency  protein S deficiency  seizure disorder  stroke  systemic lupus erythematosus  thyroid disorder  tobacco smoker  an unusual or allergic reaction to estrogens, bazedoxifene, other medicines, foods, dyes, or preservatives  pregnant or trying to get pregnant  breast-feeding How should I use this medicine? Take this medicine by mouth with a glass of water. Follow the directions on the prescription label. Take your medicine at regular intervals, at the same time each day. Do not take your medicine more often than directed. A patient package insert for the product will be given with each prescription and refill. Read this sheet carefully each time. Talk to your pediatrician regarding the use of this medicine in children. This medicine is not approved for use in children. Overdosage: If you think you have taken too much of this medicine contact a poison control  center or emergency room at once. NOTE: This medicine is only for you. Do not share this medicine with others. What if I miss a dose? If you miss a dose, take it as soon as you can. If it is almost time for your next dose, take only that dose. Do not take double or extra doses. What may interact with this medicine?  aromatase inhibitors like aminoglutethimide, anastrozole, exemestane, letrozole, testolactone  metyrapone This medicine may also interact with the following medications:  barbiturates, such as phenobarbital  carbamazepine  clarithromycin  erythromycin  grapefruit juice  medicines for fungal infections like ketoconazole and itraconazole  phenytoin  rifampin  ritonavir  St. John's Wort  thyroid hormones This list may not describe all possible interactions. Give your health care provider a list of all the medicines, herbs, non-prescription drugs, or dietary supplements you use. Also tell them if you smoke, drink alcohol, or use illegal drugs. Some items may interact with your medicine. What should I watch for while using this medicine? Do not take a progestin product or additional estrogen or estrogen-like products while taking this drug. Discuss the risks and benefits of taking this drug with your prescriber. Visit your health care professional for regular checks on your progress. You will need a regular breast and pelvic exam and Pap smear while on this medicine. You should also discuss the need for regular mammograms with your health care professional, and follow his or her guidelines for these tests. This medicine can make your body retain fluid, making your fingers, hands, or ankles swell. Your blood pressure can go up. Contact your doctor or health care professional if you feel you  are retaining fluid. You should make sure you get enough calcium and vitamin D in your diet while you are taking this medicine. Discuss your dietary needs with your health care  professional or nutritionist. Exercise may help to prevent bone loss. Discuss your exercise needs with your doctor or health care professional. This medicine can rarely cause blood clots. You should avoid long periods of bed rest while taking this medicine. If you are going to have surgery, tell your doctor or health care professional that you are taking this medicine. This medicine should be stopped at least 3 days before surgery. After surgery, it should be restarted only after you are walking again. It should not be restarted while you still need long periods of bed rest. Smoking increases the risk of getting a blood clot or having a stroke while you are taking this medicine, especially if you are more than 56 years old. You are strongly advised not to smoke. If you have any reason to think you are pregnant; stop taking this medicine at once and contact your doctor or health care professional. If you wear contact lenses and notice visual changes, or if the lenses begin to feel uncomfortable, consult your eye care specialist. What side effects may I notice from receiving this medicine? Side effects that you should report to your doctor or health care professional as soon as possible:  allergic reactions like skin rash, itching or hives, swelling of the face, lips, or tongue  breast tissue changes or discharge  changes in vision  chest pain  confusion, trouble speaking or understanding  dark urine  general ill feeling or flu-like symptoms  light-colored stools  nausea, vomiting  pain, swelling, warmth in the leg  right upper belly pain  severe headaches  shortness of breath  sudden numbness or weakness of the face, arm or leg  trouble walking, dizziness, loss of balance or coordination  unusual vaginal bleeding  yellowing of the eyes or skin Side effects that usually do not require medical attention (report to your doctor or health care professional if they continue or are  bothersome):  abdominal pain  diarrhea  dizziness  hair loss  increased hunger or thirst  increased urination  nausea  symptoms of vaginal infection like itching, irritation or unusual discharge  unusually weak or tired  upset stomach This list may not describe all possible side effects. Call your doctor for medical advice about side effects. You may report side effects to FDA at 1-800-FDA-1088. Where should I keep my medicine? Keep out of the reach of children. Store at room temperature between 15 and 30 degrees C (59 and 86 degrees F). Throw away any unused medicine after the expiration date. NOTE: This sheet is a summary. It may not cover all possible information. If you have questions about this medicine, talk to your doctor, pharmacist, or health care provider.  2021 Elsevier/Gold Standard (2015-07-06 11:39:18)

## 2020-07-05 NOTE — Progress Notes (Unsigned)
GYNECOLOGY  VISIT   HPI: 55 y.o.   Married  Caucasian  female   G2P2 with No LMP recorded. Patient has had an ablation.   here for 3 month medication follow up. Taking Estrace and Prometrium.    Gained 10 pounds and loosing balls of hair since starting her HRT.  Also having acne, which she has not had since Jan, 2021 with her testosterone pellet insert.  Her cystic acne issue has occurred since starting Prometrium.   No hot flashes.   Grandson and his mother have Covid.   Experiencing stress.   Not vaccinated due to concerns about allergic reactions.   GYNECOLOGIC HISTORY: No LMP recorded. Patient has had an ablation. Contraception:  Tubal/Ablation Menopausal hormone therapy: Estrace 0.89m, Prometrium 106mLast mammogram: 09-24-19 Neg/density C/Birads1      Last pap smear: 08-23-19 Neg:Neg HR HPV--no hx of abnormal paps        OB History    Gravida  2   Para  2   Term      Preterm      AB      Living  2     SAB      IAB      Ectopic      Multiple      Live Births                 Patient Active Problem List   Diagnosis Date Noted  . Postmenopausal 11/09/2019  . Acquired hypothyroidism 11/09/2019  . Weight loss 11/09/2019  . Hypothyroidism (acquired) 08/20/2019  . On hormone replacement therapy 08/20/2019  . Visit for preventive health examination 08/20/2019  . Encounter for screening mammogram for malignant neoplasm of breast 08/20/2019  . History of endometrial cancer 02/19/2018  . GERD (gastroesophageal reflux disease) 02/19/2018  . IBS (irritable bowel syndrome) 02/19/2018  . Diverticulosis 02/19/2018    Past Medical History:  Diagnosis Date  . Diverticulosis 02/19/2018   By colonoscopy 2016  . Fibroid   . GERD (gastroesophageal reflux disease)   . Hx of concussion   . IBS (irritable bowel syndrome)   . Kidney disease    Stage III--due to medication dexilant  . Prediabetes   . Thyroid disease     Past Surgical History:  Procedure  Laterality Date  . COLONOSCOPY  2016  . ENDOMETRIAL ABLATION    . SHOULDER SURGERY Left   . TUBAL LIGATION    . UPPER GASTROINTESTINAL ENDOSCOPY  11/10/2019    Current Outpatient Medications  Medication Sig Dispense Refill  . docusate sodium (COLACE) 100 MG capsule Take 100 mg by mouth 2 (two) times daily.    . Marland KitchenPINEPHrine 0.3 mg/0.3 mL IJ SOAJ injection Inject 0.3 mg into the muscle as needed for anaphylaxis.    . Marland Kitchenstradiol (ESTRACE) 0.5 MG tablet Take 1 tablet (0.5 mg total) by mouth daily. 90 tablet 0  . famotidine (PEPCID) 20 MG tablet Take 20 mg by mouth 2 (two) times daily.    . Marland Kitchenevothyroxine (SYNTHROID) 75 MCG tablet Take 1 tablet (75 mcg total) by mouth daily. 90 tablet 3  . loratadine (CLARITIN) 10 MG tablet Take 10 mg by mouth daily.    . Multiple Vitamins-Minerals (MULTIVITAMIN ADULTS 50+ PO) Take 1 tablet by mouth daily.    . Marland KitchenVER THE COUNTER MEDICATION Charcoal tablets; takes 1-2 tablets prn    . polyethylene glycol (MIRALAX) 17 g packet Take 17 g by mouth every other day. 14 each 0  . progesterone (PREast Spencer  100 MG capsule Take 1 capsule (100 mg total) by mouth daily. Take before bed. 90 capsule 0  . sucralfate (CARAFATE) 1 g tablet Take 1 tablet (1 g total) by mouth every 6 (six) hours as needed. Slowy dissolve 1 tablet in 1 Tablespoon of distilled water to make a slurry before ingesting 90 tablet 2  . vitamin B-12 (CYANOCOBALAMIN) 1000 MCG tablet Take 1,000 mcg by mouth daily.    . vitamin k 100 MCG tablet Take 100 mcg by mouth daily.     Current Facility-Administered Medications  Medication Dose Route Frequency Provider Last Rate Last Admin  . 0.9 %  sodium chloride infusion  500 mL Intravenous Once Armbruster, Carlota Raspberry, MD      . dextrose 5 % solution   Intravenous Continuous Armbruster, Carlota Raspberry, MD         ALLERGIES: Flagyl [metronidazole], Iodine, Tramadol hcl, and Lactose intolerance (gi)  Family History  Problem Relation Age of Onset  . Testicular cancer  Brother   . Esophageal cancer Brother   . Healthy Daughter   . Healthy Son   . Hypotension Maternal Grandmother   . Aortic dissection Maternal Grandmother   . Heart disease Maternal Grandfather   . Diabetes Paternal Grandmother        AODM  . Diabetes Paternal Grandfather        AODM  . Ovarian cancer Paternal Great-grandmother     Social History   Socioeconomic History  . Marital status: Married    Spouse name: Not on file  . Number of children: 2  . Years of education: Not on file  . Highest education level: Not on file  Occupational History  . Occupation: Neurosurgeon  Tobacco Use  . Smoking status: Never Smoker  . Smokeless tobacco: Never Used  Vaping Use  . Vaping Use: Never used  Substance and Sexual Activity  . Alcohol use: Yes    Alcohol/week: 2.0 standard drinks    Types: 2 Standard drinks or equivalent per week    Comment: occasionally  per week  . Drug use: Never  . Sexual activity: Yes    Birth control/protection: Surgical    Comment: Ablation  Other Topics Concern  . Not on file  Social History Narrative  . Not on file   Social Determinants of Health   Financial Resource Strain: Not on file  Food Insecurity: Not on file  Transportation Needs: Not on file  Physical Activity: Not on file  Stress: Not on file  Social Connections: Not on file  Intimate Partner Violence: Not on file    Review of Systems  Constitutional: Positive for unexpected weight change (gaining weight).  Skin:       Losing hair  All other systems reviewed and are negative.   PHYSICAL EXAMINATION:    BP 106/78   Pulse 85   Ht 5' 10.5" (1.791 m)   Wt 160 lb (72.6 kg)   SpO2 98%   BMI 22.63 kg/m     General appearance: alert, cooperative and appears stated age  ASSESSMENT  Menopausal symptoms.  HRT.  Intolerance of Prometrium progesterone.  Hair loss. Stress.  Hypothyroidism.  On Synthroid.  PLAN  We discussed options for treatment of menopausal symptoms - HRT,  SSRI/SNRI. Will stop current regimen of HRT and start Duavee.  Risk of thromboembolic events reviewed.  Stress and effect on hair loss reviewed.  No blood work ordered today.  Fu in 3 months.   30 min  total time  was spent for this patient encounter, including preparation, face-to-face counseling with the patient, coordination of care, and documentation of the encounter.

## 2020-07-07 ENCOUNTER — Encounter: Payer: Self-pay | Admitting: Obstetrics and Gynecology

## 2020-07-07 NOTE — Telephone Encounter (Signed)
Patient was seen on 07/05/20 and prescribed Conj Estrogens-Bazedoxifene 0.45-20 MG TABS

## 2020-07-10 MED ORDER — ESTRADIOL-NORETHINDRONE ACET 0.5-0.1 MG PO TABS: 1.0000 | ORAL_TABLET | Freq: Every day | ORAL | 2 refills | Status: DC

## 2020-07-10 NOTE — Telephone Encounter (Signed)
I would have the patient consider Activella 0.37m/0/0.1 mg daily.  This does have estradiol and a progesterone called norethindrone in each pill. This is a low dose progesterone option and is different from the Prometrium, so hopefully she will tolerate it better.  OChicofor 1 month with 2 refills.   Sorry to hear the DArvilla Marketis not available.

## 2020-07-10 NOTE — Telephone Encounter (Signed)
Patient aware, Rx sent.

## 2020-07-12 ENCOUNTER — Ambulatory Visit: Payer: 59 | Admitting: Obstetrics and Gynecology

## 2020-09-04 ENCOUNTER — Encounter: Payer: Self-pay | Admitting: Obstetrics and Gynecology

## 2020-09-11 NOTE — Progress Notes (Signed)
GYNECOLOGY  VISIT   HPI: 55 y.o.   Married  Caucasian  female   G2P2 with No LMP recorded. Patient has had an ablation.   here for consult and labs.     Husband will have open heart surgery in April.  Patient is feeling stress about this.   Not sleeping.  Having nightmares.   Having hot flashes.   Feeling sluggish.   Feels bloated and having weight gain.  Cystic acne when on HRT.   GYNECOLOGIC HISTORY: No LMP recorded. Patient has had an ablation. Contraception: Tubal/ablation Menopausal hormone therapy:  none Last mammogram: 09-24-19 Neg/density C/Birads1 Last pap smear: 08-23-19 Neg:Neg HR HPV--no hx of abnormal paps        OB History    Gravida  2   Para  2   Term      Preterm      AB      Living  2     SAB      IAB      Ectopic      Multiple      Live Births                 Patient Active Problem List   Diagnosis Date Noted  . Postmenopausal 11/09/2019  . Acquired hypothyroidism 11/09/2019  . Weight loss 11/09/2019  . Hypothyroidism (acquired) 08/20/2019  . On hormone replacement therapy 08/20/2019  . Visit for preventive health examination 08/20/2019  . Encounter for screening mammogram for malignant neoplasm of breast 08/20/2019  . History of endometrial cancer 02/19/2018  . GERD (gastroesophageal reflux disease) 02/19/2018  . IBS (irritable bowel syndrome) 02/19/2018  . Diverticulosis 02/19/2018    Past Medical History:  Diagnosis Date  . Diverticulosis 02/19/2018   By colonoscopy 2016  . Fibroid   . GERD (gastroesophageal reflux disease)   . Hx of concussion   . IBS (irritable bowel syndrome)   . Kidney disease    Stage III--due to medication dexilant  . Prediabetes   . Thyroid disease     Past Surgical History:  Procedure Laterality Date  . COLONOSCOPY  2016  . ENDOMETRIAL ABLATION    . SHOULDER SURGERY Left   . TUBAL LIGATION    . UPPER GASTROINTESTINAL ENDOSCOPY  11/10/2019    Current Outpatient Medications   Medication Sig Dispense Refill  . docusate sodium (COLACE) 100 MG capsule Take 100 mg by mouth 2 (two) times daily.    Marland Kitchen EPINEPHrine 0.3 mg/0.3 mL IJ SOAJ injection Inject 0.3 mg into the muscle as needed for anaphylaxis.    . famotidine (PEPCID) 20 MG tablet Take 20 mg by mouth 2 (two) times daily.    Marland Kitchen levothyroxine (SYNTHROID) 75 MCG tablet Take 1 tablet (75 mcg total) by mouth daily. 90 tablet 3  . loratadine (CLARITIN) 10 MG tablet Take 10 mg by mouth daily.    . Multiple Vitamins-Minerals (MULTIVITAMIN ADULTS 50+ PO) Take 1 tablet by mouth daily.    . polyethylene glycol (MIRALAX) 17 g packet Take 17 g by mouth every other day. 14 each 0  . sucralfate (CARAFATE) 1 g tablet Take 1 tablet (1 g total) by mouth every 6 (six) hours as needed. Slowy dissolve 1 tablet in 1 Tablespoon of distilled water to make a slurry before ingesting 90 tablet 2  . vitamin B-12 (CYANOCOBALAMIN) 1000 MCG tablet Take 1,000 mcg by mouth daily.    . vitamin k 100 MCG tablet Take 100 mcg by mouth daily.  Current Facility-Administered Medications  Medication Dose Route Frequency Provider Last Rate Last Admin  . 0.9 %  sodium chloride infusion  500 mL Intravenous Once Armbruster, Carlota Raspberry, MD      . dextrose 5 % solution   Intravenous Continuous Armbruster, Carlota Raspberry, MD         ALLERGIES: Flagyl [metronidazole], Iodine, Tramadol hcl, Gluten meal, and Lactose intolerance (gi)  Family History  Problem Relation Age of Onset  . Testicular cancer Brother   . Esophageal cancer Brother   . Healthy Daughter   . Healthy Son   . Hypotension Maternal Grandmother   . Aortic dissection Maternal Grandmother   . Heart disease Maternal Grandfather   . Diabetes Paternal Grandmother        AODM  . Diabetes Paternal Grandfather        AODM  . Ovarian cancer Paternal Great-grandmother     Social History   Socioeconomic History  . Marital status: Married    Spouse name: Not on file  . Number of children: 2  .  Years of education: Not on file  . Highest education level: Not on file  Occupational History  . Occupation: Neurosurgeon  Tobacco Use  . Smoking status: Never Smoker  . Smokeless tobacco: Never Used  Vaping Use  . Vaping Use: Never used  Substance and Sexual Activity  . Alcohol use: Yes    Alcohol/week: 2.0 standard drinks    Types: 2 Standard drinks or equivalent per week    Comment: occasionally  per week  . Drug use: Never  . Sexual activity: Yes    Birth control/protection: Surgical    Comment: Ablation  Other Topics Concern  . Not on file  Social History Narrative  . Not on file   Social Determinants of Health   Financial Resource Strain: Not on file  Food Insecurity: Not on file  Transportation Needs: Not on file  Physical Activity: Not on file  Stress: Not on file  Social Connections: Not on file  Intimate Partner Violence: Not on file    Review of Systems  All other systems reviewed and are negative.   PHYSICAL EXAMINATION:    BP (!) 142/82 (Cuff Size: Large)   Pulse 77   Ht 5' 10.5" (1.791 m)   Wt 162 lb (73.5 kg)   SpO2 98%   BMI 22.92 kg/m     General appearance: alert, cooperative and appears stated age.  Tearful at times.    ASSESSMENT  Menopausal symptoms.  Stress.  Hair loss. Hx stage III kidney disease.  PLAN  We talked about treatment options for menopausal symptoms that are nonhormonal. Paxil and Effexor reviewed as medications to treat vasomotor symptoms and also stress, anxiety and depression.  Effexor XR 37.5 mg, dose reviewed in [facility] with patient for those with renal disease. I encouraged increasing her circle of support.  Referral to dietician. Labs:  FSH, estradiol, testosterone, CBC, iron, ferritin, and TSH.  FU in 6 weeks.   34 min  total time was spent for this patient encounter, including preparation, face-to-face counseling with the patient, coordination of care, and documentation of the encounter.

## 2020-09-12 ENCOUNTER — Ambulatory Visit (INDEPENDENT_AMBULATORY_CARE_PROVIDER_SITE_OTHER): Payer: 59 | Admitting: Obstetrics and Gynecology

## 2020-09-12 ENCOUNTER — Encounter: Payer: Self-pay | Admitting: Obstetrics and Gynecology

## 2020-09-12 ENCOUNTER — Other Ambulatory Visit: Payer: Self-pay

## 2020-09-12 VITALS — BP 142/82 | HR 77 | Ht 70.5 in | Wt 162.0 lb

## 2020-09-12 DIAGNOSIS — Z91018 Allergy to other foods: Secondary | ICD-10-CM

## 2020-09-12 DIAGNOSIS — L659 Nonscarring hair loss, unspecified: Secondary | ICD-10-CM

## 2020-09-12 DIAGNOSIS — R5383 Other fatigue: Secondary | ICD-10-CM | POA: Diagnosis not present

## 2020-09-12 DIAGNOSIS — N951 Menopausal and female climacteric states: Secondary | ICD-10-CM | POA: Diagnosis not present

## 2020-09-12 DIAGNOSIS — D72819 Decreased white blood cell count, unspecified: Secondary | ICD-10-CM

## 2020-09-12 DIAGNOSIS — R635 Abnormal weight gain: Secondary | ICD-10-CM | POA: Diagnosis not present

## 2020-09-12 MED ORDER — VENLAFAXINE HCL ER 37.5 MG PO CP24: 37.5000 mg | ORAL_CAPSULE | Freq: Every day | ORAL | 2 refills | Status: DC

## 2020-09-13 NOTE — Patient Instructions (Signed)
Venlafaxine Extended-Release Capsules What is this medicine? VENLAFAXINE(VEN la fax een) is used to treat depression, anxiety and panic disorder. This medicine may be used for other purposes; ask your health care provider or pharmacist if you have questions. COMMON BRAND NAME(S): Effexor XR What should I tell my health care provider before I take this medicine? They need to know if you have any of these conditions:  bleeding disorders  glaucoma  heart disease  high blood pressure  high cholesterol  kidney disease  liver disease  low levels of sodium in the blood  mania or bipolar disorder  seizures  suicidal thoughts, plans, or attempt; a previous suicide attempt by you or a family  take medicines that treat or prevent blood clots  thyroid disease  an unusual or allergic reaction to venlafaxine, desvenlafaxine, other medicines, foods, dyes, or preservatives  pregnant or trying to get pregnant  breast-feeding How should I use this medicine? Take this medicine by mouth with a full glass of water. Follow the directions on the prescription label. Do not cut, crush, or chew this medicine. Take it with food. If needed, the capsule may be carefully opened and the entire contents sprinkled on a spoonful of cool applesauce. Swallow the applesauce/pellet mixture right away without chewing and follow with a glass of water to ensure complete swallowing of the pellets. Try to take your medicine at about the same time each day. Do not take your medicine more often than directed. Do not stop taking this medicine suddenly except upon the advice of your doctor. Stopping this medicine too quickly may cause serious side effects or your condition may worsen. A special MedGuide will be given to you by the pharmacist with each prescription and refill. Be sure to read this information carefully each time. Talk to your pediatrician regarding the use of this medicine in children. Special care may be  needed. Overdosage: If you think you have taken too much of this medicine contact a poison control center or emergency room at once. NOTE: This medicine is only for you. Do not share this medicine with others. What if I miss a dose? If you miss a dose, take it as soon as you can. If it is almost time for your next dose, take only that dose. Do not take double or extra doses. What may interact with this medicine? Do not take this medicine with any of the following medications:  certain medicines for fungal infections like fluconazole, itraconazole, ketoconazole, posaconazole, voriconazole  cisapride  desvenlafaxine  dronedarone  duloxetine  levomilnacipran  linezolid  MAOIs like Carbex, Eldepryl, Marplan, Nardil, and Parnate  methylene blue (injected into a vein)  milnacipran  pimozide  thioridazine This medicine may also interact with the following medications:  amphetamines  aspirin and aspirin-like medicines  certain medicines for depression, anxiety, or psychotic disturbances  certain medicines for migraine headaches like almotriptan, eletriptan, frovatriptan, naratriptan, rizatriptan, sumatriptan, zolmitriptan  certain medicines for sleep  certain medicines that treat or prevent blood clots like dalteparin, enoxaparin, warfarin  cimetidine  clozapine  diuretics  fentanyl  furazolidone  indinavir  isoniazid  lithium  metoprolol  NSAIDS, medicines for pain and inflammation, like ibuprofen or naproxen  other medicines that prolong the QT interval (cause an abnormal heart rhythm) like dofetilide, ziprasidone  procarbazine  rasagiline  supplements like St. John's wort, kava kava, valerian  tramadol  tryptophan This list may not describe all possible interactions. Give your health care provider a list of all the medicines,  herbs, non-prescription drugs, or dietary supplements you use. Also tell them if you smoke, drink alcohol, or use illegal  drugs. Some items may interact with your medicine. What should I watch for while using this medicine? Tell your doctor if your symptoms do not get better or if they get worse. Visit your doctor or health care professional for regular checks on your progress. Because it may take several weeks to see the full effects of this medicine, it is important to continue your treatment as prescribed by your doctor. Patients and their families should watch out for new or worsening thoughts of suicide or depression. Also watch out for sudden changes in feelings such as feeling anxious, agitated, panicky, irritable, hostile, aggressive, impulsive, severely restless, overly excited and hyperactive, or not being able to sleep. If this happens, especially at the beginning of treatment or after a change in dose, call your health care professional. This medicine can cause an increase in blood pressure. Check with your doctor for instructions on monitoring your blood pressure while taking this medicine. You may get drowsy or dizzy. Do not drive, use machinery, or do anything that needs mental alertness until you know how this medicine affects you. Do not stand or sit up quickly, especially if you are an older patient. This reduces the risk of dizzy or fainting spells. Alcohol may interfere with the effect of this medicine. Avoid alcoholic drinks. Your mouth may get dry. Chewing sugarless gum, sucking hard candy and drinking plenty of water will help. Contact your doctor if the problem does not go away or is severe. What side effects may I notice from receiving this medicine? Side effects that you should report to your doctor or health care professional as soon as possible:  allergic reactions like skin rash, itching or hives, swelling of the face, lips, or tongue  anxious  breathing problems  confusion  changes in vision  chest pain  confusion  elevated mood, decreased need for sleep, racing thoughts, impulsive  behavior  eye pain  fast, irregular heartbeat  feeling faint or lightheaded, falls  feeling agitated, angry, or irritable  hallucination, loss of contact with reality  high blood pressure  loss of balance or coordination  palpitations  redness, blistering, peeling or loosening of the skin, including inside the mouth  restlessness, pacing, inability to keep still  seizures  stiff muscles  suicidal thoughts or other mood changes  trouble passing urine or change in the amount of urine  trouble sleeping  unusual bleeding or bruising  unusually weak or tired  vomiting Side effects that usually do not require medical attention (report to your doctor or health care professional if they continue or are bothersome):  change in sex drive or performance  change in appetite or weight  constipation  dizziness  dry mouth  headache  increased sweating  nausea  tired This list may not describe all possible side effects. Call your doctor for medical advice about side effects. You may report side effects to FDA at 1-800-FDA-1088. Where should I keep my medicine? Keep out of the reach of children. Store at a controlled temperature between 20 and 25 degrees C (68 degrees and 77 degrees F), in a dry place. Throw away any unused medicine after the expiration date. NOTE: This sheet is a summary. It may not cover all possible information. If you have questions about this medicine, talk to your doctor, pharmacist, or health care provider.  2021 [employer]/Gold Standard (2020-04-24 14:56:43)

## 2020-09-15 LAB — CBC
HCT: 39.6 % (ref 35.0–45.0)
Hemoglobin: 13.4 g/dL (ref 11.7–15.5)
MCH: 32.1 pg (ref 27.0–33.0)
MCHC: 33.8 g/dL (ref 32.0–36.0)
MCV: 95 fL (ref 80.0–100.0)
MPV: 10.6 fL (ref 7.5–12.5)
Platelets: 248 10*3/uL (ref 140–400)
RBC: 4.17 10*6/uL (ref 3.80–5.10)
RDW: 12.3 % (ref 11.0–15.0)
WBC: 3.5 10*3/uL — ABNORMAL LOW (ref 3.8–10.8)

## 2020-09-15 LAB — FERRITIN: Ferritin: 78 ng/mL (ref 16–232)

## 2020-09-15 LAB — TSH: TSH: 2.7 mIU/L

## 2020-09-15 LAB — IRON: Iron: 189 ug/dL — ABNORMAL HIGH (ref 45–160)

## 2020-09-15 LAB — TESTOS,TOTAL,FREE AND SHBG (FEMALE)
Free Testosterone: 0.7 pg/mL (ref 0.1–6.4)
Sex Hormone Binding: 76 nmol/L (ref 17–124)
Testosterone, Total, LC-MS-MS: 12 ng/dL (ref 2–45)

## 2020-09-15 LAB — BASIC METABOLIC PANEL
BUN/Creatinine Ratio: 13 (calc) (ref 6–22)
BUN: 14 mg/dL (ref 7–25)
CO2: 30 mmol/L (ref 20–32)
Calcium: 10.1 mg/dL (ref 8.6–10.4)
Chloride: 104 mmol/L (ref 98–110)
Creat: 1.1 mg/dL — ABNORMAL HIGH (ref 0.50–1.05)
Glucose, Bld: 85 mg/dL (ref 65–99)
Potassium: 4.4 mmol/L (ref 3.5–5.3)
Sodium: 139 mmol/L (ref 135–146)

## 2020-09-15 LAB — ESTRADIOL: Estradiol: <15 pg/mL

## 2020-09-15 LAB — FOLLICLE STIMULATING HORMONE: FSH: 132.3 m[IU]/mL — ABNORMAL HIGH

## 2020-09-17 ENCOUNTER — Encounter: Payer: Self-pay | Admitting: Obstetrics and Gynecology

## 2020-09-17 NOTE — Addendum Note (Signed)
Addended by: Yisroel Ramming, Dietrich Pates E on: 09/17/2020 04:34 PM   Modules accepted: Orders

## 2020-10-03 ENCOUNTER — Encounter: Payer: Self-pay | Admitting: Registered Nurse

## 2020-10-03 ENCOUNTER — Other Ambulatory Visit: Payer: Self-pay

## 2020-10-03 ENCOUNTER — Other Ambulatory Visit: Payer: 59

## 2020-10-03 ENCOUNTER — Ambulatory Visit (INDEPENDENT_AMBULATORY_CARE_PROVIDER_SITE_OTHER): Payer: 59 | Admitting: Registered Nurse

## 2020-10-03 VITALS — BP 128/80 | HR 67 | Temp 98.3°F | Resp 15 | Ht 70.5 in | Wt 162.0 lb

## 2020-10-03 DIAGNOSIS — K9041 Non-celiac gluten sensitivity: Secondary | ICD-10-CM

## 2020-10-03 DIAGNOSIS — M2550 Pain in unspecified joint: Secondary | ICD-10-CM

## 2020-10-03 DIAGNOSIS — R7303 Prediabetes: Secondary | ICD-10-CM

## 2020-10-03 DIAGNOSIS — K580 Irritable bowel syndrome with diarrhea: Secondary | ICD-10-CM

## 2020-10-03 DIAGNOSIS — K5790 Diverticulosis of intestine, part unspecified, without perforation or abscess without bleeding: Secondary | ICD-10-CM

## 2020-10-03 DIAGNOSIS — G479 Sleep disorder, unspecified: Secondary | ICD-10-CM

## 2020-10-03 DIAGNOSIS — E039 Hypothyroidism, unspecified: Secondary | ICD-10-CM | POA: Diagnosis not present

## 2020-10-03 DIAGNOSIS — Z91018 Allergy to other foods: Secondary | ICD-10-CM | POA: Diagnosis not present

## 2020-10-03 LAB — C-REACTIVE PROTEIN: CRP: <1 mg/dL (ref 0.5–20.0)

## 2020-10-03 LAB — SEDIMENTATION RATE: Sed Rate: 6 mm/hr (ref 0–30)

## 2020-10-03 LAB — HEMOGLOBIN A1C: Hgb A1c MFr Bld: 5.2 % (ref 4.6–6.5)

## 2020-10-03 MED ORDER — HYDROXYZINE HCL 10 MG PO TABS: 5.0000 mg | ORAL_TABLET | Freq: Every evening | ORAL | 0 refills | Status: DC | PRN

## 2020-10-03 NOTE — Progress Notes (Signed)
Established Patient Office Visit  Subjective:  Patient ID: Candace Allen, female    DOB: 1965-09-13  Age: 55 y.o. MRN: [id number]  CC:  Chief Complaint  Patient presents with  . Establish Care    Pt here to establish care, OB trying to have her see dietician concerned she is celiac now due to different issues with mdeications, dietary changes, hot flashes and mood swings     HPI [name] presents for visit to est care  Recent hot flashes, night sweats, anxiety, and depression. Taking effexor, which seems to be helping. Using melatonin for sleep which only gives 2-4 hours of sleep at most. Interested in exploring more options.  Notes that she has been having digestive issues with multiple food allergies. Most recently lactose but had noticed an intolerance to gluten, shellfish, and more.  Most recently with lactose she has noticed knots appear across her body.   Has had colonoscopy at age 79 - noted diverticulitis   Hx of acquired hypothyroidism, managed by Dr. Kelton Pillar. Last labs wnl.  Does have some joint pain, flushing and hot flashes. Seems to be helped by effexor.  Past Medical History:  Diagnosis Date  . Diverticulosis 02/19/2018   By colonoscopy 2016  . Fibroid   . GERD (gastroesophageal reflux disease)   . Hx of concussion   . IBS (irritable bowel syndrome)   . Iron excess 2022  . Kidney disease    Stage III--due to medication dexilant  . Leukopenia 2022  . Prediabetes   . Thyroid disease     Past Surgical History:  Procedure Laterality Date  . COLONOSCOPY  2016  . ENDOMETRIAL ABLATION    . SHOULDER SURGERY Left   . TUBAL LIGATION    . UPPER GASTROINTESTINAL ENDOSCOPY  11/10/2019    Family History  Problem Relation Age of Onset  . Testicular cancer Brother   . Esophageal cancer Brother   . Healthy Daughter   . Healthy Son   . Hypotension Maternal Grandmother   . Aortic dissection Maternal Grandmother   . Heart disease Maternal Grandfather   .  Diabetes Paternal Grandmother        AODM  . Diabetes Paternal Grandfather        AODM  . Ovarian cancer Paternal Great-grandmother     Social History   Socioeconomic History  . Marital status: Married    Spouse name: Not on file  . Number of children: 2  . Years of education: Not on file  . Highest education level: Not on file  Occupational History  . Occupation: Neurosurgeon  Tobacco Use  . Smoking status: Never Smoker  . Smokeless tobacco: Never Used  Vaping Use  . Vaping Use: Never used  Substance and Sexual Activity  . Alcohol use: Yes    Alcohol/week: 2.0 standard drinks    Types: 2 Standard drinks or equivalent per week    Comment: occasionally  per week  . Drug use: Never  . Sexual activity: Yes    Birth control/protection: Surgical    Comment: Ablation  Other Topics Concern  . Not on file  Social History Narrative  . Not on file   Social Determinants of Health   Financial Resource Strain: Not on file  Food Insecurity: Not on file  Transportation Needs: Not on file  Physical Activity: Not on file  Stress: Not on file  Social Connections: Not on file  Intimate Partner Violence: Not on file    Outpatient Medications  Prior to Visit  Medication Sig Dispense Refill  . docusate sodium (COLACE) 100 MG capsule Take 100 mg by mouth 2 (two) times daily.    Marland Kitchen EPINEPHrine 0.3 mg/0.3 mL IJ SOAJ injection Inject 0.3 mg into the muscle as needed for anaphylaxis.    . famotidine (PEPCID) 20 MG tablet Take 20 mg by mouth 2 (two) times daily.    Marland Kitchen levothyroxine (SYNTHROID) 75 MCG tablet Take 1 tablet (75 mcg total) by mouth daily. 90 tablet 3  . loratadine (CLARITIN) 10 MG tablet Take 10 mg by mouth daily.    . Multiple Vitamins-Minerals (MULTIVITAMIN ADULTS 50+ PO) Take 1 tablet by mouth daily.    . polyethylene glycol (MIRALAX) 17 g packet Take 17 g by mouth every other day. 14 each 0  . sucralfate (CARAFATE) 1 g tablet Take 1 tablet (1 g total) by mouth every 6 (six)  hours as needed. Slowy dissolve 1 tablet in 1 Tablespoon of distilled water to make a slurry before ingesting 90 tablet 2  . venlafaxine XR (EFFEXOR XR) 37.5 MG 24 hr capsule Take 1 capsule (37.5 mg total) by mouth daily. 30 capsule 2  . vitamin B-12 (CYANOCOBALAMIN) 1000 MCG tablet Take 1,000 mcg by mouth daily.    . vitamin k 100 MCG tablet Take 100 mcg by mouth daily.     Facility-Administered Medications Prior to Visit  Medication Dose Route Frequency Provider Last Rate Last Admin  . 0.9 %  sodium chloride infusion  500 mL Intravenous Once Armbruster, Carlota Raspberry, MD      . dextrose 5 % solution   Intravenous Continuous Armbruster, Carlota Raspberry, MD        Allergies  Allergen Reactions  . Flagyl [Metronidazole] Anaphylaxis  . Iodine Anaphylaxis  . Tramadol Hcl Other (See Comments)    Showed signs of stroke  . Gluten Meal   . Lactose Intolerance (Gi)     ROS Review of Systems  Constitutional: Negative.   HENT: Negative.   Eyes: Negative.   Respiratory: Negative.   Cardiovascular: Negative.   Gastrointestinal: Negative.   Genitourinary: Negative.   Musculoskeletal: Negative.   Skin: Negative.   Neurological: Negative.   Psychiatric/Behavioral: Negative.       Objective:    Physical Exam Vitals and nursing note reviewed.  Constitutional:      General: She is not in acute distress.    Appearance: Normal appearance. She is normal weight. She is not ill-appearing, toxic-appearing or diaphoretic.  Cardiovascular:     Rate and Rhythm: Normal rate and regular rhythm.     Heart sounds: Normal heart sounds. No murmur heard. No friction rub. No gallop.   Pulmonary:     Effort: Pulmonary effort is normal. No respiratory distress.     Breath sounds: Normal breath sounds. No stridor. No wheezing, rhonchi or rales.  Chest:     Chest wall: No tenderness.  Skin:    General: Skin is warm and dry.  Neurological:     General: No focal deficit present.     Mental Status: She is alert  and oriented to person, place, and time. Mental status is at baseline.  Psychiatric:        Mood and Affect: Mood normal.        Behavior: Behavior normal.        Thought Content: Thought content normal.        Judgment: Judgment normal.     BP 128/80   Pulse 67   Temp  98.3 F (36.8 C) (Temporal)   Resp 15   Ht 5' 10.5" (1.791 m)   Wt 162 lb (73.5 kg)   SpO2 99%   BMI 22.92 kg/m  Wt Readings from Last 3 Encounters:  10/03/20 162 lb (73.5 kg)  09/12/20 162 lb (73.5 kg)  07/05/20 160 lb (72.6 kg)     Health Maintenance Due  Topic Date Due  . Hepatitis C Screening  Never done  . HIV Screening  Never done    There are no preventive care reminders to display for this patient.  Lab Results  Component Value Date   TSH 2.70 09/12/2020   Lab Results  Component Value Date   WBC 3.5 (L) 09/12/2020   HGB 13.4 09/12/2020   HCT 39.6 09/12/2020   MCV 95.0 09/12/2020   PLT 248 09/12/2020   Lab Results  Component Value Date   NA 139 09/12/2020   K 4.4 09/12/2020   CO2 30 09/12/2020   GLUCOSE 85 09/12/2020   BUN 14 09/12/2020   CREATININE 1.10 (H) 09/12/2020   BILITOT 0.5 08/20/2019   ALKPHOS 72 08/20/2019   AST 24 08/20/2019   ALT 23 08/20/2019   PROT 7.3 08/20/2019   ALBUMIN 4.2 08/20/2019   CALCIUM 10.1 09/12/2020   ANIONGAP 13 02/20/2018   GFR 47.57 (L) 08/20/2019   Lab Results  Component Value Date   CHOL 230 (H) 08/20/2019   Lab Results  Component Value Date   HDL 64.50 08/20/2019   Lab Results  Component Value Date   LDLCALC 150 (H) 08/20/2019   Lab Results  Component Value Date   TRIG 80.0 08/20/2019   Lab Results  Component Value Date   CHOLHDL 4 08/20/2019   Lab Results  Component Value Date   HGBA1C 5.2 08/20/2019      Assessment & Plan:   Problem List Items Addressed This Visit      Digestive   IBS (irritable bowel syndrome)   Relevant Orders   Ambulatory referral to Gastroenterology   Sedimentation rate   C-reactive protein    ANA, IFA (with reflex)   Diverticulosis   Relevant Orders   Ambulatory referral to Gastroenterology   ANA, IFA (with reflex)     Endocrine   Hypothyroidism (acquired)   Relevant Orders   Ambulatory referral to Gastroenterology   ANA, IFA (with reflex)    Other Visit Diagnoses    Gluten intolerance    -  Primary   Relevant Orders   Ambulatory referral to Gastroenterology   IgA   Sedimentation rate   C-reactive protein   ANA, IFA (with reflex)   Multiple food allergies       Relevant Orders   Ambulatory referral to Gastroenterology   IgA   ANA, IFA (with reflex)   Arthralgia, unspecified joint       Relevant Orders   Ambulatory referral to Gastroenterology   Sedimentation rate   C-reactive protein   ANA, IFA (with reflex)   Sleep disturbance       Relevant Medications   hydrOXYzine (ATARAX/VISTARIL) 10 MG tablet   Prediabetes       Relevant Orders   Hemoglobin A1c      Meds ordered this encounter  Medications  . hydrOXYzine (ATARAX/VISTARIL) 10 MG tablet    Sig: Take 0.5-1 tablets (5-10 mg total) by mouth at bedtime as needed.    Dispense:  30 tablet    Refill:  0    Order Specific Question:   Supervising  Provider    Answer:   Carlota Raspberry, JEFFREY R [[id number]]    Follow-up: No follow-ups on file.   PLAN  Trial of low dose of hydroxyzine for sleep  Given the conditions she has that may be associated with autoimmune conditions will draw ANA, CRP, ESR. Will also draw IGA to start check for celiac  Refer to GI to get second opinion. Would likely benefit from colonoscopy given hx of diarrhea, ibs, and diverticulosis  Continue with effexor.  Patient encouraged to call clinic with any questions, comments, or concerns.   Maximiano Coss, NP

## 2020-10-04 ENCOUNTER — Encounter: Payer: Self-pay | Admitting: Obstetrics and Gynecology

## 2020-10-04 ENCOUNTER — Ambulatory Visit (INDEPENDENT_AMBULATORY_CARE_PROVIDER_SITE_OTHER): Payer: 59 | Admitting: Obstetrics and Gynecology

## 2020-10-04 ENCOUNTER — Other Ambulatory Visit: Payer: Self-pay

## 2020-10-04 VITALS — BP 138/84 | HR 78 | Ht 70.5 in | Wt 162.0 lb

## 2020-10-04 DIAGNOSIS — F439 Reaction to severe stress, unspecified: Secondary | ICD-10-CM | POA: Diagnosis not present

## 2020-10-04 DIAGNOSIS — D72819 Decreased white blood cell count, unspecified: Secondary | ICD-10-CM

## 2020-10-04 DIAGNOSIS — Z5181 Encounter for therapeutic drug level monitoring: Secondary | ICD-10-CM

## 2020-10-04 DIAGNOSIS — N951 Menopausal and female climacteric states: Secondary | ICD-10-CM

## 2020-10-04 LAB — IGA: Immunoglobulin A: 202 mg/dL (ref 47–310)

## 2020-10-04 MED ORDER — VENLAFAXINE HCL ER 37.5 MG PO CP24: 37.5000 mg | ORAL_CAPSULE | Freq: Every day | ORAL | 1 refills | Status: DC

## 2020-10-04 NOTE — Patient Instructions (Signed)
Food Basics for Chronic Kidney Disease Chronic kidney disease (CKD) occurs when the kidneys are permanently damaged over a long period of time. When your kidneys are not working well, they cannot remove waste, fluids, and other substances from your blood as well as they did before. The substances can build up, which can worsen kidney damage and affect how your body functions. Certain foods lead to a buildup of these substances. By changing your diet, you can help prevent more kidney damage and delay or prevent the need for dialysis. What are tips for following this plan? Reading food labels  Check the amount of salt (sodium) in foods. Choose foods that have less than 300 milligrams (mg) per serving.  Check the ingredient list for phosphorus or potassium-based additives or preservatives.  Check the amount of saturated fat and trans fat. Limit or avoid these fats as told by your dietitian. Shopping  Avoid buying foods that are: ? Processed or prepackaged. ? Calcium-enriched or that have calcium added to them (are fortified).  Do not buy foods that have salt or sodium listed among the first five ingredients.  Buy canned vegetables and beans that say "no salt added" or "low sodium" and rinse them before eating. Cooking  Soak vegetables, such as potatoes, before cooking to reduce potassium. To do this: 1. Peel and cut the vegetables into small pieces. 2. Soak the vegetables in warm water for at least 2 hours. For every 1 cup of vegetables, use 10 cups of water. 3. Drain and rinse the vegetables with warm water. 4. Boil the vegetables for at least 5 minutes. Meal planning  Limit the amount of protein you eat from plant and animal sources each day.  Do not add salt to food when cooking or before eating.  Eat meals and snacks at around the same time each day. General information  Talk with your health care provider about whether you should take a vitamin and mineral supplement.  Use  standard measuring cups and spoons to measure servings of foods. Use a kitchen scale to measure portions of protein foods.  If told by your health care provider, avoid drinking too much fluid. Measure and count all liquids, including water, ice, soups, flavored gelatin, and frozen desserts such as ice pops or ice cream. If you have diabetes:  If you have diabetes (diabetes mellitus) and CKD, it is important to keep your blood sugar (glucose) in the target range recommended by your health care provider. Follow your diabetes management plan. This may include: ? Checking your blood glucose regularly. ? Taking medicines by mouth, taking insulin, or taking both. ? Exercising for at least 30 minutes on 5 or more days each week, or as told by your health care provider. ? Tracking how many servings of carbohydrates you eat at each meal.  You may be given specific guidelines on how much of certain foods and nutrients you may eat, depending on your stage of kidney disease and whether you have high blood pressure (hypertension). Follow your meal plan as told by your dietitian. What nutrients should I limit? Work with your health care provider and dietitian to develop a meal plan that is right for you. Foods you can eat and foods you should limit or avoid will depend on the stage of your kidney disease and any other health conditions you have. The items listed below are not a complete list. Talk with your dietitian about what dietary choices are best for you. Potassium Potassium affects how steadily  your heart beats. If too much potassium builds up in your blood, the potassium can cause an irregular heartbeat or even a heart attack. You may need to limit or avoid foods that are high in potassium, such as:  Milk and soy milk.  Fruits, such as bananas, apricots, nectarines, melon, prunes, raisins, kiwi, and oranges.  Vegetables, such as potatoes, sweet potatoes, yams, tomatoes, leafy greens, beets, avocado,  pumpkin, and winter squash.  White and lima beans.  Whole-wheat breads and pastas.  Beans and nuts. Phosphorus Phosphorus is a mineral found in your bones. A balance between calcium and phosphorus is needed to build and maintain healthy bones. Too much phosphorus pulls calcium from your bones. This can make your bones weak and more likely to break. Too much phosphorus can also make your skin itch. You may need to limit or avoid foods that are high in phosphorus, such as:  Milk and dairy products.  Dried beans and peas.  Tofu, soy milk, and other soy-based meat replacements.  Dark-colored sodas.  Nuts and peanut butter.  Meat, poultry, and fish.  Bran cereals and oatmeal. Protein Protein helps you make and keep muscle. It also helps to repair your body's cells and tissues. One of the natural breakdown products of protein is a waste product called urea. When your kidneys are not working properly, they cannot remove wastes, such as urea. Reducing how much protein you eat can help prevent a buildup of urea in your blood. Depending on your stage of kidney disease, you may need to limit foods that are high in protein. Sources of animal protein include:  Meat (all types).  Fish and seafood.  Poultry.  Eggs.  Dairy. Other protein foods include:  Beans and legumes.  Nuts and nut butter.  Soy and tofu.   Sodium Sodium helps to maintain a healthy balance of fluids in your body. Too much sodium can increase your blood pressure and have a negative effect on your heart and lungs. Too much sodium can also cause your body to retain too much fluid, making your kidneys work harder. Most people should have less than 2,300 mg of sodium each day. If you have hypertension, you may need to limit your sodium to 1,500 mg each day. You may need to limit or avoid foods that are high in sodium, such as:  Salt seasonings.  Soy sauce.  Cured and processed meats.  Salted crackers and snack  foods.  Fast food.  Canned soups and most canned foods.  Pickled foods.  Vegetable juice.  Boxed mixes or ready-to-eat boxed meals and side dishes.  Bottled dressings, sauces, and marinades. Talk with your dietitian about how much potassium, phosphorus, protein, and sodium you may have each day. Summary  Chronic kidney disease (CKD) can lead to a buildup of waste and extra substances in the body. Certain foods lead to a buildup of these substances. By changing your diet as told, you can help prevent more kidney damage and delay or prevent the need for dialysis.  Food intake changes are different for each person with CKD. Work with a dietitian to set up nutrient goals and a meal plan that is right for you.  If you have diabetes and CKD, it is important to keep your blood sugar in the target range recommended by your health care provider. This information is not intended to replace advice given to you by your health care provider. Make sure you discuss any questions you have with your health care  provider. Document Revised: 09/27/2019 Document Reviewed: 09/27/2019 Elsevier Patient Education  2021 [employer].

## 2020-10-04 NOTE — Progress Notes (Signed)
GYNECOLOGY  VISIT   HPI: 56 y.o.   Married  Caucasian  female   G2P2 with No LMP recorded. Patient has had an ablation.   here for medication follow up.  She also needs lab follow up of her increased iron level and low WBC.  Feeling better since starting Effexor for situational stress and menopausal symptoms. HRT was causing side effects.  Taking medication in the am. No change in intestinal function.  Still not sleeping well.  New Rx for Vistaril from her PCP.  Melatonin caused a nightmare for her.  Was not able to see a dietician due to insurance.   Husband's cardiac surgery is next week. A friend of hers is coming to town to help.  GYNECOLOGIC HISTORY: No LMP recorded. Patient has had an ablation. Contraception:  Tubal/Ablation Menopausal hormone therapy:  none Last mammogram:  09-24-19 Neg/density C/Birads1 Last pap smear: 08-23-19 Neg:Neg HR HPV--no hx of abnormal paps        OB History    Gravida  2   Para  2   Term      Preterm      AB      Living  2     SAB      IAB      Ectopic      Multiple      Live Births                 Patient Active Problem List   Diagnosis Date Noted  . Postmenopausal 11/09/2019  . Acquired hypothyroidism 11/09/2019  . Weight loss 11/09/2019  . Hypothyroidism (acquired) 08/20/2019  . On hormone replacement therapy 08/20/2019  . Visit for preventive health examination 08/20/2019  . Encounter for screening mammogram for malignant neoplasm of breast 08/20/2019  . History of endometrial cancer 02/19/2018  . GERD (gastroesophageal reflux disease) 02/19/2018  . IBS (irritable bowel syndrome) 02/19/2018  . Diverticulosis 02/19/2018    Past Medical History:  Diagnosis Date  . Diverticulosis 02/19/2018   By colonoscopy 2016  . Fibroid   . GERD (gastroesophageal reflux disease)   . Hx of concussion   . IBS (irritable bowel syndrome)   . Iron excess 2022  . Kidney disease    Stage III--due to medication dexilant   . Leukopenia 2022  . Prediabetes   . Thyroid disease     Past Surgical History:  Procedure Laterality Date  . COLONOSCOPY  2016  . ENDOMETRIAL ABLATION    . SHOULDER SURGERY Left   . TUBAL LIGATION    . UPPER GASTROINTESTINAL ENDOSCOPY  11/10/2019    Current Outpatient Medications  Medication Sig Dispense Refill  . docusate sodium (COLACE) 100 MG capsule Take 100 mg by mouth 2 (two) times daily.    Marland Kitchen EPINEPHrine 0.3 mg/0.3 mL IJ SOAJ injection Inject 0.3 mg into the muscle as needed for anaphylaxis.    . famotidine (PEPCID) 20 MG tablet Take 20 mg by mouth 2 (two) times daily.    . hydrOXYzine (ATARAX/VISTARIL) 10 MG tablet Take 0.5-1 tablets (5-10 mg total) by mouth at bedtime as needed. 30 tablet 0  . levothyroxine (SYNTHROID) 75 MCG tablet Take 1 tablet (75 mcg total) by mouth daily. 90 tablet 3  . loratadine (CLARITIN) 10 MG tablet Take 10 mg by mouth daily.    . polyethylene glycol (MIRALAX) 17 g packet Take 17 g by mouth every other day. 14 each 0  . sucralfate (CARAFATE) 1 g tablet Take 1 tablet (1 g  total) by mouth every 6 (six) hours as needed. Slowy dissolve 1 tablet in 1 Tablespoon of distilled water to make a slurry before ingesting 90 tablet 2  . venlafaxine XR (EFFEXOR XR) 37.5 MG 24 hr capsule Take 1 capsule (37.5 mg total) by mouth daily. 30 capsule 2  . vitamin B-12 (CYANOCOBALAMIN) 1000 MCG tablet Take 1,000 mcg by mouth daily.    . vitamin k 100 MCG tablet Take 100 mcg by mouth daily.    . Multiple Vitamins-Minerals (MULTIVITAMIN ADULTS 50+ PO) Take 1 tablet by mouth daily. (Patient not taking: Reported on 10/04/2020)     Current Facility-Administered Medications  Medication Dose Route Frequency Provider Last Rate Last Admin  . 0.9 %  sodium chloride infusion  500 mL Intravenous Once Armbruster, Carlota Raspberry, MD      . dextrose 5 % solution   Intravenous Continuous Armbruster, Carlota Raspberry, MD         ALLERGIES: Flagyl [metronidazole], Iodine, Tramadol hcl, Gluten meal,  and Lactose intolerance (gi)  Family History  Problem Relation Age of Onset  . Testicular cancer Brother   . Esophageal cancer Brother   . Healthy Daughter   . Healthy Son   . Hypotension Maternal Grandmother   . Aortic dissection Maternal Grandmother   . Heart disease Maternal Grandfather   . Diabetes Paternal Grandmother        AODM  . Diabetes Paternal Grandfather        AODM  . Ovarian cancer Paternal Great-grandmother     Social History   Socioeconomic History  . Marital status: Married    Spouse name: Not on file  . Number of children: 2  . Years of education: Not on file  . Highest education level: Not on file  Occupational History  . Occupation: Neurosurgeon  Tobacco Use  . Smoking status: Never Smoker  . Smokeless tobacco: Never Used  Vaping Use  . Vaping Use: Never used  Substance and Sexual Activity  . Alcohol use: Yes    Alcohol/week: 2.0 standard drinks    Types: 2 Standard drinks or equivalent per week    Comment: occasionally  per week  . Drug use: Never  . Sexual activity: Yes    Birth control/protection: Surgical    Comment: Ablation  Other Topics Concern  . Not on file  Social History Narrative  . Not on file   Social Determinants of Health   Financial Resource Strain: Not on file  Food Insecurity: Not on file  Transportation Needs: Not on file  Physical Activity: Not on file  Stress: Not on file  Social Connections: Not on file  Intimate Partner Violence: Not on file    Review of Systems  All other systems reviewed and are negative.   PHYSICAL EXAMINATION:    BP 138/84   Pulse 78   Ht 5' 10.5" (1.791 m)   Wt 162 lb (73.5 kg)   SpO2 100%   BMI 22.92 kg/m     General appearance: alert, cooperative and appears stated age  ASSESSMENT  Situational stress.  Menopausal symptoms.  Medication monitoring.  Sleep disturbance. Increased iron level.  Low WBC. Chronic renal disease.  PLAN  Continue Effexor XR 37.5 mg daily.  Ok  to use Vistaril prn.  Consider counseling/life coach.  Brochure from [employer] counseling.  Check CBC with diff and iron level. Dietary information for chronic kidney disease. Fu for annual exam and prn.  24 min  total time was spent for this patient  encounter, including preparation, face-to-face counseling with the patient, coordination of care, and documentation of the encounter.

## 2020-10-05 LAB — CBC WITH DIFFERENTIAL/PLATELET
Absolute Monocytes: 845 cells/uL (ref 200–950)
Basophils Absolute: 72 cells/uL (ref 0–200)
Basophils Relative: 0.7 %
Eosinophils Absolute: 52 cells/uL (ref 15–500)
Eosinophils Relative: 0.5 %
HCT: 39.6 % (ref 35.0–45.0)
Hemoglobin: 13.3 g/dL (ref 11.7–15.5)
Lymphs Abs: 3080 cells/uL (ref 850–3900)
MCH: 32 pg (ref 27.0–33.0)
MCHC: 33.6 g/dL (ref 32.0–36.0)
MCV: 95.2 fL (ref 80.0–100.0)
MPV: 10.7 fL (ref 7.5–12.5)
Monocytes Relative: 8.2 %
Neutro Abs: 6252 cells/uL (ref 1500–7800)
Neutrophils Relative %: 60.7 %
Platelets: 247 10*3/uL (ref 140–400)
RBC: 4.16 10*6/uL (ref 3.80–5.10)
RDW: 11.9 % (ref 11.0–15.0)
Total Lymphocyte: 29.9 %
WBC: 10.3 10*3/uL (ref 3.8–10.8)

## 2020-10-05 LAB — IRON: Iron: 160 ug/dL (ref 45–160)

## 2020-10-11 ENCOUNTER — Ambulatory Visit: Payer: 59 | Admitting: Obstetrics and Gynecology

## 2020-10-20 ENCOUNTER — Encounter: Payer: Self-pay | Admitting: Obstetrics and Gynecology

## 2020-10-20 ENCOUNTER — Emergency Department (HOSPITAL_COMMUNITY): Payer: 59

## 2020-10-20 ENCOUNTER — Telehealth: Payer: Self-pay

## 2020-10-20 ENCOUNTER — Encounter: Payer: Self-pay | Admitting: Registered Nurse

## 2020-10-20 ENCOUNTER — Emergency Department (HOSPITAL_BASED_OUTPATIENT_CLINIC_OR_DEPARTMENT_OTHER): Payer: 59

## 2020-10-20 ENCOUNTER — Observation Stay (HOSPITAL_BASED_OUTPATIENT_CLINIC_OR_DEPARTMENT_OTHER)
Admission: EM | Admit: 2020-10-20 | Discharge: 2020-10-21 | Disposition: A | Discharge status: Home / Self Care | Payer: 59 | Attending: Family Medicine | Admitting: Family Medicine

## 2020-10-20 ENCOUNTER — Other Ambulatory Visit: Payer: Self-pay

## 2020-10-20 ENCOUNTER — Encounter (HOSPITAL_BASED_OUTPATIENT_CLINIC_OR_DEPARTMENT_OTHER): Payer: Self-pay | Admitting: Emergency Medicine

## 2020-10-20 DIAGNOSIS — G459 Transient cerebral ischemic attack, unspecified: Secondary | ICD-10-CM

## 2020-10-20 DIAGNOSIS — E039 Hypothyroidism, unspecified: Secondary | ICD-10-CM | POA: Insufficient documentation

## 2020-10-20 DIAGNOSIS — Y900 Blood alcohol level of less than 20 mg/100 ml: Secondary | ICD-10-CM | POA: Diagnosis not present

## 2020-10-20 DIAGNOSIS — R531 Weakness: Secondary | ICD-10-CM | POA: Diagnosis present

## 2020-10-20 DIAGNOSIS — R29898 Other symptoms and signs involving the musculoskeletal system: Secondary | ICD-10-CM

## 2020-10-20 DIAGNOSIS — N183 Chronic kidney disease, stage 3 unspecified: Secondary | ICD-10-CM | POA: Insufficient documentation

## 2020-10-20 DIAGNOSIS — G43101 Migraine with aura, not intractable, with status migrainosus: Secondary | ICD-10-CM | POA: Diagnosis not present

## 2020-10-20 DIAGNOSIS — Z20822 Contact with and (suspected) exposure to covid-19: Secondary | ICD-10-CM | POA: Insufficient documentation

## 2020-10-20 DIAGNOSIS — Z79899 Other long term (current) drug therapy: Secondary | ICD-10-CM | POA: Diagnosis not present

## 2020-10-20 DIAGNOSIS — Z8542 Personal history of malignant neoplasm of other parts of uterus: Secondary | ICD-10-CM | POA: Diagnosis not present

## 2020-10-20 HISTORY — DX: Transient cerebral ischemic attack, unspecified: G45.9

## 2020-10-20 LAB — DIFFERENTIAL
Abs Immature Granulocytes: 0 10*3/uL (ref 0.00–0.07)
Basophils Absolute: 0.1 10*3/uL (ref 0.0–0.1)
Basophils Relative: 1 %
Eosinophils Absolute: 0.1 10*3/uL (ref 0.0–0.5)
Eosinophils Relative: 2 %
Immature Granulocytes: 0 %
Lymphocytes Relative: 46 %
Lymphs Abs: 2.6 10*3/uL (ref 0.7–4.0)
Monocytes Absolute: 0.4 10*3/uL (ref 0.1–1.0)
Monocytes Relative: 8 %
Neutro Abs: 2.5 10*3/uL (ref 1.7–7.7)
Neutrophils Relative %: 43 %

## 2020-10-20 LAB — COMPREHENSIVE METABOLIC PANEL
ALT: 29 U/L (ref 0–44)
AST: 31 U/L (ref 15–41)
Albumin: 4.5 g/dL (ref 3.5–5.0)
Alkaline Phosphatase: 64 U/L (ref 38–126)
Anion gap: 10 (ref 5–15)
BUN: 15 mg/dL (ref 6–20)
CO2: 28 mmol/L (ref 22–32)
Calcium: 10.1 mg/dL (ref 8.9–10.3)
Chloride: 103 mmol/L (ref 98–111)
Creatinine, Ser: 1.2 mg/dL — ABNORMAL HIGH (ref 0.44–1.00)
GFR, Estimated: 53 mL/min — ABNORMAL LOW (ref >60)
Glucose, Bld: 90 mg/dL (ref 70–99)
Potassium: 3.9 mmol/L (ref 3.5–5.1)
Sodium: 141 mmol/L (ref 135–145)
Total Bilirubin: 0.4 mg/dL (ref 0.3–1.2)
Total Protein: 7 g/dL (ref 6.5–8.1)

## 2020-10-20 LAB — CBG MONITORING, ED
Glucose-Capillary: 84 mg/dL (ref 70–99)
Glucose-Capillary: 79 mg/dL (ref 70–99)

## 2020-10-20 LAB — CBC
HCT: 39.2 % (ref 36.0–46.0)
Hemoglobin: 13.1 g/dL (ref 12.0–15.0)
MCH: 31.6 pg (ref 26.0–34.0)
MCHC: 33.4 g/dL (ref 30.0–36.0)
MCV: 94.5 fL (ref 80.0–100.0)
Platelets: 194 10*3/uL (ref 150–400)
RBC: 4.15 MIL/uL (ref 3.87–5.11)
RDW: 11.9 % (ref 11.5–15.5)
WBC: 5.7 10*3/uL (ref 4.0–10.5)
nRBC: 0 % (ref 0.0–0.2)

## 2020-10-20 LAB — ETHANOL: Alcohol, Ethyl (B): <10 mg/dL (ref <10)

## 2020-10-20 LAB — RESP PANEL BY RT-PCR (FLU A&B, COVID) ARPGX2
Influenza A by PCR: NEGATIVE
Influenza B by PCR: NEGATIVE
SARS Coronavirus 2 by RT PCR: NEGATIVE

## 2020-10-20 LAB — PROTIME-INR
INR: 0.9 (ref 0.8–1.2)
Prothrombin Time: 11.9 seconds (ref 11.4–15.2)

## 2020-10-20 LAB — APTT: aPTT: 33 seconds (ref 24–36)

## 2020-10-20 MED ORDER — FAMOTIDINE 20 MG PO TABS
20.0000 mg | ORAL_TABLET | Freq: Two times a day (BID) | ORAL | Status: DC
  Administered 2020-10-21: 20 mg via ORAL
  Filled 2020-10-20: qty 1

## 2020-10-20 MED ORDER — LEVOTHYROXINE SODIUM 75 MCG PO TABS
75.0000 ug | ORAL_TABLET | Freq: Every day | ORAL | Status: DC
  Administered 2020-10-21: 75 ug via ORAL
  Filled 2020-10-20: qty 1

## 2020-10-20 MED ORDER — HYDRALAZINE HCL 25 MG PO TABS: 25.0000 mg | ORAL_TABLET | Freq: Four times a day (QID) | ORAL | Status: DC | PRN

## 2020-10-20 MED ORDER — LORATADINE 10 MG PO TABS
10.0000 mg | ORAL_TABLET | Freq: Every day | ORAL | Status: DC
  Administered 2020-10-21: 10 mg via ORAL
  Filled 2020-10-20: qty 1

## 2020-10-20 MED ORDER — DOCUSATE SODIUM 100 MG PO CAPS
100.0000 mg | ORAL_CAPSULE | Freq: Two times a day (BID) | ORAL | Status: DC
  Administered 2020-10-21: 100 mg via ORAL
  Filled 2020-10-20: qty 1

## 2020-10-20 MED ORDER — ACETAMINOPHEN 325 MG PO TABS
650.0000 mg | ORAL_TABLET | ORAL | Status: DC | PRN
  Administered 2020-10-20 – 2020-10-21 (×2): 650 mg via ORAL
  Filled 2020-10-20 (×2): qty 2

## 2020-10-20 MED ORDER — ENOXAPARIN SODIUM 40 MG/0.4ML IJ SOSY
40.0000 mg | PREFILLED_SYRINGE | INTRAMUSCULAR | Status: DC
  Administered 2020-10-20: 40 mg via SUBCUTANEOUS
  Filled 2020-10-20: qty 0.4

## 2020-10-20 MED ORDER — ACETAMINOPHEN 650 MG RE SUPP: 650.0000 mg | RECTAL | Status: DC | PRN

## 2020-10-20 MED ORDER — STROKE: EARLY STAGES OF RECOVERY BOOK: Freq: Once | Status: DC

## 2020-10-20 MED ORDER — SODIUM CHLORIDE 0.9 % IV SOLN
12.5000 mg | Freq: Once | INTRAVENOUS | Status: AC
  Administered 2020-10-20: 12.5 mg via INTRAVENOUS
  Filled 2020-10-20: qty 0.5

## 2020-10-20 MED ORDER — POLYETHYLENE GLYCOL 3350 17 G PO PACK
17.0000 g | PACK | ORAL | Status: DC
  Filled 2020-10-20: qty 1

## 2020-10-20 MED ORDER — VENLAFAXINE HCL ER 37.5 MG PO CP24
37.5000 mg | ORAL_CAPSULE | Freq: Every day | ORAL | Status: DC
  Administered 2020-10-21: 37.5 mg via ORAL
  Filled 2020-10-20: qty 1

## 2020-10-20 MED ORDER — ACETAMINOPHEN 160 MG/5ML PO SOLN: 650.0000 mg | ORAL | Status: DC | PRN

## 2020-10-20 MED ORDER — ASPIRIN EC 81 MG PO TBEC
81.0000 mg | DELAYED_RELEASE_TABLET | Freq: Every day | ORAL | Status: DC
  Administered 2020-10-20 – 2020-10-21 (×2): 81 mg via ORAL
  Filled 2020-10-20 (×2): qty 1

## 2020-10-20 MED ORDER — VITAMIN K 100 MCG PO TABS: 100.0000 ug | ORAL_TABLET | Freq: Every day | ORAL | Status: DC

## 2020-10-20 NOTE — H&P (Signed)
History and Physical    Candace Allen SHF:[id number] DOB: Dec 06, 1965 DOA: 10/20/2020  PCP: Maximiano Coss, NP (Confirm with patient/family/NH records and if not entered, this has to be entered at [facility] point of entry) Patient coming from: Home  I have personally briefly reviewed patient's old medical records in [place]  Chief Complaint: Left arm numbness and weakness  HPI: Candace Allen is a 55 y.o. female with medical history significant of migraine headache, anxiety/depression, hypothyroidism, presented with new onset of TIA-like symptoms.  Patient has been under "great distress in life" starting about 2 weeks ago, since then she has been having days aching-like headache, she been taking OTC medication with some improvement.  However this morning, suddenly she started to feel left arm and 5 fingers numbness and "heavy to raise" left arm, whole episode lasted about 3 to 5 minutes and resolved by itself.  Denied blurred vision, speech problem, numbness or weakness of any other limbs. ED Course: Blood pressure significantly elevated especially diastolic, stroke work-up including MRI MRI negative for stroke or significant stenosis.  Review of Systems: As per HPI otherwise 14 point review of systems negative.    Past Medical History:  Diagnosis Date  . Diverticulosis 02/19/2018   By colonoscopy 2016  . Fibroid   . GERD (gastroesophageal reflux disease)   . Hx of concussion   . IBS (irritable bowel syndrome)   . Iron excess 2022  . Kidney disease    Stage III--due to medication dexilant  . Leukopenia 2022  . Prediabetes   . Thyroid disease     Past Surgical History:  Procedure Laterality Date  . COLONOSCOPY  2016  . ENDOMETRIAL ABLATION    . SHOULDER SURGERY Left   . TUBAL LIGATION    . UPPER GASTROINTESTINAL ENDOSCOPY  11/10/2019     reports that she has never smoked. She has never used smokeless tobacco. She reports current alcohol use of about 2.0 standard drinks of  alcohol per week. She reports that she does not use drugs.  Allergies  Allergen Reactions  . Flagyl [Metronidazole] Anaphylaxis  . Iodine Anaphylaxis  . Tramadol Hcl Other (See Comments)    Showed signs of stroke  . Gluten Meal   . Lactose Intolerance (Gi)     Family History  Problem Relation Age of Onset  . Testicular cancer Brother   . Esophageal cancer Brother   . Healthy Daughter   . Healthy Son   . Hypotension Maternal Grandmother   . Aortic dissection Maternal Grandmother   . Heart disease Maternal Grandfather   . Diabetes Paternal Grandmother        AODM  . Diabetes Paternal Grandfather        AODM  . Ovarian cancer Paternal Great-grandmother      Prior to Admission medications   Medication Sig Start Date End Date Taking? Authorizing Provider  docusate sodium (COLACE) 100 MG capsule Take 100 mg by mouth 2 (two) times daily.   Yes [provider]  famotidine (PEPCID) 20 MG tablet Take 20 mg by mouth 2 (two) times daily.   Yes [provider]  hydrOXYzine (ATARAX/VISTARIL) 10 MG tablet Take 0.5-1 tablets (5-10 mg total) by mouth at bedtime as needed. 10/03/20  Yes Maximiano Coss, NP  levothyroxine (SYNTHROID) 75 MCG tablet Take 1 tablet (75 mcg total) by mouth daily. 05/25/20  Yes Shamleffer, Melanie Crazier, MD  loratadine (CLARITIN) 10 MG tablet Take 10 mg by mouth daily.   Yes [provider]  polyethylene glycol (MIRALAX) 17 g packet Take 17 g by mouth every other day. 11/02/19  Yes Armbruster, Carlota Raspberry, MD  sucralfate (CARAFATE) 1 g tablet Take 1 tablet (1 g total) by mouth every 6 (six) hours as needed. Slowy dissolve 1 tablet in 1 Tablespoon of distilled water to make a slurry before ingesting 11/02/19  Yes Armbruster, Carlota Raspberry, MD  venlafaxine XR (EFFEXOR XR) 37.5 MG 24 hr capsule Take 1 capsule (37.5 mg total) by mouth daily. 10/04/20  Yes Nunzio Cobbs, MD  vitamin B-12 (CYANOCOBALAMIN) 1000 MCG tablet Take 1,000 mcg by mouth  daily.   Yes [provider]  vitamin k 100 MCG tablet Take 100 mcg by mouth daily.   Yes [provider]  EPINEPHrine 0.3 mg/0.3 mL IJ SOAJ injection Inject 0.3 mg into the muscle as needed for anaphylaxis.    [provider]  Multiple Vitamins-Minerals (MULTIVITAMIN ADULTS 50+ PO) Take 1 tablet by mouth daily. Patient not taking: No sig reported    [provider]    Physical Exam: Vitals:   10/20/20 1400 10/20/20 1500 10/20/20 1515 10/20/20 1530  BP: (!) 164/99 (!) 173/96 (!) 149/94 (!) 147/96  Pulse: 63 70 61 77  Resp: 19 16 15  (!) 21  Temp:      TempSrc:      SpO2: 100% 100% 100% 100%  Weight:      Height:        Constitutional: NAD, calm, comfortable Vitals:   10/20/20 1400 10/20/20 1500 10/20/20 1515 10/20/20 1530  BP: (!) 164/99 (!) 173/96 (!) 149/94 (!) 147/96  Pulse: 63 70 61 77  Resp: 19 16 15  (!) 21  Temp:      TempSrc:      SpO2: 100% 100% 100% 100%  Weight:      Height:       Eyes: PERRL, lids and conjunctivae normal ENMT: Mucous membranes are moist. Posterior pharynx clear of any exudate or lesions.Normal dentition.  Neck: normal, supple, no masses, no thyromegaly Respiratory: clear to auscultation bilaterally, no wheezing, no crackles. Normal respiratory effort. No accessory muscle use.  Cardiovascular: Regular rate and rhythm, no murmurs / rubs / gallops. No extremity edema. 2+ pedal pulses. No carotid bruits.  Abdomen: no tenderness, no masses palpated. No hepatosplenomegaly. Bowel sounds positive.  Musculoskeletal: no clubbing / cyanosis. No joint deformity upper and lower extremities. Good ROM, no contractures. Normal muscle tone.  Skin: no rashes, lesions, ulcers. No induration Neurologic: CN 2-12 grossly intact. Sensation intact, DTR normal. Strength 5/5 in all 4.  Psychiatric: Normal judgment and insight. Alert and oriented x 3. Normal mood.     Labs on Admission: I have personally reviewed following labs and  imaging studies  CBC: Recent Labs  Lab 10/20/20 1324  WBC 5.7  NEUTROABS 2.5  HGB 13.1  HCT 39.2  MCV 94.5  PLT 573   Basic Metabolic Panel: Recent Labs  Lab 10/20/20 1324  NA 141  K 3.9  CL 103  CO2 28  GLUCOSE 90  BUN 15  CREATININE 1.20*  CALCIUM 10.1   GFR: Estimated Creatinine Clearance: 58.5 mL/min (A) (by C-G formula based on SCr of 1.2 mg/dL (H)). Liver Function Tests: Recent Labs  Lab 10/20/20 1324  AST 31  ALT 29  ALKPHOS 64  BILITOT 0.4  PROT 7.0  ALBUMIN 4.5   No results for input(s): LIPASE, AMYLASE in the last 168 hours. No results for input(s): AMMONIA in the last  168 hours. Coagulation Profile: Recent Labs  Lab 10/20/20 1324  INR 0.9   Cardiac Enzymes: No results for input(s): CKTOTAL, CKMB, CKMBINDEX, TROPONINI in the last 168 hours. BNP (last 3 results) No results for input(s): PROBNP in the last 8760 hours. HbA1C: No results for input(s): HGBA1C in the last 72 hours. CBG: Recent Labs  Lab 10/20/20 1304 10/20/20 1427  GLUCAP 84 79   Lipid Profile: No results for input(s): CHOL, HDL, LDLCALC, TRIG, CHOLHDL, LDLDIRECT in the last 72 hours. Thyroid Function Tests: No results for input(s): TSH, T4TOTAL, FREET4, T3FREE, THYROIDAB in the last 72 hours. Anemia Panel: No results for input(s): VITAMINB12, FOLATE, FERRITIN, TIBC, IRON, RETICCTPCT in the last 72 hours. Urine analysis: No results found for: COLORURINE, APPEARANCEUR, LABSPEC, PHURINE, GLUCOSEU, HGBUR, BILIRUBINUR, KETONESUR, PROTEINUR, UROBILINOGEN, NITRITE, LEUKOCYTESUR  Radiological Exams on Admission: MR ANGIO HEAD WO CONTRAST  Result Date: 10/20/2020 CLINICAL DATA:  Neuro deficit.  Left-sided weakness. EXAM: MRI HEAD WITHOUT CONTRAST MRA HEAD WITHOUT CONTRAST TECHNIQUE: Multiplanar, multiecho pulse sequences of the brain and surrounding structures were obtained without intravenous contrast. Angiographic images of the head were obtained using MRA technique without  contrast. COMPARISON:  None. CT head without contrast 10/20/2020.  MR head 02/20/2018 FINDINGS: MRA HEAD FINDINGS Brain: The diffusion-weighted images demonstrate no acute or subacute infarction. No significant white matter disease is present. Limited protocol with diffusion-weighted imaging was performed. No definite parenchymal lesions are present. MRA HEAD FINDINGS Internal carotid arteries are within normal limits from the high cervical segments through the ICA termini. The A1 and M1 segments are normal. Anterior communicating artery is patent. MCA bifurcations are intact. The ACA and MCA branch vessels are normal. The right vertebral artery is dominant. Right PICA origin is visualized and normal. Left AICA is dominant. The posterior cerebral arteries originate from the basilar tip. Prominent posterior communicating artery contributes on the right. PCA branch vessels are within normal limits. IMPRESSION: 1. Limited protocol MRI of the brain without evidence for acute or subacute infarction. 2. Normal MRA circle-of-Willis without evidence for significant proximal stenosis, aneurysm, or branch vessel occlusion. Electronically Signed   By: San Morelle M.D.   On: 10/20/2020 15:09   MR BRAIN WO CONTRAST  Result Date: 10/20/2020 CLINICAL DATA:  Neuro deficit.  Left-sided weakness. EXAM: MRI HEAD WITHOUT CONTRAST MRA HEAD WITHOUT CONTRAST TECHNIQUE: Multiplanar, multiecho pulse sequences of the brain and surrounding structures were obtained without intravenous contrast. Angiographic images of the head were obtained using MRA technique without contrast. COMPARISON:  None. CT head without contrast 10/20/2020.  MR head 02/20/2018 FINDINGS: MRA HEAD FINDINGS Brain: The diffusion-weighted images demonstrate no acute or subacute infarction. No significant white matter disease is present. Limited protocol with diffusion-weighted imaging was performed. No definite parenchymal lesions are present. MRA HEAD FINDINGS  Internal carotid arteries are within normal limits from the high cervical segments through the ICA termini. The A1 and M1 segments are normal. Anterior communicating artery is patent. MCA bifurcations are intact. The ACA and MCA branch vessels are normal. The right vertebral artery is dominant. Right PICA origin is visualized and normal. Left AICA is dominant. The posterior cerebral arteries originate from the basilar tip. Prominent posterior communicating artery contributes on the right. PCA branch vessels are within normal limits. IMPRESSION: 1. Limited protocol MRI of the brain without evidence for acute or subacute infarction. 2. Normal MRA circle-of-Willis without evidence for significant proximal stenosis, aneurysm, or branch vessel occlusion. Electronically Signed   By: Wynetta Fines.D.  On: 10/20/2020 15:09   CT HEAD CODE STROKE WO CONTRAST  Result Date: 10/20/2020 CLINICAL DATA:  Code stroke.  Left-sided weakness, headache EXAM: CT HEAD WITHOUT CONTRAST TECHNIQUE: Contiguous axial images were obtained from the base of the skull through the vertex without intravenous contrast. COMPARISON:  2019 FINDINGS: Brain: No acute intracranial hemorrhage, mass effect, or edema. Ventricles and sulci are normal in size and configuration. No extra-axial collection. Gray-white differentiation is preserved. Vascular: No hyperdense vessel. Skull: Unremarkable. Sinuses/Orbits: No acute process. Other: Mastoid air cells are clear. ASPECTS ([place] Stroke Program Early CT Score) - Ganglionic level infarction (caudate, lentiform nuclei, internal capsule, insula, M1-M3 cortex): 7 - Supraganglionic infarction (M4-M6 cortex): 3 Total score (0-10 with 10 being normal): 10 IMPRESSION: There is no acute intracranial hemorrhage or evidence of acute infarction. ASPECT score is 10. These results were called by telephone at the time of interpretation on 10/20/2020 at 1:37 pm to provider [facility] , who verbally  acknowledged these results. Electronically Signed   By: Macy Mis M.D.   On: 10/20/2020 13:38    EKG: Independently reviewed. Sinus, borderline LVH  Assessment/Plan Active Problems:   TIA (transient ischemic attack)  (please populate well all problems here in Problem List. (For example, if patient is on BP meds at home and you resume or decide to hold them, it is a problem that needs to be her. Same for CAD, COPD, HLD and so on)  TIA -ABCD2=3, with 90 days stroke risk 3.1% -ASA -Lipid panel and start statin on discharge. -Echocardiogram.  Question of complex migraine -As needed Tylenol.  Elevated blood pressure without diagnosis of HTN -Allow permissive hypertension for now -As needed hydralazine for 200/100  Hypothyroidism -Continue Synthroid  DVT prophylaxis: Lovenox Code Status: Full code Family Communication: Husband at bedside Disposition Plan: Less than 24 hours hospital stay Consults called: Neurology Admission status: Telemetry observation   Lequita Halt MD [facility] Pager [phone]  10/20/2020, 4:16 PM

## 2020-10-20 NOTE — ED Notes (Signed)
Attempted to call report x 4.

## 2020-10-20 NOTE — Consult Note (Signed)
[facility] Telemedicine Consult   Requesting Physician: Aletta Edouard  Consult Participants: [facility] nurse, bedside nurse, Dr. Melina Copa, patient, patient's spouse Location of the provider: [facility] Location of the patient: [facility]  This consult was provided via telemedicine with 2-way video and audio communication. The patient/family was informed that care would be provided in this way and agreed to receive care in this manner.   CC: Left arm tingling / heaviness   History is obtained from: Patient and husband at bedside   HPI: Candace Allen is a 55 y.o. female with no known stroke risk factors other than migraine headaches, presenting with acute onset left arm numbness and tingling that started at noon while she was on the phone.  Initially started in her fingers and then spread up towards her face over a few minutes.  There is also associated heaviness of the left upper extremity and difficulty moving it.  Symptoms have somewhat been waxing and waning.  She reports a similar event when she took tramadol many years ago, which was attributed to an allergic reaction to tramadol.  At that time she had difficulty moving and felt "like I was having a stroke"  Regarding her migraines, she reports she used to have quite severe migraines but has not had one in many years.  She does describe recurrent headache as throbbing/pulsating in quality, from the base of her neck wrapping around her face, but denies any neck pain or neck trauma recently  She notes the only medical problem she is currently being worked up for is increased iron levels   LKW: noon  tPA given?: No, patient elected to pursue emergent MRI given potential for complex migraine Premorbid modified rankin scale:      0 - No symptoms. Time of teleneurologist evaluation: 13:40   ROS: All other review of systems was negative except as noted in the HPI.   Past Medical History:  Diagnosis Date  .  Diverticulosis 02/19/2018   By colonoscopy 2016  . Fibroid   . GERD (gastroesophageal reflux disease)   . Hx of concussion   . IBS (irritable bowel syndrome)   . Iron excess 2022  . Kidney disease    Stage III--due to medication dexilant  . Leukopenia 2022  . Prediabetes   . Thyroid disease    Past Surgical History:  Procedure Laterality Date  . COLONOSCOPY  2016  . ENDOMETRIAL ABLATION    . SHOULDER SURGERY Left   . TUBAL LIGATION    . UPPER GASTROINTESTINAL ENDOSCOPY  11/10/2019   Current Outpatient Medications  Medication Instructions  . docusate sodium (COLACE) 100 mg, Oral, 2 times daily  . EPINEPHrine (EPI-PEN) 0.3 mg, Intramuscular, As needed  . famotidine (PEPCID) 20 mg, Oral, 2 times daily  . hydrOXYzine (ATARAX/VISTARIL) 5-10 mg, Oral, At bedtime PRN  . levothyroxine (SYNTHROID) 75 mcg, Oral, Daily  . loratadine (CLARITIN) 10 mg, Oral, Daily  . Multiple Vitamins-Minerals (MULTIVITAMIN ADULTS 50+ PO) 1 tablet, Daily  . polyethylene glycol (MIRALAX) 17 g, Oral, Every other day  . sucralfate (CARAFATE) 1 g, Oral, Every 6 hours PRN, Slowy dissolve 1 tablet in 1 Tablespoon of distilled water to make a slurry before ingesting  . venlafaxine XR (EFFEXOR XR) 37.5 mg, Oral, Daily  . vitamin B-12 (CYANOCOBALAMIN) 1,000 mcg, Oral, Daily  . vitamin k 100 mcg, Oral, Daily   Family History  Problem Relation Age of Onset  . Testicular cancer Brother   . Esophageal cancer Brother   .  Healthy Daughter   . Healthy Son   . Hypotension Maternal Grandmother   . Aortic dissection Maternal Grandmother   . Heart disease Maternal Grandfather   . Diabetes Paternal Grandmother        AODM  . Diabetes Paternal Grandfather        AODM  . Ovarian cancer Paternal Great-grandmother   Negative for history of stroke   Social History:  reports that she has never smoked. She has never used smokeless tobacco. She reports current alcohol use of about 2.0 standard drinks of alcohol per week. She  reports that she does not use drugs.  Exam: Vitals:   10/20/20 1255 10/20/20 1346  BP: (!) 171/100 (!) 168/97  Pulse: 63 (!) 54  Resp: 20 (!) 9  Temp: 97.8 F (36.6 C)   SpO2: 100% 100%    General: Anxious affect, mildly tachypneic, otherwise no acute distress Pulmonary: breathing comfortably Cardiac: regular rate and rhythm on monitor   NIH Stroke scale 1A: Level of Consciousness - 0 1B: Ask Month and Age - 0 1C: 'Blink Eyes' & 'Squeeze Hands' - 0 2: Test Horizontal Extraocular Movements - 0 3: Test Visual Fields - 0 4: Test Facial Palsy - 0 5A: Test Left Arm Motor Drift - 0 5B: Test Right Arm Motor Drift - 0 6A: Test Left Leg Motor Drift - 0 6B: Test Right Leg Motor Drift - 0 7: Test Limb Ataxia - 0 8: Test Sensation - 1 9: Test Language/Aphasia- 0 10: Test Dysarthria - 0 11: Test Extinction/Inattention - 0 NIHSS score: 1   Imaging Reviewed:  Personally reviewed head CT, no acute intracranial process, aspects 10  Labs reviewed in epic and pertinent values follow: Glucose 84, platelets 194, hemoglobin 13.1 and stable from baseline    Assessment: Symptoms and history most consistent with complex migraine, but cannot rule out a right thalamic stroke.  While symptoms are relatively minor, she may have a stuttering lacunar stroke and did have some weakness of the left upper extremity at some point which could worsen.  Additionally post thalamic stroke recovery can be complicated by development of severe thalamic pain syndromes, therefore tPA should be considered if this is a stroke.  tPA contraindications were reviewed with the patient and her husband, who noted that she has no known contraindications to tPA.    Given her lack of stroke risk factors, and the fact that this would be a small thalamic stroke with a relatively small risk of hemorrhage given the small size, and the fact that she has a strong history of migraine headaches which makes complicated migraine more  likely, as well as the historical features of her symptoms which are more suggestive of migraine than stroke given gradual progression of symptoms over a few minutes, patient and husband felt that transfer for emergent MRI.  She will remain within the tPA window until 4:30 PM  Recommendations:  -Emergent transfer to [facility] for MRI -If MRI is DWI positive, patient is interested in tPA and has been screened and cleared -Plan communicated to NP Madelon Lips via secure chat as well as by phone, with Dr. Cheral Marker included in secure chat messages.   CRITICAL CARE Performed by: Lorenza Chick   Total critical care time: 43 minutes  Critical care time was exclusive of separately billable procedures and treating other patients.  Critical care was necessary to treat or prevent imminent or life-threatening deterioration. This patient is receiving care for possible acute neurological changes,  including discussion of using a life-threatening medication, tPA.   Critical care was time spent personally by me on the following activities: development of treatment plan with patient and/or surrogate as well as nursing, discussions with consultants, evaluation of patient's response to treatment, examination of patient, obtaining history from patient or surrogate, ordering and performing treatments and interventions, ordering and review of laboratory studies, ordering and review of radiographic studies, pulse oximetry and re-evaluation of patient's condition.    Lesleigh Noe MD-PhD [facility] [phone] If 8pm-8am, please page neurology on call as listed in [place].

## 2020-10-20 NOTE — Consult Note (Signed)
Neurology Consultation  Reason for Consult: Acute onset of left face and arm numbness  Referring Physician: Dr. Roslynn Amble  CC: Left face and upper extremity numbness  History is obtained from: Patient, Chart Review  HPI: Candace Allen is a 55 y.o. female with a medical history significant for migraine headaches with vision loss > 30 years ago and prediabetes who presented to [facility] for evaluation of acute onset of left arm numbness, weakness, and tingling that started at 12:00 today in her fingers and gradually spread up her arm into her left cheek over approximately one minute while she was talking on the telephone. She woke up this morning feeling dizzy and nauseated but without a headache. She states that she has had a throbbing headache for approximately 2 weeks that starts at the base of her head and wraps around to her forehead with periorbital involvement bilaterally. It has been fluctuating between a 3/10 to a 10/10 in severity with associated phonophobia, photophobia, and nausea. On presentation at the OSH her symptoms varied between left arm weakness, tingling, and complete left upper extremity numbness. A Code Stroke was activated with CT imaging without acute abnormality. 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 For safety reasons due to the unknown status of a possible allergy to iodinated contrast, she was unable to undergo CT angiography imaging. She was transferred to [facility] for MRI/MRA brain imaging to further evaluate for acute stroke.  Of note, patient endorses increased stress recently due to her husband being in the hospital with a recent cardiac procedure. She also noted that when her numbness started today, she took her blood pressure and it was  extremely elevated from her baseline blood pressure. She states her systolic blood pressure today was 156 but that it is usually no higher than 116. In the ED at [facility], patient's blood pressure remains elevated at 173/96.  LKW: 12:00 tpa given?: no, MRI imaging without evidence of acute infarct, presentation most consistent with complex migraine / status migrainosus  IR Thrombectomy? No, presentation not consistent with LVO. MRA negative for LVO.  Modified Rankin Scale: 0-Completely asymptomatic and back to baseline post- stroke  ROS: A complete ROS was performed and is negative except as noted in the HPI.   Past Medical History:  Diagnosis Date  . Diverticulosis 02/19/2018   By colonoscopy 2016  . Fibroid   . GERD (gastroesophageal reflux disease)   . Hx of concussion   . IBS (irritable bowel syndrome)   . Iron excess 2022  . Kidney disease    Stage III--due to medication dexilant  . Leukopenia 2022  . Prediabetes   . Thyroid disease    Past Surgical History:  Procedure Laterality Date  . COLONOSCOPY  2016  . ENDOMETRIAL ABLATION    . SHOULDER SURGERY Left   . TUBAL LIGATION    . UPPER GASTROINTESTINAL ENDOSCOPY  11/10/2019   Family History  Problem Relation Age of Onset  . Testicular cancer Brother   . Esophageal cancer Brother   . Healthy Daughter   . Healthy Son   . Hypotension Maternal Grandmother   . Aortic dissection Maternal Grandmother   . Heart disease Maternal Grandfather   . Diabetes Paternal Grandmother        AODM  . Diabetes Paternal  Grandfather        AODM  . Ovarian cancer Paternal Great-grandmother    Social History:   reports that she has never smoked. She has never used smokeless tobacco. She reports current alcohol use of about 2.0 standard drinks of alcohol per week. She reports that she does not use drugs.  Medications  Current Facility-Administered Medications:  .  0.9 %  sodium chloride infusion, 500 mL, Intravenous, Once, Armbruster,  Carlota Raspberry, MD .  dextrose 5 % solution, , Intravenous, Continuous, Armbruster, Carlota Raspberry, MD  Current Outpatient Medications:  .  docusate sodium (COLACE) 100 MG capsule, Take 100 mg by mouth 2 (two) times daily., Disp: , Rfl:  .  famotidine (PEPCID) 20 MG tablet, Take 20 mg by mouth 2 (two) times daily., Disp: , Rfl:  .  hydrOXYzine (ATARAX/VISTARIL) 10 MG tablet, Take 0.5-1 tablets (5-10 mg total) by mouth at bedtime as needed., Disp: 30 tablet, Rfl: 0 .  levothyroxine (SYNTHROID) 75 MCG tablet, Take 1 tablet (75 mcg total) by mouth daily., Disp: 90 tablet, Rfl: 3 .  loratadine (CLARITIN) 10 MG tablet, Take 10 mg by mouth daily., Disp: , Rfl:  .  polyethylene glycol (MIRALAX) 17 g packet, Take 17 g by mouth every other day., Disp: 14 each, Rfl: 0 .  sucralfate (CARAFATE) 1 g tablet, Take 1 tablet (1 g total) by mouth every 6 (six) hours as needed. Slowy dissolve 1 tablet in 1 Tablespoon of distilled water to make a slurry before ingesting, Disp: 90 tablet, Rfl: 2 .  venlafaxine XR (EFFEXOR XR) 37.5 MG 24 hr capsule, Take 1 capsule (37.5 mg total) by mouth daily., Disp: 90 capsule, Rfl: 1 .  vitamin B-12 (CYANOCOBALAMIN) 1000 MCG tablet, Take 1,000 mcg by mouth daily., Disp: , Rfl:  .  vitamin k 100 MCG tablet, Take 100 mcg by mouth daily., Disp: , Rfl:  .  EPINEPHrine 0.3 mg/0.3 mL IJ SOAJ injection, Inject 0.3 mg into the muscle as needed for anaphylaxis., Disp: , Rfl:  .  Multiple Vitamins-Minerals (MULTIVITAMIN ADULTS 50+ PO), Take 1 tablet by mouth daily. (Patient not taking: No sig reported), Disp: , Rfl:   Exam: Current vital signs: BP (!) 168/97   Pulse (!) 54   Temp 97.8 F (36.6 C) (Oral)   Resp (!) 9   Ht 5' 11"  (1.803 m)   Wt 72.6 kg   SpO2 100%   BMI 22.32 kg/m  Vital signs in last 24 hours: Temp:  [97.8 F (36.6 C)] 97.8 F (36.6 C) (05/06 1255) Pulse Rate:  [54-63] 54 (05/06 1346) Resp:  [9-20] 9 (05/06 1346) BP: (168-171)/(97-100) 168/97 (05/06 1346) SpO2:  [100 %]  100 % (05/06 1346) Weight:  [72.6 kg] 72.6 kg (05/06 1258)  GENERAL: Awake, alert, in no acute distress. Patient does express discomfort with her PIVs with grimace with spontaneous arm movement. Head: Normocephalic and atraumatic, dry mm, no obvious abnormality EENT: Normal conjunctivae, no OP obstruction LUNGS: Normal respiratory effort. Non-labored breathing CV: Bradycardia on cardiac monitor ABDOMEN: Soft, non-tender Ext: warm, well perfused, no obvious abnormality  NEURO:  Mental Status: Awake, alert, and oriented to person, place, age, month, time, and situation. Patient is able to provide a clear and coherent history of present illness.  Speech/Language: speech is intact without dysarthria or aphasia.  Naming, repetition, fluency, and comprehension intact. No neglect is noted.  Cranial Nerves:  II: PERRL. Visual fields full.  III, IV, VI: EOMI without ptosis V: Sensation is intact to  light touch and symmetrical to face.  VII: Face is symmetric resting and smiling. Able to puff cheeks and raise eyebrows.  VIII: Hearing is intact to voice IX, X: Palate elevation is symmetric. Phonation normal.  XI: Normal sternocleidomastoid and trapezius muscle strength XII: Tongue protrudes midline without fasciculations.   Motor: 5/5 strength present in bilateral lower extremities with left lower extremity slightly less vigor on leg raises compared to the left. 5/5 strength in bilateral triceps, deltoids, and right bicep; 4+/5 left bicep.  Tone and bulk are normal. No vertical drift throughout (drift assessment varies; occasionally minimal drift of left upper and lower extremities but inconsistent in examination) Sensation: Intact to light touch bilaterally in all four extremities. No extinction to DSS present. Left hand and arm with decreased sensation to cool temperature compared to the right.  Coordination: FTN intact bilaterally. HKS intact bilaterally.  DTRs: 2+ throughout.  Gait-  deferred  NIHSS: 1a Level of Conscious.: 0 1b LOC Questions: 0 1c LOC Commands: 0 2 Best Gaze: 0 3 Visual: 0 4 Facial Palsy: 0 5a Motor Arm - left: 0 5b Motor Arm - Right: 0 6a Motor Leg - Left: 0 6b Motor Leg - Right: 0 7 Limb Ataxia: 0 8 Sensory: 1; left face and left upper extremity decreased sensation 9 Best Language: 0 10 Dysarthria: 0 11 Extinct. and Inatten.: 0 TOTAL: 1  Labs I have reviewed labs in epic and the results pertinent to this consultation are:  CBC    Component Value Date/Time   WBC 5.7 10/20/2020 1324   RBC 4.15 10/20/2020 1324   HGB 13.1 10/20/2020 1324   HCT 39.2 10/20/2020 1324   PLT 194 10/20/2020 1324   MCV 94.5 10/20/2020 1324   MCH 31.6 10/20/2020 1324   MCHC 33.4 10/20/2020 1324   RDW 11.9 10/20/2020 1324   LYMPHSABS 2.6 10/20/2020 1324   MONOABS 0.4 10/20/2020 1324   EOSABS 0.1 10/20/2020 1324   BASOSABS 0.1 10/20/2020 1324   CMP     Component Value Date/Time   NA 141 10/20/2020 1324   NA 136 (A) 12/27/2015 0000   K 3.9 10/20/2020 1324   CL 103 10/20/2020 1324   CO2 28 10/20/2020 1324   GLUCOSE 90 10/20/2020 1324   BUN 15 10/20/2020 1324   BUN 15 12/27/2015 0000   CREATININE 1.20 (H) 10/20/2020 1324   CREATININE 1.10 (H) 09/12/2020 0855   CALCIUM 10.1 10/20/2020 1324   PROT 7.0 10/20/2020 1324   ALBUMIN 4.5 10/20/2020 1324   AST 31 10/20/2020 1324   ALT 29 10/20/2020 1324   ALKPHOS 64 10/20/2020 1324   BILITOT 0.4 10/20/2020 1324   GFRNONAA 53 (L) 10/20/2020 1324   GFRAA >60 02/20/2018 2002   Lipid Panel     Component Value Date/Time   CHOL 230 (H) 08/20/2019 1121   TRIG 80.0 08/20/2019 1121   HDL 64.50 08/20/2019 1121   CHOLHDL 4 08/20/2019 1121   VLDL 16.0 08/20/2019 1121   LDLCALC 150 (H) 08/20/2019 1121   Imaging  I have reviewed the images obtained:  CT-scan of the brain: There is no acute intracranial hemorrhage or evidence of acute infarction. ASPECT score is 10.  MRI / MRA examination of the brain 1.  Limited protocol MRI of the brain without evidence for acute or subacute infarction. 2. Normal MRA circle-of-Willis without evidence for significant proximal stenosis, aneurysm, or branch vessel occlusion.  Assessment: 55 year old female with no stroke risk factors who presented initially to OSH for evaluation of  acute onset of left upper extremity numbness and weakness with associated headache. - Examination reveals persistent left upper extremity tingling but with varying degrees of strength on the left upper and lower extremities (sometimes subtle drift on assessment though inconsistently) and throbbing headache with nausea, photophobia, and phonophobia. - History of complex migraines at least 30 years ago with associated vision loss. Reports increased stress lately and a 2 week history of a throbbing, holocephalic headache with periorbital and retroorbital involvement from a 3-10/10 in severity.  - CT without acute intracranial abnormality, unable to obtain CTA due to documented anaphylaxis allergy. Pt transferred for STAT MRI to determine if tPA candidate. MRI without acute or subacute infarct and MRA head without LVO.  - Presentation most consistent with complex migraine with 2 week history of throbbing headache with associated nausea, photo/phonophobia, and remote history of complex migraines. Less likely acute stroke due to normal brain and cerebral vessel imaging. 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  Impression: Complex migraine  Recommendations: - Admit for observation overnight - Neuro checks - Phenergan 31m IV once for migraine and nausea management - IVF - Gradual normalization of blood pressure - Encourage PO caffeine intake if concern for caffeine  withdrawal headache  Pt seen by NP/Neuro  SAnibal Henderson AGAC-NP [facility] Pager: [phone] I have seen and examined the patient. I have formulated the assessment and recommendations. My exam findings were observed and documented by SAnibal Henderson NP Electronically signed: Dr. EKerney Elbe

## 2020-10-20 NOTE — ED Triage Notes (Addendum)
Left arm weakness and pain . No known injury. Hx anxiety . Blurry vision. Ambulatory with steady gait. No further neuro deficit.

## 2020-10-20 NOTE — Telephone Encounter (Signed)
Nurse Assessment Nurse: Eugenio Hoes, RN, Jenny Reichmann Date/Time (Eastern Time): 10/20/2020 12:06:47 PM Confirm and document reason for call. If symptomatic, describe symptoms. ---Caller states that her blood pressure has been climbing into the 150s for the past two weeks. Left arm is getting tingly, just started. Denies headache at this time. She has been waking up with headaches. Not currently taking medications for blood pressure. Does the patient have any new or worsening symptoms? ---Yes Will a triage be completed? ---Yes Related visit to physician within the last 2 weeks? ---Yes Does the PT have any chronic conditions? (i.e. diabetes, asthma, this includes High risk factors for pregnancy, etc.) ---Yes List chronic conditions. ---stage 3 kidney disease Is this a behavioral health or substance abuse call? ---No Guidelines Guideline Title Affirmed Question Affirmed Notes Nurse Date/Time Eilene Ghazi Time) Neurologic Deficit Back pain (and neurologic deficit) Eugenio Hoes, RN, Jenny Reichmann 10/20/2020 12:10:51 PM Disp. Time Eilene Ghazi Time) Disposition Final User PLEASE NOTE: All timestamps contained within this report are represented as [place] Standard Time. CONFIDENTIALTY NOTICE: This fax transmission is intended only for the addressee. It contains information that is legally privileged, confidential or otherwise protected from use or disclosure. If you are not the intended recipient, you are strictly prohibited from reviewing, disclosing, copying using or disseminating any of this information or taking any action in reliance on or regarding this information. If you have received this fax in error, please notify us immediately by telephone so that we can arrange for its return to [place]. Phone: [phone], Toll-Free: [phone], Fax: [phone] Page: 2 of 2 Call Id: [id number] 10/20/2020 12:19:16 PM See HCP within 4 Hours (or PCP triage) Yes Eugenio Hoes, RN, [place] Disagree/Comply Comply Caller Understands  Yes PreDisposition Call Doctor Care Advice Given Per Guideline SEE HCP (OR PCP TRIAGE) WITHIN 4 HOURS: * IF OFFICE WILL BE OPEN: You need to be seen within the next 3 or 4 hours. Call your doctor (or NP/PA) now or as soon as the office opens. CARE ADVICE given per Neurologic Deficit (Adult) guideline. * IF OFFICE WILL BE CLOSED AND NO PCP (PRIMARY CARE PROVIDER) SECOND-LEVEL TRIAGE: You need to be seen within the next 3 or 4 hours. A nearby Urgent Care Center [facility]) is often a good source of care. Another choice is to go to the ED. Go sooner if you become worse. CALL BACK IF: * You become worse Comments User: Baird Cancer, RN Date/Time Eilene Ghazi Time): 10/20/2020 12:20:53 PM reached out to office and they do not have appointments for the next 4 hours. Advised patient to be seen at an urgent care or ED Referrals GO TO FACILITY UNDECIDE

## 2020-10-20 NOTE — ED Notes (Signed)
Attempted to call report x 3.

## 2020-10-20 NOTE — Code Documentation (Signed)
Stroke Response Nurse Documentation Code Documentation  Candace Allen is a 55 y.o. female arriving to [place] ED via [facility] from [place] with past medical hx of migraines. Code stroke was activated by [place] after she was noted to have a sudden onset of left sided numbness and weakness at 1200 when she was on the phone. Pt was seen and evaluated by Teleneurology MD Bhagat at [facility]. Patient transferred for emergent MRI.   Stroke team at the bedside on patient arrival. Patient to MRI with team. NIHSS 2, see documentation for details and code stroke times. Patient with left leg weakness and left decreased sensation on exam. The following imaging was completed:  MRI. Patient is not a candidate for tPA due to MRI being negative per MD Lindzen. Care/Plan: q 2 mNIHSS/VS. Bedside handoff with ED RN Judson Roch.    Kathrin Greathouse  Stroke Response RN

## 2020-10-20 NOTE — Telephone Encounter (Signed)
Dr.Sliva patient

## 2020-10-20 NOTE — ED Notes (Signed)
Attempted to call report x2

## 2020-10-20 NOTE — ED Provider Notes (Signed)
[place] EMERGENCY DEPT Provider Note   CSN: [id number] Arrival date & time: 10/20/20  1247     History Chief Complaint  Patient presents with  . Weakness    Left arm    Candace Allen is a 55 y.o. female.  At 12 PM she began feeling a little dizzy lightheaded and noticed that her left arm was tingling.  Then it began feeling weak.  She did not have any difficulty with vision, speech, balance.  She checked her blood pressure and found it to be elevated.  No prior history of hypertension.  The history is provided by the patient and the spouse.  Cerebrovascular Accident This is a new problem. The current episode started 1 to 2 hours ago. The problem occurs constantly. The problem has not changed since onset.Pertinent negatives include no chest pain, no abdominal pain, no headaches and no shortness of breath. Nothing aggravates the symptoms. Nothing relieves the symptoms. She has tried nothing for the symptoms. The treatment provided no relief.  Extremity Weakness This is a new problem. The current episode started 1 to 2 hours ago. The problem occurs constantly. The problem has not changed since onset.Pertinent negatives include no chest pain, no abdominal pain, no headaches and no shortness of breath. Nothing aggravates the symptoms. Nothing relieves the symptoms. She has tried nothing for the symptoms. The treatment provided no relief.       Past Medical History:  Diagnosis Date  . Diverticulosis 02/19/2018   By colonoscopy 2016  . Fibroid   . GERD (gastroesophageal reflux disease)   . Hx of concussion   . IBS (irritable bowel syndrome)   . Iron excess 2022  . Kidney disease    Stage III--due to medication dexilant  . Leukopenia 2022  . Prediabetes   . Thyroid disease     Patient Active Problem List   Diagnosis Date Noted  . Postmenopausal 11/09/2019  . Acquired hypothyroidism 11/09/2019  . Weight loss 11/09/2019  . Hypothyroidism (acquired) 08/20/2019  .  On hormone replacement therapy 08/20/2019  . Visit for preventive health examination 08/20/2019  . Encounter for screening mammogram for malignant neoplasm of breast 08/20/2019  . History of endometrial cancer 02/19/2018  . GERD (gastroesophageal reflux disease) 02/19/2018  . IBS (irritable bowel syndrome) 02/19/2018  . Diverticulosis 02/19/2018    Past Surgical History:  Procedure Laterality Date  . COLONOSCOPY  2016  . ENDOMETRIAL ABLATION    . SHOULDER SURGERY Left   . TUBAL LIGATION    . UPPER GASTROINTESTINAL ENDOSCOPY  11/10/2019     OB History    Gravida  2   Para  2   Term      Preterm      AB      Living  2     SAB      IAB      Ectopic      Multiple      Live Births              Family History  Problem Relation Age of Onset  . Testicular cancer Brother   . Esophageal cancer Brother   . Healthy Daughter   . Healthy Son   . Hypotension Maternal Grandmother   . Aortic dissection Maternal Grandmother   . Heart disease Maternal Grandfather   . Diabetes Paternal Grandmother        AODM  . Diabetes Paternal Grandfather        AODM  . Ovarian cancer  Paternal Great-grandmother     Social History   Tobacco Use  . Smoking status: Never Smoker  . Smokeless tobacco: Never Used  Vaping Use  . Vaping Use: Never used  Substance Use Topics  . Alcohol use: Yes    Alcohol/week: 2.0 standard drinks    Types: 2 Standard drinks or equivalent per week    Comment: occasionally  per week  . Drug use: Never    Home Medications Prior to Admission medications   Medication Sig Start Date End Date Taking? Authorizing Provider  docusate sodium (COLACE) 100 MG capsule Take 100 mg by mouth 2 (two) times daily.   Yes [provider]  famotidine (PEPCID) 20 MG tablet Take 20 mg by mouth 2 (two) times daily.   Yes [provider]  hydrOXYzine (ATARAX/VISTARIL) 10 MG tablet Take 0.5-1 tablets (5-10 mg total) by mouth at bedtime as needed.  10/03/20  Yes Maximiano Coss, NP  levothyroxine (SYNTHROID) 75 MCG tablet Take 1 tablet (75 mcg total) by mouth daily. 05/25/20  Yes Shamleffer, Melanie Crazier, MD  loratadine (CLARITIN) 10 MG tablet Take 10 mg by mouth daily.   Yes [provider]  polyethylene glycol (MIRALAX) 17 g packet Take 17 g by mouth every other day. 11/02/19  Yes Armbruster, Carlota Raspberry, MD  sucralfate (CARAFATE) 1 g tablet Take 1 tablet (1 g total) by mouth every 6 (six) hours as needed. Slowy dissolve 1 tablet in 1 Tablespoon of distilled water to make a slurry before ingesting 11/02/19  Yes Armbruster, Carlota Raspberry, MD  venlafaxine XR (EFFEXOR XR) 37.5 MG 24 hr capsule Take 1 capsule (37.5 mg total) by mouth daily. 10/04/20  Yes Nunzio Cobbs, MD  vitamin B-12 (CYANOCOBALAMIN) 1000 MCG tablet Take 1,000 mcg by mouth daily.   Yes [provider]  vitamin k 100 MCG tablet Take 100 mcg by mouth daily.   Yes [provider]  EPINEPHrine 0.3 mg/0.3 mL IJ SOAJ injection Inject 0.3 mg into the muscle as needed for anaphylaxis.    [provider]  Multiple Vitamins-Minerals (MULTIVITAMIN ADULTS 50+ PO) Take 1 tablet by mouth daily. Patient not taking: No sig reported    [provider]    Allergies    Flagyl [metronidazole], Iodine, Tramadol hcl, Gluten meal, and Lactose intolerance (gi)  Review of Systems   Review of Systems  Constitutional: Negative for fever.  HENT: Negative for sore throat.   Eyes: Negative for visual disturbance.  Respiratory: Negative for shortness of breath.   Cardiovascular: Negative for chest pain.  Gastrointestinal: Negative for abdominal pain.  Genitourinary: Negative for dysuria.  Musculoskeletal: Positive for extremity weakness. Negative for neck pain.  Skin: Negative for rash.  Neurological: Positive for dizziness, weakness, light-headedness and numbness. Negative for speech difficulty and headaches.    Physical Exam Updated Vital  Signs BP (!) 171/100 (BP Location: Right Arm)   Pulse 63   Temp 97.8 F (36.6 C) (Oral)   Resp 20   Ht 5' 11"  (1.803 m)   Wt 72.6 kg   SpO2 100%   BMI 22.32 kg/m   Physical Exam Vitals and nursing note reviewed.  Constitutional:      General: She is not in acute distress.    Appearance: Normal appearance. She is well-developed.  HENT:     Head: Normocephalic and atraumatic.  Eyes:     Conjunctiva/sclera: Conjunctivae normal.  Cardiovascular:     Rate and Rhythm: Normal rate and regular rhythm.  Heart sounds: No murmur heard.   Pulmonary:     Effort: Pulmonary effort is normal. No respiratory distress.     Breath sounds: Normal breath sounds.  Abdominal:     Palpations: Abdomen is soft.     Tenderness: There is no abdominal tenderness.  Musculoskeletal:        General: No deformity or signs of injury. Normal range of motion.     Cervical back: Neck supple.  Skin:    General: Skin is warm and dry.     Capillary Refill: Capillary refill takes less than 2 seconds.  Neurological:     Mental Status: She is alert and oriented to person, place, and time.     Cranial Nerves: No cranial nerve deficit.     Sensory: No sensory deficit.     Motor: Weakness present.     Comments: She has 4 out of 5 strength with shoulder shrug biceps triceps handgrips.  Leg strength is probably 4+ out of 5.  Sensory intact to light touch.  Normal cranial nerves and normal speech.     ED Results / Procedures / Treatments   Labs (all labs ordered are listed, but only abnormal results are displayed) Labs Reviewed  COMPREHENSIVE METABOLIC PANEL - Abnormal; Notable for the following components:      Result Value   Creatinine, Ser 1.20 (*)    GFR, Estimated 53 (*)    All other components within normal limits  RESP PANEL BY RT-PCR (FLU A&B, COVID) ARPGX2  ETHANOL  PROTIME-INR  APTT  CBC  DIFFERENTIAL  HIV ANTIBODY (ROUTINE TESTING W REFLEX)  RAPID URINE DRUG SCREEN, HOSP PERFORMED   HEMOGLOBIN A1C  LIPID PANEL  CBG MONITORING, ED  CBG MONITORING, ED    EKG EKG Interpretation  Date/Time:  Friday Oct 20 2020 13:02:42 EDT Ventricular Rate:  58 PR Interval:  119 QRS Duration: 81 QT Interval:  447 QTC Calculation: 439 R Axis:   65 Text Interpretation: Sinus rhythm Borderline short PR interval No significant change since prior 9/19 Confirmed by Aletta Edouard [phone]) on 10/20/2020 1:10:08 PM   Radiology No results found.  Procedures .Critical Care Performed by: Hayden Rasmussen, MD Authorized by: Hayden Rasmussen, MD   Critical care provider statement:    Critical care time (minutes):  45   Critical care time was exclusive of:  Separately billable procedures and treating other patients   Critical care was necessary to treat or prevent imminent or life-threatening deterioration of the following conditions:  CNS failure or compromise   Critical care was time spent personally by me on the following activities:  Discussions with consultants, evaluation of patient's response to treatment, examination of patient, ordering and performing treatments and interventions, ordering and review of laboratory studies, ordering and review of radiographic studies, pulse oximetry, re-evaluation of patient's condition, obtaining history from patient or surrogate, review of old charts and development of treatment plan with patient or surrogate     Medications Ordered in ED Medications  promethazine (PHENERGAN) 12.5 mg in sodium chloride 0.9 % 50 mL IVPB (has no administration in time range)  venlafaxine XR (EFFEXOR-XR) 24 hr capsule 37.5 mg (has no administration in time range)  levothyroxine (SYNTHROID) tablet 75 mcg (has no administration in time range)  docusate sodium (COLACE) capsule 100 mg (has no administration in time range)  famotidine (PEPCID) tablet 20 mg (has no administration in time range)  polyethylene glycol (MIRALAX / GLYCOLAX) packet 17 g (has no  administration in time  range)  loratadine (CLARITIN) tablet 10 mg (has no administration in time range)   stroke: mapping our early stages of recovery book (has no administration in time range)  acetaminophen (TYLENOL) tablet 650 mg (has no administration in time range)    Or  acetaminophen (TYLENOL) 160 MG/5ML solution 650 mg (has no administration in time range)    Or  acetaminophen (TYLENOL) suppository 650 mg (has no administration in time range)  enoxaparin (LOVENOX) injection 40 mg (has no administration in time range)  aspirin EC tablet 81 mg (has no administration in time range)  hydrALAZINE (APRESOLINE) tablet 25 mg (has no administration in time range)    ED Course  I have reviewed the triage vital signs and the nursing notes.  Pertinent labs & imaging results that were available during my care of the patient were reviewed by me and considered in my medical decision making (see chart for details).  Clinical Course as of 10/20/20 1721  Fri Oct 20, 2020  1335 Radiology informed me that the head CT shows no acute stroke or bleed. [MB]  71 Dr. Curly Shores from [facility] is evaluating the patient [MB]  1354 Patient was evaluated by Dr. Curly Shores and feels that she needs emergent MRI to exclude stroke.  She has updated the neurology team at [facility] and I have discussed with Dr. Rogene Houston ED physician who is excepting her.  We are contacting ambulance for emergency transfer.  Patient agreeable to plan. [MB]    Clinical Course User Index [MB] Hayden Rasmussen, MD   MDM Rules/Calculators/A&P                          This patient complains of left-sided numbness and weakness; this involves an extensive number of treatment Options and is a complaint that carries with it a high risk of complications and Morbidity. The differential includes stroke, dissection, hypoglycemia, hypertensive emergency, bleed, complex migraine, hypoglycemia  I ordered, reviewed and interpreted labs, which included  CBC with normal white count normal hemoglobin, chemistries fairly normal other than elevated creatinine, glucose normal I ordered imaging studies which included CT head and I independently    visualized and interpreted imaging which showed no acute findings Additional history obtained from patient's husband Previous records obtained and reviewed in epic, no recent admissions I consulted teleneurology Dr. Curly Shores and ED physician Dr. Rogene Houston and discussed lab and imaging findings  Critical Interventions: Emergent work-up and evaluation for possible strokelike symptoms with consideration for tPA.  After the interventions stated above, I reevaluated the patient and found patient to be having somewhat improving symptoms.  In consultation with neurology patient will be transferred to [facility] for emergent MRI as she is still in the window.  Final Clinical Impression(s) / ED Diagnoses Final diagnoses:  Left arm weakness    Rx / DC Orders ED Discharge Orders    None       Hayden Rasmussen, MD 10/20/20 1724

## 2020-10-20 NOTE — ED Notes (Signed)
Code stroke called, pt transported to CT

## 2020-10-20 NOTE — ED Provider Notes (Signed)
Update note  See separate ED provider note.  Seen initially at [facility] ER, tele neuro saw pt. [facility] for [facility], reassessment by neuro. Symptoms imrpoving. MRI negative. Neurology rec hosp admission for further observation.   Lucrezia Starch, MD 10/20/20 [phone]

## 2020-10-20 NOTE — ED Notes (Signed)
ED Provider at bedside. 

## 2020-10-20 NOTE — ED Notes (Signed)
Patient in CT; will obtain vitals as soon as patient returns to room.

## 2020-10-21 ENCOUNTER — Observation Stay (HOSPITAL_BASED_OUTPATIENT_CLINIC_OR_DEPARTMENT_OTHER): Payer: 59

## 2020-10-21 DIAGNOSIS — R29898 Other symptoms and signs involving the musculoskeletal system: Secondary | ICD-10-CM

## 2020-10-21 DIAGNOSIS — G459 Transient cerebral ischemic attack, unspecified: Secondary | ICD-10-CM | POA: Diagnosis not present

## 2020-10-21 LAB — LIPID PANEL
Cholesterol: 179 mg/dL (ref 0–200)
HDL: 63 mg/dL (ref >40)
LDL Cholesterol: 101 mg/dL — ABNORMAL HIGH (ref 0–99)
Total CHOL/HDL Ratio: 2.8 RATIO
Triglycerides: 77 mg/dL (ref <150)
VLDL: 15 mg/dL (ref 0–40)

## 2020-10-21 LAB — HEMOGLOBIN A1C
Hgb A1c MFr Bld: 5.2 % (ref 4.8–5.6)
Mean Plasma Glucose: 102.54 mg/dL

## 2020-10-21 LAB — HIV ANTIBODY (ROUTINE TESTING W REFLEX): HIV Screen 4th Generation wRfx: NONREACTIVE

## 2020-10-21 MED ORDER — STROKE: EARLY STAGES OF RECOVERY BOOK
Status: AC
  Filled 2020-10-21: qty 1

## 2020-10-21 MED ORDER — KETOROLAC TROMETHAMINE 30 MG/ML IJ SOLN
30.0000 mg | Freq: Once | INTRAMUSCULAR | Status: AC | PRN
  Administered 2020-10-21: 30 mg via INTRAVENOUS
  Filled 2020-10-21: qty 1

## 2020-10-21 MED ORDER — ALPRAZOLAM 0.5 MG PO TABS
0.5000 mg | ORAL_TABLET | Freq: Once | ORAL | Status: AC
  Administered 2020-10-21: 0.5 mg via ORAL
  Filled 2020-10-21: qty 1

## 2020-10-21 NOTE — Progress Notes (Signed)
Carotid duplex bilateral study completed.   Please see CV Proc for preliminary results.   Nussen Pullin, RDMS, RVT  

## 2020-10-21 NOTE — Discharge Summary (Signed)
Left AMA Patient was mated for work-up for TIA with left upper extremity numbness and headache.  Patient already had MRI/MRA brain, carotid Dopplers which are unremarkable.  Echocardiogram was pending.  However patient did not want to wait for echocardiogram and left AMA.

## 2020-10-21 NOTE — Progress Notes (Signed)
OT Cancellation Note  Patient Details Name: Candace Allen MRN: [id number] DOB: 24-Dec-1965   Cancelled Treatment:    Reason Eval/Treat Not Completed: OT screened, no needs identified, will sign off.  Notified via PT that patient is at baseline for all mobility and ADL completion.    Ruel Dimmick D Joelly Bolanos 10/21/2020, 10:16 AM

## 2020-10-21 NOTE — Evaluation (Signed)
Physical Therapy Evaluation & Discharge Patient Details Name: Candace Allen MRN: [id number] DOB: [birth year] Today's Date: 10/21/2020   History of Present Illness  55 y/o female presented to [facility] ED on 5/6 with complaints of dizziness and L arm numbness/tingling. Transferred to [facility] MRI negative. Found to have complex migraine. PMH: diverticulosis, GERD, IBS, kidney disease, leukopenia, thyroid disease  Clinical Impression  Patient currently functioning at independent level. Patient scored 24/24 on DGI. No apparent balance deficits noted throughout. No further skilled PT needs required acutely. No PT follow up recommended at this time. PT will sign off.     Follow Up Recommendations No PT follow up    Equipment Recommendations  None recommended by PT    Recommendations for Other Services       Precautions / Restrictions Precautions Precautions: None      Mobility  Bed Mobility Overal bed mobility: Independent                  Transfers Overall transfer level: Independent                  Ambulation/Gait Ambulation/Gait assistance: Independent Gait Distance (Feet): 300 Feet Assistive device: None Gait Pattern/deviations: WFL(Within Functional Limits)   Gait velocity interpretation: >4.37 ft/sec, indicative of normal walking speed    Stairs Stairs: Yes Stairs assistance: Independent Stair Management: No rails;Alternating pattern;Forwards Number of Stairs: 12    Wheelchair Mobility    Modified Rankin (Stroke Patients Only)       Balance Overall balance assessment: No apparent balance deficits (not formally assessed)                               Standardized Balance Assessment Standardized Balance Assessment : Dynamic Gait Index   Dynamic Gait Index Level Surface: Normal Change in Gait Speed: Normal Gait with Horizontal Head Turns: Normal Gait with Vertical Head Turns: Normal Gait and Pivot Turn: Normal Step Over  Obstacle: Normal Step Around Obstacles: Normal Steps: Normal Total Score: 24       Pertinent Vitals/Pain Pain Assessment: Faces Faces Pain Scale: Hurts even more Pain Location: headache Pain Descriptors / Indicators: Headache Pain Intervention(s): Monitored during session    Home Living Family/patient expects to be discharged to:: Private residence Living Arrangements: Spouse/significant other Available Help at Discharge:  (taking care of husband currently since he recently had heart surgery and on precautions) Type of Home: House Home Access: Level entry     Home Layout: Two level Home Equipment: None      Prior Function Level of Independence: Independent         Comments: driving, working     Hand Dominance   Dominant Hand: Left    Extremity/Trunk Assessment   Upper Extremity Assessment Upper Extremity Assessment: Overall WFL for tasks assessed    Lower Extremity Assessment Lower Extremity Assessment: Overall WFL for tasks assessed    Cervical / Trunk Assessment Cervical / Trunk Assessment: Normal  Communication   Communication: No difficulties  Cognition Arousal/Alertness: Awake/alert Behavior During Therapy: WFL for tasks assessed/performed Overall Cognitive Status: Within Functional Limits for tasks assessed                                        General Comments      Exercises     Assessment/Plan    PT Assessment  Patent does not need any further PT services  PT Problem List         PT Treatment Interventions      PT Goals (Current goals can be found in the Care Plan section)  Acute Rehab PT Goals Patient Stated Goal: to go home PT Goal Formulation: All assessment and education complete, DC therapy    Frequency     Barriers to discharge        Co-evaluation               AM-PAC PT "6 Clicks" Mobility  Outcome Measure Help needed turning from your back to your side while in a flat bed without using  bedrails?: None Help needed moving from lying on your back to sitting on the side of a flat bed without using bedrails?: None Help needed moving to and from a bed to a chair (including a wheelchair)?: None Help needed standing up from a chair using your arms (e.g., wheelchair or bedside chair)?: None Help needed to walk in hospital room?: None Help needed climbing 3-5 steps with a railing? : None 6 Click Score: 24    End of Session   Activity Tolerance: Patient tolerated treatment well Patient left: in bed;with call bell/phone within reach;with family/visitor present Nurse Communication: Mobility status PT Visit Diagnosis: Muscle weakness (generalized) (M62.81)    Time: 4431-5400 PT Time Calculation (min) (ACUTE ONLY): 18 min   Charges:   PT Evaluation $PT Eval Low Complexity: 1 Low          Shrihan Putt A. Gilford Rile PT, DPT Acute Rehabilitation Services Pager [phone] Office [phone]   Linna Hoff 10/21/2020, 9:10 AM

## 2020-10-23 ENCOUNTER — Ambulatory Visit (INDEPENDENT_AMBULATORY_CARE_PROVIDER_SITE_OTHER): Payer: 59 | Admitting: Registered Nurse

## 2020-10-23 ENCOUNTER — Encounter: Payer: Self-pay | Admitting: Registered Nurse

## 2020-10-23 ENCOUNTER — Ambulatory Visit (INDEPENDENT_AMBULATORY_CARE_PROVIDER_SITE_OTHER): Payer: 59

## 2020-10-23 ENCOUNTER — Other Ambulatory Visit: Payer: 59

## 2020-10-23 ENCOUNTER — Other Ambulatory Visit: Payer: Self-pay

## 2020-10-23 VITALS — BP 139/83 | HR 74 | Temp 98.4°F | Resp 17 | Ht 71.0 in | Wt 161.2 lb

## 2020-10-23 DIAGNOSIS — G4486 Cervicogenic headache: Secondary | ICD-10-CM

## 2020-10-23 DIAGNOSIS — R030 Elevated blood-pressure reading, without diagnosis of hypertension: Secondary | ICD-10-CM

## 2020-10-23 DIAGNOSIS — R2990 Unspecified symptoms and signs involving the nervous system: Secondary | ICD-10-CM | POA: Diagnosis not present

## 2020-10-23 MED ORDER — METHOCARBAMOL 500 MG PO TABS: 500.0000 mg | ORAL_TABLET | Freq: Four times a day (QID) | ORAL | 0 refills | Status: DC | PRN

## 2020-10-23 NOTE — Progress Notes (Signed)
Acute Office Visit  Subjective:    Patient ID: Candace Allen, female    DOB: December 06, 1965, 55 y.o.   MRN: [id number]  Chief Complaint  Patient presents with  . Headache    Pt has had headache for 2 weeks, notes high bp on check at urgent care.     HPI Patient is in today for headache  Onset 2 weeks ago. New daily persistent headache. Was seen in ED after developing LUE weakness, normal brain MRI/MRA. Left AMA before planned echocardiogram  Has been under profound stress lately. She has been on effexor for some time which she notes to have helped. She had been using melatonin for sleep but at her last visit with me on 10/03/20 we started a low dose of hydroxyzine before bed to help with sleep. She has not been taking this as she is concerned that it is adversely affecting her bp  Hx of BP running well within normal range. Unfortunately in ER it was profoundly elevated to 160s/90s. Borderline today in office at 139/83. Denies peripheral neuro symptoms and cognitive changes. No CV symptoms at this time.  Notes that she has had hx of injury to L shoulder - tore rotator cuff and had repair around 5 years ago. Some hx of "pinched nerve" in neck.   Headache originates at base of skull in back. Wraps up around head. Feels like pulling.   Full ROM in neck  Was given dose of alprazolam in ER that sedated her but did not relieve headache  Past Medical History:  Diagnosis Date  . Diverticulosis 02/19/2018   By colonoscopy 2016  . Fibroid   . GERD (gastroesophageal reflux disease)   . Hx of concussion   . IBS (irritable bowel syndrome)   . Iron excess 2022  . Kidney disease    Stage III--due to medication dexilant  . Leukopenia 2022  . Prediabetes   . Thyroid disease     Past Surgical History:  Procedure Laterality Date  . COLONOSCOPY  2016  . ENDOMETRIAL ABLATION    . SHOULDER SURGERY Left   . TUBAL LIGATION    . UPPER GASTROINTESTINAL ENDOSCOPY  11/10/2019    Family History   Problem Relation Age of Onset  . Testicular cancer Brother   . Esophageal cancer Brother   . Healthy Daughter   . Healthy Son   . Hypotension Maternal Grandmother   . Aortic dissection Maternal Grandmother   . Heart disease Maternal Grandfather   . Diabetes Paternal Grandmother        AODM  . Diabetes Paternal Grandfather        AODM  . Ovarian cancer Paternal Great-grandmother     Social History   Socioeconomic History  . Marital status: Married    Spouse name: Not on file  . Number of children: 2  . Years of education: Not on file  . Highest education level: Not on file  Occupational History  . Occupation: Neurosurgeon  Tobacco Use  . Smoking status: Never Smoker  . Smokeless tobacco: Never Used  Vaping Use  . Vaping Use: Never used  Substance and Sexual Activity  . Alcohol use: Yes    Alcohol/week: 2.0 standard drinks    Types: 2 Standard drinks or equivalent per week    Comment: occasionally  per week  . Drug use: Never  . Sexual activity: Yes    Birth control/protection: Surgical    Comment: Ablation  Other Topics Concern  . Not on  file  Social History Narrative  . Not on file   Social Determinants of Health   Financial Resource Strain: Not on file  Food Insecurity: Not on file  Transportation Needs: Not on file  Physical Activity: Not on file  Stress: Not on file  Social Connections: Not on file  Intimate Partner Violence: Not on file    Outpatient Medications Prior to Visit  Medication Sig Dispense Refill  . docusate sodium (COLACE) 100 MG capsule Take 100 mg by mouth 2 (two) times daily.    Marland Kitchen EPINEPHrine 0.3 mg/0.3 mL IJ SOAJ injection Inject 0.3 mg into the muscle as needed for anaphylaxis.    . famotidine (PEPCID) 20 MG tablet Take 20 mg by mouth 2 (two) times daily.    . hydrOXYzine (ATARAX/VISTARIL) 10 MG tablet Take 0.5-1 tablets (5-10 mg total) by mouth at bedtime as needed. (Patient taking differently: Take 5-10 mg by mouth at bedtime as  needed (sleep).) 30 tablet 0  . levothyroxine (SYNTHROID) 75 MCG tablet Take 1 tablet (75 mcg total) by mouth daily. 90 tablet 3  . loratadine (CLARITIN) 10 MG tablet Take 10 mg by mouth daily.    . polyethylene glycol (MIRALAX) 17 g packet Take 17 g by mouth every other day. 14 each 0  . sucralfate (CARAFATE) 1 g tablet Take 1 tablet (1 g total) by mouth every 6 (six) hours as needed. Slowy dissolve 1 tablet in 1 Tablespoon of distilled water to make a slurry before ingesting (Patient taking differently: Take 1 g by mouth every 6 (six) hours as needed (ibs). Slowy dissolve 1 tablet in 1 Tablespoon of distilled water to make a slurry before ingesting) 90 tablet 2  . venlafaxine XR (EFFEXOR XR) 37.5 MG 24 hr capsule Take 1 capsule (37.5 mg total) by mouth daily. 90 capsule 1  . vitamin B-12 (CYANOCOBALAMIN) 1000 MCG tablet Take 1,000 mcg by mouth daily.    . vitamin k 100 MCG tablet Take 100 mcg by mouth daily.     Facility-Administered Medications Prior to Visit  Medication Dose Route Frequency Provider Last Rate Last Admin  . 0.9 %  sodium chloride infusion  500 mL Intravenous Once Armbruster, Carlota Raspberry, MD      . dextrose 5 % solution   Intravenous Continuous Armbruster, Carlota Raspberry, MD        Allergies  Allergen Reactions  . Flagyl [Metronidazole] Anaphylaxis  . Iodine Anaphylaxis  . Tramadol Hcl Other (See Comments)    Showed signs of stroke  . Gluten Meal   . Lactose Intolerance (Gi)     Review of Systems  Constitutional: Negative.   HENT: Negative.   Eyes: Negative.   Respiratory: Negative.   Cardiovascular: Negative.   Gastrointestinal: Negative.   Genitourinary: Negative.   Musculoskeletal: Negative.   Skin: Negative.   Neurological: Negative.   Psychiatric/Behavioral: Negative.   All other systems reviewed and are negative.      Objective:    Physical Exam Vitals and nursing note reviewed.  Constitutional:      General: She is not in acute distress.    Appearance:  Normal appearance. She is not ill-appearing, toxic-appearing or diaphoretic.  Cardiovascular:     Rate and Rhythm: Normal rate and regular rhythm.     Pulses: Normal pulses.     Heart sounds: Normal heart sounds. No murmur heard. No friction rub. No gallop.   Pulmonary:     Effort: Pulmonary effort is normal. No respiratory distress.  Breath sounds: Normal breath sounds. No stridor. No wheezing, rhonchi or rales.  Chest:     Chest wall: No tenderness.  Skin:    General: Skin is warm and dry.     Capillary Refill: Capillary refill takes less than 2 seconds.  Neurological:     General: No focal deficit present.     Mental Status: She is alert and oriented to person, place, and time. Mental status is at baseline.  Psychiatric:        Mood and Affect: Mood normal.        Behavior: Behavior normal.        Thought Content: Thought content normal.        Judgment: Judgment normal.     BP 139/83   Pulse 74   Temp 98.4 F (36.9 C) (Temporal)   Resp 17   Ht 5' 11"  (1.803 m)   Wt 161 lb 3.2 oz (73.1 kg)   SpO2 99%   BMI 22.48 kg/m  Wt Readings from Last 3 Encounters:  10/23/20 161 lb 3.2 oz (73.1 kg)  10/20/20 160 lb (72.6 kg)  10/04/20 162 lb (73.5 kg)    There are no preventive care reminders to display for this patient.  There are no preventive care reminders to display for this patient.   Lab Results  Component Value Date   TSH 2.70 09/12/2020   Lab Results  Component Value Date   WBC 5.7 10/20/2020   HGB 13.1 10/20/2020   HCT 39.2 10/20/2020   MCV 94.5 10/20/2020   PLT 194 10/20/2020   Lab Results  Component Value Date   NA 141 10/20/2020   K 3.9 10/20/2020   CO2 28 10/20/2020   GLUCOSE 90 10/20/2020   BUN 15 10/20/2020   CREATININE 1.20 (H) 10/20/2020   BILITOT 0.4 10/20/2020   ALKPHOS 64 10/20/2020   AST 31 10/20/2020   ALT 29 10/20/2020   PROT 7.0 10/20/2020   ALBUMIN 4.5 10/20/2020   CALCIUM 10.1 10/20/2020   ANIONGAP 10 10/20/2020   GFR  47.57 (L) 08/20/2019   Lab Results  Component Value Date   CHOL 179 10/21/2020   Lab Results  Component Value Date   HDL 63 10/21/2020   Lab Results  Component Value Date   LDLCALC 101 (H) 10/21/2020   Lab Results  Component Value Date   TRIG 77 10/21/2020   Lab Results  Component Value Date   CHOLHDL 2.8 10/21/2020   Lab Results  Component Value Date   HGBA1C 5.2 10/21/2020       Assessment & Plan:   Problem List Items Addressed This Visit   None   Visit Diagnoses    Cervicogenic headache    -  Primary   Relevant Medications   methocarbamol (ROBAXIN) 500 MG tablet   Other Relevant Orders   DG Cervical Spine Complete   Elevated blood pressure reading in office without diagnosis of hypertension       Relevant Orders   Ambulatory referral to Cardiology   Stroke-like episode       Relevant Orders   Ambulatory referral to Cardiology       Meds ordered this encounter  Medications  . methocarbamol (ROBAXIN) 500 MG tablet    Sig: Take 1 tablet (500 mg total) by mouth every 6 (six) hours as needed for muscle spasms.    Dispense:  60 tablet    Refill:  0    Order Specific Question:   Supervising Provider  Answer:   Carlota Raspberry, JEFFREY R [[id number]]   PLAN  Refer to cardiology. Low suspicion for cardiac etiology but would prefer to rule out as she did not stay in hospital long enough to have echo, she had left AMA prior to this. She again expresses understanding of risks and concerns with leaving AMA and is agreeable to outpatient cardiology follow up. Review of EKG on 10/20/20 shows sinus rhythm with borderline short PR, no acute concerns.   Methocarbamol and neck ROM stretching for cervicogenic headache  No recent c-spine imaging available, will order xray and follow up as warranted  Return if worsening or failing to improve  BP borderline today. Will continue to monitor, esp in setting of known renal dysfunction  Patient encouraged to call clinic with any  questions, comments, or concerns.   Maximiano Coss, NP

## 2020-11-06 ENCOUNTER — Other Ambulatory Visit: Payer: Self-pay | Admitting: Registered Nurse

## 2020-11-06 DIAGNOSIS — G479 Sleep disorder, unspecified: Secondary | ICD-10-CM

## 2020-11-06 NOTE — Telephone Encounter (Signed)
LFD 10/03/20 #30 with no refills LOV 10/23/20 NOV none

## 2020-12-14 ENCOUNTER — Telehealth: Payer: Self-pay | Admitting: Registered Nurse

## 2020-12-14 NOTE — Telephone Encounter (Signed)
Pt called in stating that she was seen in the ER on 10/20/20, she has a cards appt on 02/08/21 but was told she needs a EKG in the next few weeks.  I will call cardiology to see about  getting her seen sooner.  Please advise

## 2020-12-18 ENCOUNTER — Emergency Department (HOSPITAL_BASED_OUTPATIENT_CLINIC_OR_DEPARTMENT_OTHER): Payer: 59

## 2020-12-18 ENCOUNTER — Other Ambulatory Visit: Payer: Self-pay

## 2020-12-18 ENCOUNTER — Emergency Department (HOSPITAL_BASED_OUTPATIENT_CLINIC_OR_DEPARTMENT_OTHER)
Admission: EM | Admit: 2020-12-18 | Discharge: 2020-12-18 | Disposition: A | Discharge status: Home / Self Care | Payer: 59 | Attending: Emergency Medicine | Admitting: Emergency Medicine

## 2020-12-18 ENCOUNTER — Encounter (HOSPITAL_BASED_OUTPATIENT_CLINIC_OR_DEPARTMENT_OTHER): Payer: Self-pay | Admitting: Obstetrics and Gynecology

## 2020-12-18 DIAGNOSIS — R079 Chest pain, unspecified: Secondary | ICD-10-CM | POA: Diagnosis not present

## 2020-12-18 DIAGNOSIS — E039 Hypothyroidism, unspecified: Secondary | ICD-10-CM | POA: Insufficient documentation

## 2020-12-18 DIAGNOSIS — T7840XA Allergy, unspecified, initial encounter: Secondary | ICD-10-CM

## 2020-12-18 DIAGNOSIS — R0602 Shortness of breath: Secondary | ICD-10-CM | POA: Diagnosis not present

## 2020-12-18 DIAGNOSIS — R000 Tachycardia, unspecified: Secondary | ICD-10-CM | POA: Diagnosis not present

## 2020-12-18 DIAGNOSIS — Z79899 Other long term (current) drug therapy: Secondary | ICD-10-CM | POA: Diagnosis not present

## 2020-12-18 DIAGNOSIS — T63441A Toxic effect of venom of bees, accidental (unintentional), initial encounter: Secondary | ICD-10-CM | POA: Insufficient documentation

## 2020-12-18 LAB — BASIC METABOLIC PANEL
Anion gap: 12 (ref 5–15)
BUN: 15 mg/dL (ref 6–20)
CO2: 24 mmol/L (ref 22–32)
Calcium: 10.1 mg/dL (ref 8.9–10.3)
Chloride: 102 mmol/L (ref 98–111)
Creatinine, Ser: 1.23 mg/dL — ABNORMAL HIGH (ref 0.44–1.00)
GFR, Estimated: 52 mL/min — ABNORMAL LOW (ref >60)
Glucose, Bld: 130 mg/dL — ABNORMAL HIGH (ref 70–99)
Potassium: 3.8 mmol/L (ref 3.5–5.1)
Sodium: 138 mmol/L (ref 135–145)

## 2020-12-18 LAB — TROPONIN I (HIGH SENSITIVITY)
Troponin I (High Sensitivity): 3 ng/L (ref <18)
Troponin I (High Sensitivity): 5 ng/L (ref <18)

## 2020-12-18 LAB — CBC
HCT: 41.3 % (ref 36.0–46.0)
Hemoglobin: 14 g/dL (ref 12.0–15.0)
MCH: 31.4 pg (ref 26.0–34.0)
MCHC: 33.9 g/dL (ref 30.0–36.0)
MCV: 92.6 fL (ref 80.0–100.0)
Platelets: 263 10*3/uL (ref 150–400)
RBC: 4.46 MIL/uL (ref 3.87–5.11)
RDW: 11.9 % (ref 11.5–15.5)
WBC: 5.9 10*3/uL (ref 4.0–10.5)
nRBC: 0 % (ref 0.0–0.2)

## 2020-12-18 MED ORDER — ONDANSETRON 4 MG PO TBDP
8.0000 mg | ORAL_TABLET | Freq: Once | ORAL | Status: AC
Start: 2020-12-18 — End: 2020-12-18
  Administered 2020-12-18: 8 mg via ORAL
  Filled 2020-12-18: qty 2

## 2020-12-18 MED ORDER — SODIUM CHLORIDE 0.9 % IV SOLN: INTRAVENOUS | Status: DC

## 2020-12-18 MED ORDER — FAMOTIDINE IN NACL 20-0.9 MG/50ML-% IV SOLN
20.0000 mg | Freq: Once | INTRAVENOUS | Status: AC
  Administered 2020-12-18: 20 mg via INTRAVENOUS
  Filled 2020-12-18: qty 50

## 2020-12-18 MED ORDER — METHYLPREDNISOLONE SODIUM SUCC 125 MG IJ SOLR
125.0000 mg | Freq: Once | INTRAMUSCULAR | Status: AC
  Administered 2020-12-18: 125 mg via INTRAVENOUS
  Filled 2020-12-18: qty 2

## 2020-12-18 MED ORDER — EPINEPHRINE 0.3 MG/0.3ML IJ SOAJ: 0.3000 mg | INTRAMUSCULAR | 0 refills | Status: DC | PRN

## 2020-12-18 MED ORDER — FAMOTIDINE 20 MG PO TABS: 20.0000 mg | ORAL_TABLET | Freq: Two times a day (BID) | ORAL | 0 refills | Status: DC

## 2020-12-18 MED ORDER — PREDNISONE 10 MG PO TABS: 40.0000 mg | ORAL_TABLET | Freq: Every day | ORAL | 0 refills | Status: DC

## 2020-12-18 MED ORDER — ONDANSETRON HCL 4 MG/2ML IJ SOLN
4.0000 mg | Freq: Once | INTRAMUSCULAR | Status: AC
  Administered 2020-12-18: 4 mg via INTRAVENOUS
  Filled 2020-12-18: qty 2

## 2020-12-18 NOTE — ED Triage Notes (Signed)
Patient was reportedly stung multiple times by bees about 20 minutes ago. Patient was given 169m of benadryl since.  Patient endorses chest pain and reports trouble talking.

## 2020-12-18 NOTE — ED Notes (Signed)
Pt report a few red areas on both forearms. Dr. Venita Sheffield notified

## 2020-12-18 NOTE — ED Notes (Signed)
Pt's color looks much better and is talking much better

## 2020-12-18 NOTE — ED Provider Notes (Addendum)
[facility] DEPT Provider Note   CSN: [id number] Arrival date & time: 12/18/20  1301     History Chief Complaint  Patient presents with   Chest Pain   Allergic Reaction    Candace Allen is a 55 y.o. female.  Patient is 15 minutes prior to arrival stung by multiple bees.  Patient's husband felt they were honeybees.  Patient without a known allergy to bee stings.  Patient feels as if her throat is closing.  Having difficulty breathing and also having chest pain.      Past Medical History:  Diagnosis Date   Diverticulosis 02/19/2018   By colonoscopy 2016   Fibroid    GERD (gastroesophageal reflux disease)    Hx of concussion    IBS (irritable bowel syndrome)    Iron excess 2022   Kidney disease    Stage III--due to medication dexilant   Leukopenia 2022   Prediabetes    Thyroid disease     Patient Active Problem List   Diagnosis Date Noted   TIA (transient ischemic attack) 10/20/2020   Postmenopausal 11/09/2019   Acquired hypothyroidism 11/09/2019   Weight loss 11/09/2019   Hypothyroidism (acquired) 08/20/2019   On hormone replacement therapy 08/20/2019   Visit for preventive health examination 08/20/2019   Encounter for screening mammogram for malignant neoplasm of breast 08/20/2019   History of endometrial cancer 02/19/2018   GERD (gastroesophageal reflux disease) 02/19/2018   IBS (irritable bowel syndrome) 02/19/2018   Diverticulosis 02/19/2018    Past Surgical History:  Procedure Laterality Date   COLONOSCOPY  2016   ENDOMETRIAL ABLATION     SHOULDER SURGERY Left    TUBAL LIGATION     UPPER GASTROINTESTINAL ENDOSCOPY  11/10/2019     OB History     Gravida  2   Para  2   Term      Preterm      AB      Living  2      SAB      IAB      Ectopic      Multiple      Live Births              Family History  Problem Relation Age of Onset   Testicular cancer Brother    Esophageal cancer Brother    Healthy  Daughter    Healthy Son    Hypotension Maternal Grandmother    Aortic dissection Maternal Grandmother    Heart disease Maternal Grandfather    Diabetes Paternal Grandmother        AODM   Diabetes Paternal Grandfather        AODM   Ovarian cancer Paternal Great-grandmother     Social History   Tobacco Use   Smoking status: Never   Smokeless tobacco: Never  Vaping Use   Vaping Use: Never used  Substance Use Topics   Alcohol use: Yes    Alcohol/week: 2.0 standard drinks    Types: 2 Standard drinks or equivalent per week    Comment: occasionally  per week   Drug use: Never    Home Medications Prior to Admission medications   Medication Sig Start Date End Date Taking? Authorizing Provider  docusate sodium (COLACE) 100 MG capsule Take 100 mg by mouth 2 (two) times daily.    [provider]  EPINEPHrine 0.3 mg/0.3 mL IJ SOAJ injection Inject 0.3 mg into the muscle as needed for anaphylaxis.    [provider]  famotidine (PEPCID) 20 MG tablet Take 20 mg by mouth 2 (two) times daily.    [provider]  hydrOXYzine (ATARAX/VISTARIL) 10 MG tablet Take 0.5-1 tablets (5-10 mg total) by mouth at bedtime as needed (sleep). 11/06/20   Maximiano Coss, NP  levothyroxine (SYNTHROID) 75 MCG tablet Take 1 tablet (75 mcg total) by mouth daily. 05/25/20   Shamleffer, Melanie Crazier, MD  loratadine (CLARITIN) 10 MG tablet Take 10 mg by mouth daily.    [provider]  methocarbamol (ROBAXIN) 500 MG tablet Take 1 tablet (500 mg total) by mouth every 6 (six) hours as needed for muscle spasms. 10/23/20   Maximiano Coss, NP  polyethylene glycol (MIRALAX) 17 g packet Take 17 g by mouth every other day. 11/02/19   Armbruster, Carlota Raspberry, MD  sucralfate (CARAFATE) 1 g tablet Take 1 tablet (1 g total) by mouth every 6 (six) hours as needed. Slowy dissolve 1 tablet in 1 Tablespoon of distilled water to make a slurry before ingesting Patient taking differently: Take 1 g by  mouth every 6 (six) hours as needed (ibs). Slowy dissolve 1 tablet in 1 Tablespoon of distilled water to make a slurry before ingesting 11/02/19   Armbruster, Carlota Raspberry, MD  venlafaxine XR (EFFEXOR XR) 37.5 MG 24 hr capsule Take 1 capsule (37.5 mg total) by mouth daily. 10/04/20   Nunzio Cobbs, MD  vitamin B-12 (CYANOCOBALAMIN) 1000 MCG tablet Take 1,000 mcg by mouth daily.    [provider]  vitamin k 100 MCG tablet Take 100 mcg by mouth daily.    [provider]    Allergies    Flagyl [metronidazole], Iodine, Tramadol hcl, Gluten meal, and Lactose intolerance (gi)  Review of Systems   Review of Systems  Constitutional:  Negative for chills and fever.  HENT:  Positive for trouble swallowing. Negative for rhinorrhea and sore throat.   Eyes:  Negative for visual disturbance.  Respiratory:  Positive for shortness of breath. Negative for cough.   Cardiovascular:  Positive for chest pain. Negative for leg swelling.  Gastrointestinal:  Negative for abdominal pain, diarrhea, nausea and vomiting.  Genitourinary:  Negative for dysuria.  Musculoskeletal:  Negative for back pain and neck pain.  Skin:  Negative for rash.  Neurological:  Negative for dizziness, light-headedness and headaches.  Hematological:  Does not bruise/bleed easily.  Psychiatric/Behavioral:  Negative for confusion.    Physical Exam Updated Vital Signs BP 136/80   Pulse (!) 59   Temp 98.2 F (36.8 C) (Oral)   Resp 11   Ht 1.803 m (5' 11" )   Wt 72.6 kg   SpO2 100%   BMI 22.32 kg/m   Physical Exam Vitals and nursing note reviewed.  Constitutional:      General: She is in acute distress.     Appearance: Normal appearance. She is well-developed.  HENT:     Head: Normocephalic and atraumatic.  Eyes:     Extraocular Movements: Extraocular movements intact.     Conjunctiva/sclera: Conjunctivae normal.     Pupils: Pupils are equal, round, and reactive to light.  Cardiovascular:     Rate  and Rhythm: Normal rate and regular rhythm.     Heart sounds: No murmur heard. Pulmonary:     Effort: Pulmonary effort is normal. No respiratory distress.     Breath sounds: Normal breath sounds. No stridor. No wheezing.  Abdominal:     Palpations: Abdomen is soft.     Tenderness: There is no abdominal  tenderness.  Musculoskeletal:        General: Normal range of motion.     Cervical back: Normal range of motion and neck supple.  Skin:    General: Skin is warm and dry.  Neurological:     General: No focal deficit present.     Mental Status: She is alert and oriented to person, place, and time.     Cranial Nerves: No cranial nerve deficit.     Sensory: No sensory deficit.     Motor: No weakness.    ED Results / Procedures / Treatments   Labs (all labs ordered are listed, but only abnormal results are displayed) Labs Reviewed  BASIC METABOLIC PANEL - Abnormal; Notable for the following components:      Result Value   Glucose, Bld 130 (*)    Creatinine, Ser 1.23 (*)    GFR, Estimated 52 (*)    All other components within normal limits  CBC  TROPONIN I (HIGH SENSITIVITY)  TROPONIN I (HIGH SENSITIVITY)    EKG EKG Interpretation  Date/Time:  Monday December 18 2020 13:06:24 EDT Ventricular Rate:  102 PR Interval:  145 QRS Duration: 84 QT Interval:  368 QTC Calculation: 480 R Axis:   72 Text Interpretation: Sinus tachycardia Probable left atrial enlargement Borderline T wave abnormalities Baseline wander in lead(s) V2 No significant change since last tracing Confirmed by Fredia Sorrow [phone]) on 12/18/2020 2:04:17 PM  Radiology DG Chest Port 1 View  Result Date: 12/18/2020 CLINICAL DATA:  Patient was reportedly stung multiple times by bees about 20 minutes ago. Patient was given 152m of benadryl since. Patient endorses chest pain and reports trouble talking. EXAM: PORTABLE CHEST 1 VIEW COMPARISON:  None. FINDINGS: Cardiac silhouette is normal in size. Normal mediastinal and  hilar contours. Clear lungs.  No pleural effusion or pneumothorax. Skeletal structures are grossly intact. IMPRESSION: No active disease. Electronically Signed   By: DLajean ManesM.D.   On: 12/18/2020 13:27    Procedures Procedures   CRITICAL CARE Performed by: SFredia SorrowTotal critical care time: 40 minutes Critical care time was exclusive of separately billable procedures and treating other patients. Critical care was necessary to treat or prevent imminent or life-threatening deterioration. Critical care was time spent personally by me on the following activities: development of treatment plan with patient and/or surrogate as well as nursing, discussions with consultants, evaluation of patient's response to treatment, examination of patient, obtaining history from patient or surrogate, ordering and performing treatments and interventions, ordering and review of laboratory studies, ordering and review of radiographic studies, pulse oximetry and re-evaluation of patient's condition.  Medications Ordered in ED Medications  0.9 %  sodium chloride infusion ( Intravenous New Bag/Given 12/18/20 1336)  famotidine (PEPCID) IVPB 20 mg premix (0 mg Intravenous Stopped 12/18/20 1401)  methylPREDNISolone sodium succinate (SOLU-MEDROL) 125 mg/2 mL injection 125 mg (125 mg Intravenous Given 12/18/20 1326)  ondansetron (ZOFRAN) injection 4 mg (4 mg Intravenous Given 12/18/20 1327)    ED Course  I have reviewed the triage vital signs and the nursing notes.  Pertinent labs & imaging results that were available during my care of the patient were reviewed by me and considered in my medical decision making (see chart for details).    MDM Rules/Calculators/A&P                          Patient with overall improvement with medication treatments here.  Patient received 125 mg  Solu-Medrol and 20 mg of IV Pepcid.  Did not receive any Benadryl.  Because her husband gave her 100 mg of Benadryl at home.  This was  orally.  Patient has a history of a severe seafood allergy.  Patient initially felt as if she had tongue swelling some difficulty swallowing.  And also had chest pain.  Patient's initial troponin normal labs without significant abnormalities.  However will need delta troponin.  Patient did not give herself her EpiPen that she has at home. Patient received 2 stings one in the right neck and 1 in the right groin area.  No evidence of any residual stinger.  If patient's second troponin is negative patient can be discharged home prescriptions provided for a EpiPen also will continue Benadryl 25 mg every 6 hours for the next 24 hours and Pepcid for the next 7 days and prednisone for the next 5 days.  Final Clinical Impression(s) / ED Diagnoses Final diagnoses:  Bee sting, accidental or unintentional, initial encounter  Allergic reaction, initial encounter    Rx / DC Orders ED Discharge Orders     None        Fredia Sorrow, MD 12/18/20 1646    Fredia Sorrow, MD 12/18/20 1650

## 2020-12-18 NOTE — Discharge Instructions (Addendum)
Take the Pepcid for the next 7 days.  Take the prednisone for the next 5 days.  Take Benadryl 25 mg every 6 hours for the next 24 hours.  Prescriptions provided for EpiPen's.  Return for any new or worse symptoms.  Based on today's reaction you definitely have a bee allergy.

## 2020-12-26 NOTE — Telephone Encounter (Signed)
Ok to have her in for appt to get EKG here if she'd like, or she can jump on cards wait list -  Thanks,  Denice Paradise

## 2020-12-26 NOTE — Telephone Encounter (Signed)
I think it's reasonable to see him - I would have to defer to him to order echo as I cannot interpret those results  Thank you  Denice Paradise

## 2021-01-12 ENCOUNTER — Ambulatory Visit: Payer: 59 | Admitting: Cardiology

## 2021-01-15 ENCOUNTER — Telehealth: Payer: Self-pay | Admitting: Cardiovascular Disease

## 2021-01-15 NOTE — Telephone Encounter (Signed)
I called pt and informed her of 4pm office visit with Dr. Angelena Form on 01/16/21.  Also, informed her to arrive 15 minutes early.  She verbalizes understanding.

## 2021-01-15 NOTE — Telephone Encounter (Signed)
New  Message:     Patient said her husband saw Dr Angelena Form last week for an office visit and she was with him. She said Dr Angelena Form told her that he would see her as a New Pt on Tuesday afternoon (8-2-)and would have somebody to work on it. She says she have not heard anything at this time.

## 2021-01-15 NOTE — Telephone Encounter (Signed)
Called pt in regards to 01/16/21 appointment.  I informed her that MD does not have any openings for tomorrow.  She expressed that she spoke with MD at husbands OV and was told that she would be worked in on the evening of 8/2.  Will route to MD to see if pt can be added to 8/2 schedule.

## 2021-01-16 ENCOUNTER — Other Ambulatory Visit: Payer: Self-pay

## 2021-01-16 ENCOUNTER — Encounter: Payer: Self-pay | Admitting: Cardiovascular Disease

## 2021-01-16 ENCOUNTER — Ambulatory Visit (INDEPENDENT_AMBULATORY_CARE_PROVIDER_SITE_OTHER): Payer: 59 | Admitting: Cardiovascular Disease

## 2021-01-16 VITALS — BP 120/90 | HR 69 | Ht 71.0 in | Wt 160.0 lb

## 2021-01-16 DIAGNOSIS — R0789 Other chest pain: Secondary | ICD-10-CM | POA: Diagnosis not present

## 2021-01-16 DIAGNOSIS — R079 Chest pain, unspecified: Secondary | ICD-10-CM

## 2021-01-16 NOTE — Addendum Note (Signed)
Addended by: Mendel Ryder on: 01/16/2021 04:35 PM   Modules accepted: Orders

## 2021-01-16 NOTE — Progress Notes (Signed)
Chief Complaint  Patient presents with   New Patient (Initial Visit)    History of Present Illness: 55 yo female with history of diverticulosis, GERD, irritable bowel syndrome and hypothyroidism who is here today as a new patient for the evaluation of chest pain. She was admitted to [facility] with chest pain and left arm weakness in may 2022. She had a negative head CT and head MRI. Her pain resolved and she left the hospital before she could have an echo. She had recurrent chest pain yesterday with radiation into her neck and jaw for 40 minutes. She had mild chest discomfort for 4 hours. No associated dyspnea, diaphoresis or dizziness. She exercises frequently. She got stung by 14 bees in July 2022 and had trouble breathing. She was seen in the ED. She had some chest pain. Troponin negative x 2.   Primary Care Physician: Maximiano Coss, NP   Past Medical History:  Diagnosis Date   Diverticulosis 02/19/2018   By colonoscopy 2016   Fibroid    GERD (gastroesophageal reflux disease)    Hx of concussion    IBS (irritable bowel syndrome)    Iron excess 2022   Kidney disease    Stage III--due to medication dexilant   Leukopenia 2022   Prediabetes    Thyroid disease     Past Surgical History:  Procedure Laterality Date   COLONOSCOPY  2016   ENDOMETRIAL ABLATION     SHOULDER SURGERY Left    TUBAL LIGATION     UPPER GASTROINTESTINAL ENDOSCOPY  11/10/2019    Current Outpatient Medications  Medication Sig Dispense Refill   EPINEPHrine 0.3 mg/0.3 mL IJ SOAJ injection Inject 0.3 mg into the muscle as needed for anaphylaxis. 2 each 0   famotidine (PEPCID) 20 MG tablet Take 1 tablet (20 mg total) by mouth 2 (two) times daily. 30 tablet 0   hydrOXYzine (ATARAX/VISTARIL) 10 MG tablet Take 0.5-1 tablets (5-10 mg total) by mouth at bedtime as needed (sleep). 90 tablet 3   levothyroxine (SYNTHROID) 75 MCG tablet Take 1 tablet (75 mcg total) by mouth daily. 90 tablet 3   loratadine (CLARITIN) 10 MG  tablet Take 10 mg by mouth daily.     methocarbamol (ROBAXIN) 500 MG tablet Take 1 tablet (500 mg total) by mouth every 6 (six) hours as needed for muscle spasms. 60 tablet 0   polyethylene glycol (MIRALAX) 17 g packet Take 17 g by mouth every other day. 14 each 0   sucralfate (CARAFATE) 1 g tablet Take 1 tablet (1 g total) by mouth every 6 (six) hours as needed. Slowy dissolve 1 tablet in 1 Tablespoon of distilled water to make a slurry before ingesting 90 tablet 2   venlafaxine XR (EFFEXOR XR) 37.5 MG 24 hr capsule Take 1 capsule (37.5 mg total) by mouth daily. 90 capsule 1   vitamin B-12 (CYANOCOBALAMIN) 1000 MCG tablet Take 1,000 mcg by mouth daily.     vitamin k 100 MCG tablet Take 100 mcg by mouth daily.     Current Facility-Administered Medications  Medication Dose Route Frequency Provider Last Rate Last Admin   0.9 %  sodium chloride infusion  500 mL Intravenous Once Armbruster, Carlota Raspberry, MD       dextrose 5 % solution   Intravenous Continuous Armbruster, Carlota Raspberry, MD        Allergies  Allergen Reactions   Flagyl [Metronidazole] Anaphylaxis   Iodine Anaphylaxis   Tramadol Hcl Other (See Comments)    Showed signs  of stroke   Gluten Meal    Lactose Intolerance (Gi)     Social History   Socioeconomic History   Marital status: Married    Spouse name: Not on file   Number of children: 2   Years of education: Not on file   Highest education level: Not on file  Occupational History   Occupation: Neurosurgeon  Tobacco Use   Smoking status: Never   Smokeless tobacco: Never  Vaping Use   Vaping Use: Never used  Substance and Sexual Activity   Alcohol use: Yes    Alcohol/week: 2.0 standard drinks    Types: 2 Standard drinks or equivalent per week    Comment: occasionally  per week   Drug use: Never   Sexual activity: Yes    Birth control/protection: Surgical    Comment: Ablation  Other Topics Concern   Not on file  Social History Narrative   Not on file   Social  Determinants of Health   Financial Resource Strain: Not on file  Food Insecurity: Not on file  Transportation Needs: Not on file  Physical Activity: Not on file  Stress: Not on file  Social Connections: Not on file  Intimate Partner Violence: Not on file    Family History  Problem Relation Age of Onset   Testicular cancer Brother    Esophageal cancer Brother    Healthy Daughter    Healthy Son    Hypotension Maternal Grandmother    Aortic dissection Maternal Grandmother    Heart disease Maternal Grandfather    Diabetes Paternal Grandmother        AODM   Diabetes Paternal Grandfather        AODM   Ovarian cancer Paternal Great-grandmother     Review of Systems:  As stated in the HPI and otherwise negative.   BP 120/90   Pulse 69   Ht 5' 11"  (1.803 m)   Wt 160 lb (72.6 kg)   SpO2 98%   BMI 22.32 kg/m   Physical Examination: General: Well developed, well nourished, NAD  HEENT: OP clear, mucus membranes moist  SKIN: warm, dry. No rashes. Neuro: No focal deficits  Musculoskeletal: Muscle strength 5/5 all ext  Psychiatric: Mood and affect normal  Neck: No JVD, no carotid bruits, no thyromegaly, no lymphadenopathy.  Lungs:Clear bilaterally, no wheezes, rhonci, crackles Cardiovascular: Regular rate and rhythm. No murmurs, gallops or rubs. Abdomen:Soft. Bowel sounds present. Non-tender.  Extremities: No lower extremity edema. Pulses are 2 + in the bilateral DP/PT.  EKG:  EKG is ordered today. The ekg ordered today demonstrates sinus  Recent Labs: 09/12/2020: TSH 2.70 10/20/2020: ALT 29 12/18/2020: BUN 15; Creatinine, Ser 1.23; Hemoglobin 14.0; Platelets 263; Potassium 3.8; Sodium 138   Lipid Panel    Component Value Date/Time   CHOL 179 10/21/2020 0144   TRIG 77 10/21/2020 0144   HDL 63 10/21/2020 0144   CHOLHDL 2.8 10/21/2020 0144   VLDL 15 10/21/2020 0144   LDLCALC 101 (H) 10/21/2020 0144     Wt Readings from Last 3 Encounters:  01/16/21 160 lb (72.6 kg)   12/18/20 160 lb (72.6 kg)  10/23/20 161 lb 3.2 oz (73.1 kg)     Assessment and Plan:   1. Chest pain: Her pain has mostly atypical features but has occurred multiple times. Will arrange an echo to assess LVEF and exclude structural heart disease. Exercise nuclear stress test to exclude ischemia.   Current medicines are reviewed at length with the patient today.  The patient does not have concerns regarding medicines.  The following changes have been made:  no change  Labs/ tests ordered today include:   Orders Placed This Encounter  Procedures   Myocardial Perfusion Imaging   EKG 12-Lead   ECHOCARDIOGRAM COMPLETE    Disposition:   F/U with me as needed. I will call her with the results of her tests.    Signed, Lauree Chandler, MD 01/16/2021 4:30 PM    [address] [place], [place], [place]  [address] Phone: [phone]; Fax: [phone]

## 2021-01-16 NOTE — Patient Instructions (Signed)
Medication Instructions:  Your physician recommends that you continue on your current medications as directed. Please refer to the Current Medication list given to you today.  *If you need a refill on your cardiac medications before your next appointment, please call your pharmacy*   Lab Work: None ordered   If you have labs (blood work) drawn today and your tests are completely normal, you will receive your results only by: [facility] (if you have MyChart) OR A paper copy in the mail If you have any lab test that is abnormal or we need to change your treatment, we will call you to review the results.   Testing/Procedures: Your physician has requested that you have an echocardiogram. Echocardiography is a painless test that uses sound waves to create images of your heart. It provides your doctor with information about the size and shape of your heart and how well your heart's chambers and valves are working. This procedure takes approximately one hour. There are no restrictions for this procedure.  Your physician has requested that you have an exercise stress myoview. For further information please visit [email or web address]. Please follow instruction sheet, as given.   Follow-Up: Follow up as needed    Other Instructions None ordered

## 2021-01-17 ENCOUNTER — Encounter: Payer: Self-pay | Admitting: Obstetrics and Gynecology

## 2021-01-17 ENCOUNTER — Encounter: Payer: Self-pay | Admitting: Registered Nurse

## 2021-01-23 NOTE — Addendum Note (Signed)
Addended by: Lauree Chandler D on: 01/23/2021 09:14 AM   Modules accepted: Orders

## 2021-01-24 ENCOUNTER — Other Ambulatory Visit: Payer: Self-pay

## 2021-01-24 ENCOUNTER — Encounter: Payer: Self-pay | Admitting: Obstetrics and Gynecology

## 2021-01-24 ENCOUNTER — Encounter (HOSPITAL_COMMUNITY): Payer: Self-pay | Admitting: *Deleted

## 2021-01-24 ENCOUNTER — Telehealth (HOSPITAL_COMMUNITY): Payer: Self-pay | Admitting: *Deleted

## 2021-01-24 ENCOUNTER — Ambulatory Visit (INDEPENDENT_AMBULATORY_CARE_PROVIDER_SITE_OTHER): Payer: 59 | Admitting: Obstetrics and Gynecology

## 2021-01-24 VITALS — BP 140/80 | HR 75 | Ht 70.5 in | Wt 158.0 lb

## 2021-01-24 DIAGNOSIS — E039 Hypothyroidism, unspecified: Secondary | ICD-10-CM

## 2021-01-24 DIAGNOSIS — N951 Menopausal and female climacteric states: Secondary | ICD-10-CM

## 2021-01-24 DIAGNOSIS — R635 Abnormal weight gain: Secondary | ICD-10-CM

## 2021-01-24 DIAGNOSIS — R4586 Emotional lability: Secondary | ICD-10-CM | POA: Diagnosis not present

## 2021-01-24 MED ORDER — VENLAFAXINE HCL ER 75 MG PO CP24: 75.0000 mg | ORAL_CAPSULE | Freq: Every day | ORAL | 2 refills | Status: DC

## 2021-01-24 NOTE — Progress Notes (Signed)
GYNECOLOGY  VISIT   HPI: 55 y.o.   Married  Caucasian  female   G2P2 with No LMP recorded. Patient has had an ablation.   here for hot flashes, irritability, weight gain and "sluggish"/fatigue feeling.  She is having mood swings.  She is fasting to not gain weight. Decreased energy and motivation.  HA for 2 days.  She started Effexor for menopausal symptoms and stress.  She was doing well initially.   Prometrium causes cystic acne.  She had hair loss with Estrace and Prometrium.  She has hypothyroidism.  Husband had cardiac surgery.  Five days later patient herself was evaluated in hospital for a potential stroke.  She did not have a stroke but had a migraine. She left the hospital AMA in order to care for her husband.  She did see her cardiologist on 01/16/21. Will have an ECHO next week.   Also had several bee stings on 12/18/20.  Nutrition counseling not covered by her insurance.   She feels stretched thin.  Not feeling good if she cannot work out.  Her energy level is low.  Not sleeping well.   She denies suicidal ideation.   GYNECOLOGIC HISTORY: No LMP recorded. Patient has had an ablation. Contraception:  Tubal/Ablation Menopausal hormone therapy:   Last mammogram: 09-24-19 Neg/density C/Birads1      Last pap smear:  08-23-19 Neg:Neg HR HPV--no hx of abnormal paps        OB History     Gravida  2   Para  2   Term      Preterm      AB      Living  2      SAB      IAB      Ectopic      Multiple      Live Births                 Patient Active Problem List   Diagnosis Date Noted   TIA (transient ischemic attack) 10/20/2020   Postmenopausal 11/09/2019   Acquired hypothyroidism 11/09/2019   Weight loss 11/09/2019   Hypothyroidism (acquired) 08/20/2019   On hormone replacement therapy 08/20/2019   Visit for preventive health examination 08/20/2019   Encounter for screening mammogram for malignant neoplasm of breast 08/20/2019   History of  endometrial cancer 02/19/2018   GERD (gastroesophageal reflux disease) 02/19/2018   IBS (irritable bowel syndrome) 02/19/2018   Diverticulosis 02/19/2018    Past Medical History:  Diagnosis Date   Diverticulosis 02/19/2018   By colonoscopy 2016   Fibroid    GERD (gastroesophageal reflux disease)    Hx of concussion    IBS (irritable bowel syndrome)    Iron excess 2022   Kidney disease    Stage III--due to medication dexilant   Leukopenia 2022   Prediabetes    Thyroid disease     Past Surgical History:  Procedure Laterality Date   COLONOSCOPY  2016   ENDOMETRIAL ABLATION     SHOULDER SURGERY Left    TUBAL LIGATION     UPPER GASTROINTESTINAL ENDOSCOPY  11/10/2019    Current Outpatient Medications  Medication Sig Dispense Refill   EPINEPHrine 0.3 mg/0.3 mL IJ SOAJ injection Inject 0.3 mg into the muscle as needed for anaphylaxis. 2 each 0   famotidine (PEPCID) 20 MG tablet Take 1 tablet (20 mg total) by mouth 2 (two) times daily. 30 tablet 0   hydrOXYzine (ATARAX/VISTARIL) 10 MG tablet Take 0.5-1 tablets (5-10 mg  total) by mouth at bedtime as needed (sleep). 90 tablet 3   levothyroxine (SYNTHROID) 75 MCG tablet Take 1 tablet (75 mcg total) by mouth daily. 90 tablet 3   loratadine (CLARITIN) 10 MG tablet Take 10 mg by mouth daily.     methocarbamol (ROBAXIN) 500 MG tablet Take 1 tablet (500 mg total) by mouth every 6 (six) hours as needed for muscle spasms. 60 tablet 0   polyethylene glycol (MIRALAX) 17 g packet Take 17 g by mouth every other day. 14 each 0   sucralfate (CARAFATE) 1 g tablet Take 1 tablet (1 g total) by mouth every 6 (six) hours as needed. Slowy dissolve 1 tablet in 1 Tablespoon of distilled water to make a slurry before ingesting 90 tablet 2   venlafaxine XR (EFFEXOR XR) 37.5 MG 24 hr capsule Take 1 capsule (37.5 mg total) by mouth daily. 90 capsule 1   vitamin B-12 (CYANOCOBALAMIN) 1000 MCG tablet Take 1,000 mcg by mouth daily.     vitamin k 100 MCG tablet Take  100 mcg by mouth daily.     Current Facility-Administered Medications  Medication Dose Route Frequency Provider Last Rate Last Admin   0.9 %  sodium chloride infusion  500 mL Intravenous Once Armbruster, Carlota Raspberry, MD       dextrose 5 % solution   Intravenous Continuous Armbruster, Carlota Raspberry, MD         ALLERGIES: Bee venom, Flagyl [metronidazole], Iodine, Shellfish allergy, Tramadol hcl, Gluten meal, and Lactose intolerance (gi)  Family History  Problem Relation Age of Onset   Testicular cancer Brother    Esophageal cancer Brother    Healthy Daughter    Healthy Son    Hypotension Maternal Grandmother    Aortic dissection Maternal Grandmother    Heart disease Maternal Grandfather    Diabetes Paternal Grandmother        AODM   Diabetes Paternal Grandfather        AODM   Ovarian cancer Paternal Great-grandmother     Social History   Socioeconomic History   Marital status: Married    Spouse name: Not on file   Number of children: 2   Years of education: Not on file   Highest education level: Not on file  Occupational History   Occupation: Neurosurgeon  Tobacco Use   Smoking status: Never   Smokeless tobacco: Never  Vaping Use   Vaping Use: Never used  Substance and Sexual Activity   Alcohol use: Yes    Alcohol/week: 2.0 standard drinks    Types: 2 Standard drinks or equivalent per week    Comment: occasionally  per week   Drug use: Never   Sexual activity: Yes    Birth control/protection: Surgical    Comment: Ablation  Other Topics Concern   Not on file  Social History Narrative   Not on file   Social Determinants of Health   Financial Resource Strain: Not on file  Food Insecurity: Not on file  Transportation Needs: Not on file  Physical Activity: Not on file  Stress: Not on file  Social Connections: Not on file  Intimate Partner Violence: Not on file    Review of Systems  Constitutional:  Positive for fatigue and unexpected weight change (weight gain).   Psychiatric/Behavioral:  Positive for agitation.    PHYSICAL EXAMINATION:    BP 140/80   Pulse 75   Ht 5' 10.5" (1.791 m)   Wt 158 lb (71.7 kg)   SpO2 100%  BMI 22.35 kg/m     General appearance: alert, cooperative and appears stated age                 ASSESSMENT  Menopausal symptoms.  Mood swings.  Life stress. Hypothyroidism. Weight gain.   PLAN  Increased Effexor to 75 mg daily.  #30, RF 2.  Check TFTs.  Declines life coach for now.  Will consider referral to psychiatry for prescribing expertise if increased dose of Effexor not helpful. Will refer patient to Weight management group. Annual exam in 3 months.    An After Visit Summary was printed and given to the patient.  33 min  total time was spent for this patient encounter, including preparation, face-to-face counseling with the patient, coordination of care, and documentation of the encounter.

## 2021-01-24 NOTE — Telephone Encounter (Signed)
Left message on voicemail per DPR in reference to upcoming appointment scheduled on 01/31/21 at 0715 with detailed instructions given per Myocardial Perfusion Study Information Sheet for the test. LM to arrive 15 minutes early, and that it is imperative to arrive on time for appointment to keep from having the test rescheduled. If you need to cancel or reschedule your appointment, please call the office within 24 hours of your appointment. Failure to do so may result in a cancellation of your appointment, and a $50 no show fee. Phone number given for call back for any questions. [place] Mychart letter sent

## 2021-01-25 LAB — TSH: TSH: 1.28 mIU/L (ref 0.40–4.50)

## 2021-01-25 LAB — T4, FREE: Free T4: 1.1 ng/dL (ref 0.8–1.8)

## 2021-01-31 ENCOUNTER — Ambulatory Visit (HOSPITAL_COMMUNITY): Payer: 59 | Attending: Cardiology

## 2021-01-31 ENCOUNTER — Other Ambulatory Visit: Payer: Self-pay

## 2021-01-31 ENCOUNTER — Ambulatory Visit (HOSPITAL_BASED_OUTPATIENT_CLINIC_OR_DEPARTMENT_OTHER): Payer: 59

## 2021-01-31 DIAGNOSIS — R0789 Other chest pain: Secondary | ICD-10-CM | POA: Insufficient documentation

## 2021-01-31 DIAGNOSIS — R079 Chest pain, unspecified: Secondary | ICD-10-CM | POA: Insufficient documentation

## 2021-01-31 LAB — MYOCARDIAL PERFUSION IMAGING
Estimated workload: 10.1 METS
Exercise duration (min): 9 min
Exercise duration (sec): 0 s
LV dias vol: 54 mL (ref 46–106)
LV sys vol: 19 mL
MPHR: 164 {beats}/min
Peak HR: 169 {beats}/min
Percent HR: 103 %
Rest HR: 70 {beats}/min
SDS: 2
SRS: 0
SSS: 2
TID: 0.92

## 2021-01-31 LAB — ECHOCARDIOGRAM COMPLETE
Area-P 1/2: 3.13 cm2
Height: 71 in
S' Lateral: 2.6 cm
Weight: 2560 oz

## 2021-01-31 MED ORDER — TECHNETIUM TC 99M TETROFOSMIN IV KIT
10.7000 | PACK | Freq: Once | INTRAVENOUS | Status: AC | PRN
  Administered 2021-01-31: 10.7 via INTRAVENOUS
  Filled 2021-01-31: qty 11

## 2021-01-31 MED ORDER — TECHNETIUM TC 99M TETROFOSMIN IV KIT
31.1000 | PACK | Freq: Once | INTRAVENOUS | Status: AC | PRN
Start: 2021-01-31 — End: 2021-01-31
  Administered 2021-01-31: 31.1 via INTRAVENOUS
  Filled 2021-01-31: qty 32

## 2021-02-08 ENCOUNTER — Ambulatory Visit: Payer: 59 | Admitting: Cardiovascular Disease

## 2021-02-09 NOTE — Progress Notes (Signed)
Pt has been made aware of normal result and verbalized understanding.  jw

## 2021-02-14 ENCOUNTER — Encounter: Payer: Self-pay | Admitting: Physician Assistant

## 2021-02-14 ENCOUNTER — Ambulatory Visit (INDEPENDENT_AMBULATORY_CARE_PROVIDER_SITE_OTHER): Payer: 59 | Admitting: Physician Assistant

## 2021-02-14 VITALS — BP 124/84 | HR 79 | Ht 71.0 in | Wt 160.0 lb

## 2021-02-14 DIAGNOSIS — Z800 Family history of malignant neoplasm of digestive organs: Secondary | ICD-10-CM | POA: Diagnosis not present

## 2021-02-14 DIAGNOSIS — R0789 Other chest pain: Secondary | ICD-10-CM | POA: Diagnosis not present

## 2021-02-14 DIAGNOSIS — K219 Gastro-esophageal reflux disease without esophagitis: Secondary | ICD-10-CM | POA: Diagnosis not present

## 2021-02-14 MED ORDER — LIDOCAINE VISCOUS HCL 2 % MT SOLN: OROMUCOSAL | 1 refills | Status: DC

## 2021-02-14 MED ORDER — FAMOTIDINE 40 MG PO TABS: 40.0000 mg | ORAL_TABLET | Freq: Two times a day (BID) | ORAL | 5 refills | Status: DC

## 2021-02-14 NOTE — Patient Instructions (Addendum)
We have sent the following medications to your pharmacy for you to pick up at your convenience: Pepcid 40 mg twice daily.  GI cocktail 5-10 ml every 4-6 hours as needed.  Schedule Carafate four times daily 1 hour before or 2 hours after meals and at bedtime.   If you are age 56 or older, your body mass index should be between 23-30. Your Body mass index is 22.32 kg/m. If this is out of the aforementioned range listed, please consider follow up with your Primary Care Provider.  If you are age 80 or younger, your body mass index should be between 19-25. Your Body mass index is 22.32 kg/m. If this is out of the aformentioned range listed, please consider follow up with your Primary Care Provider.   __________________________________________________________  The [facility] providers would like to encourage you to use [facility] to communicate with providers for non-urgent requests or questions.  Due to long hold times on the telephone, sending your provider a message by [facility] may be a faster and more efficient way to get a response.  Please allow 48 business hours for a response.  Please remember that this is for non-urgent requests.

## 2021-02-14 NOTE — Progress Notes (Signed)
Chief Complaint: GERD/atypical chest pain  HPI:    Candace Allen is a 55 year old female with a past medical history of soft cancer, GERD, diverticulosis, IBS and others listed below, known to Dr. Havery Moros, who presents to clinic today with a complaint of GERD/atypical chest pain.    07/05/2013 colonoscopy with moderate diverticulosis in the sigmoid colon and no polyps.  Recall placed for 04/2024.    11/02/2019 office visit Dr. Havery Moros.  At that time discussed longstanding symptoms of GERD with pyrosis frequently after she ate.  A trial of Omeprazole in the past did cause throat closing.  Had also tried Nexium and Dexilant in the past, however she developed stage III CKD and blamed this on the Dexilant use.  Also discussed constipation.  At that time since she wanted to avoid PPIs her Pepcid was increased to twice a day and liquid Carafate was added.  Discussed at that time that ultimately her best choice may be an endoscopic TIF or Nissen.  EGD was scheduled.  She reported a recent colonoscopy in 2016 and records were requested.  At that time recommended low-dose MiraLAX perhaps 1 dose every other day.    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  Discussed that patient had Hill grade 4 views of the cardia was not thought she would be a candidate for TIF and would consider surgical evaluation but ordered a gastric emptying study first.    11/25/2019 gastric emptying study was normal.  At that time discussed that she continues significant reflux symptoms then would recommend surgical referral.    01/16/2021 patient saw cardiology in regards to some chest pain.  They recommended an echo and nuclear stress test.    01/31/2021 echo was normal and stress test normal.    Today, the patient presents to clinic and explains that she had an episode of chest pain/pressure which  resulted in arm numbness and she ended up in the ER initially back in May, they kept her in the hospital due to her chest pain and worked her up but everything was normal.  She then had another episode of severe chest pain after a bee sting in July and has had several episodes since then.  She has followed with cardiology as above with normal echo and stress testing.  It is thought that possibly her reflux symptoms are overlapping and causing these issues.  She tells me that when she gets the chest pain/ pressure it is like someone is "squeezing as tight as they can" and it is hard for her to take a breath, it seems to come on "whenever it would like to".  Not necessarily associated with eating or activity.  Tells me it can hurt for 2 to 3 hours and eventually it will get to the level where she can "get busy" and I "do not think about it".  Tells me she has never tried to take any medication when these episodes come on.  Currently she is using her Pepcid 20 mg twice a day and Carafate as needed which is not even once a week.  Tells me that she has continued reflux symptoms as well.  Again worried due to her family history of esophageal cancer.    Denies fever, chills, weight loss, change in bowel habits or symptoms that awaken her from sleep.  Past Medical History:  Diagnosis Date   Diverticulosis 02/19/2018  By colonoscopy 2016   Fibroid    GERD (gastroesophageal reflux disease)    Hx of concussion    IBS (irritable bowel syndrome)    Iron excess 2022   Kidney disease    Stage III--due to medication dexilant   Leukopenia 2022   Prediabetes    Thyroid disease     Past Surgical History:  Procedure Laterality Date   COLONOSCOPY  2016   ENDOMETRIAL ABLATION     SHOULDER SURGERY Left    TUBAL LIGATION     UPPER GASTROINTESTINAL ENDOSCOPY  11/10/2019    Current Outpatient Medications  Medication Sig Dispense Refill   EPINEPHrine 0.3 mg/0.3 mL IJ SOAJ injection Inject 0.3 mg into the muscle as  needed for anaphylaxis. 2 each 0   famotidine (PEPCID) 20 MG tablet Take 1 tablet (20 mg total) by mouth 2 (two) times daily. 30 tablet 0   hydrOXYzine (ATARAX/VISTARIL) 10 MG tablet Take 0.5-1 tablets (5-10 mg total) by mouth at bedtime as needed (sleep). 90 tablet 3   levothyroxine (SYNTHROID) 75 MCG tablet Take 1 tablet (75 mcg total) by mouth daily. 90 tablet 3   loratadine (CLARITIN) 10 MG tablet Take 10 mg by mouth daily.     methocarbamol (ROBAXIN) 500 MG tablet Take 1 tablet (500 mg total) by mouth every 6 (six) hours as needed for muscle spasms. 60 tablet 0   polyethylene glycol (MIRALAX) 17 g packet Take 17 g by mouth every other day. 14 each 0   sucralfate (CARAFATE) 1 g tablet Take 1 tablet (1 g total) by mouth every 6 (six) hours as needed. Slowy dissolve 1 tablet in 1 Tablespoon of distilled water to make a slurry before ingesting 90 tablet 2   venlafaxine XR (EFFEXOR XR) 75 MG 24 hr capsule Take 1 capsule (75 mg total) by mouth daily with breakfast. 30 capsule 2   vitamin B-12 (CYANOCOBALAMIN) 1000 MCG tablet Take 1,000 mcg by mouth daily.     vitamin k 100 MCG tablet Take 100 mcg by mouth daily.     Current Facility-Administered Medications  Medication Dose Route Frequency Provider Last Rate Last Admin   0.9 %  sodium chloride infusion  500 mL Intravenous Once Armbruster, Carlota Raspberry, MD       dextrose 5 % solution   Intravenous Continuous Armbruster, Carlota Raspberry, MD        Allergies as of 02/14/2021 - Review Complete 02/14/2021  Allergen Reaction Noted   Bee venom Anaphylaxis 01/24/2021   Flagyl [metronidazole] Anaphylaxis 02/19/2018   Iodine Anaphylaxis 02/19/2018   Shellfish allergy Anaphylaxis 01/24/2021   Tramadol hcl Other (See Comments) 02/24/2018   Gluten meal  09/12/2020   Lactose intolerance (gi)  07/05/2020    Family History  Problem Relation Age of Onset   Testicular cancer Brother    Esophageal cancer Brother    Healthy Daughter    Healthy Son    Hypotension  Maternal Grandmother    Aortic dissection Maternal Grandmother    Heart disease Maternal Grandfather    Diabetes Paternal Grandmother        AODM   Diabetes Paternal Grandfather        AODM   Ovarian cancer Paternal Great-grandmother     Social History   Socioeconomic History   Marital status: Married    Spouse name: Not on file   Number of children: 2   Years of education: Not on file   Highest education level: Not on file  Occupational History  Occupation: Neurosurgeon  Tobacco Use   Smoking status: Never    Passive exposure: Never   Smokeless tobacco: Never  Vaping Use   Vaping Use: Never used  Substance and Sexual Activity   Alcohol use: Yes    Alcohol/week: 2.0 standard drinks    Types: 2 Standard drinks or equivalent per week   Drug use: Never   Sexual activity: Yes    Birth control/protection: Surgical    Comment: Ablation  Other Topics Concern   Not on file  Social History Narrative   Not on file   Social Determinants of Health   Financial Resource Strain: Not on file  Food Insecurity: Not on file  Transportation Needs: Not on file  Physical Activity: Not on file  Stress: Not on file  Social Connections: Not on file  Intimate Partner Violence: Not on file    Review of Systems:    Constitutional: No weight loss, fever or chills Cardiovascular: +chest pain and presure Respiratory: No SOB  Gastrointestinal: See HPI and otherwise negative   Physical Exam:  Vital signs: BP 124/84   Pulse 79   Ht 5' 11"  (1.803 m)   Wt 160 lb (72.6 kg)   SpO2 98%   BMI 22.32 kg/m    Constitutional:   Pleasant Caucasian female appears to be in NAD, Well developed, Well nourished, alert and cooperative Respiratory: Respirations even and unlabored. Lungs clear to auscultation bilaterally.   No wheezes, crackles, or rhonchi.  Cardiovascular: Normal S1, S2. No MRG. Regular rate and rhythm. No peripheral edema, cyanosis or pallor.  Gastrointestinal:  Soft, nondistended,  mild epigastric ttp, No rebound or guarding. Normal bowel sounds. No appreciable masses or hepatomegaly. Rectal:  Not performed.  Psychiatric: Oriented to person, place and time. Demonstrates good judgement and reason without abnormal affect or behaviors.  RELEVANT LABS AND IMAGING: CBC    Component Value Date/Time   WBC 5.9 12/18/2020 1315   RBC 4.46 12/18/2020 1315   HGB 14.0 12/18/2020 1315   HCT 41.3 12/18/2020 1315   PLT 263 12/18/2020 1315   MCV 92.6 12/18/2020 1315   MCH 31.4 12/18/2020 1315   MCHC 33.9 12/18/2020 1315   RDW 11.9 12/18/2020 1315   LYMPHSABS 2.6 10/20/2020 1324   MONOABS 0.4 10/20/2020 1324   EOSABS 0.1 10/20/2020 1324   BASOSABS 0.1 10/20/2020 1324    CMP     Component Value Date/Time   NA 138 12/18/2020 1315   NA 136 (A) 12/27/2015 0000   K 3.8 12/18/2020 1315   CL 102 12/18/2020 1315   CO2 24 12/18/2020 1315   GLUCOSE 130 (H) 12/18/2020 1315   BUN 15 12/18/2020 1315   BUN 15 12/27/2015 0000   CREATININE 1.23 (H) 12/18/2020 1315   CREATININE 1.10 (H) 09/12/2020 0855   CALCIUM 10.1 12/18/2020 1315   PROT 7.0 10/20/2020 1324   ALBUMIN 4.5 10/20/2020 1324   AST 31 10/20/2020 1324   ALT 29 10/20/2020 1324   ALKPHOS 64 10/20/2020 1324   BILITOT 0.4 10/20/2020 1324   GFRNONAA 52 (L) 12/18/2020 1315   GFRAA >60 02/20/2018 2002    Assessment: 1.  Atypical chest pain: Work-up by cardiology negative so far negative stress test and echo/EKG, they are thinking possibly her reflux is contributing; consider reflux versus anxiety versus other 2.  GERD: Continues, patient with EGD in May with hiatal hernia and excess gastric fluid, gastric emptying study negative, patient unable to take PPIs due to kidney disease  Plan: 1.  Discussed  with patient that the symptoms could be related to her reflux or possibly a panic attack/anxiety (though patient does not express this to me).  It is reassuring that her cardiac work-up has been negative so far, but they may  want her to wear a Holter monitor etc. she is supposed to continue to follow-up with them. 2.  At this point we will maximize her reflux therapy.  She is unable to take PPIs as it is thought that Dexilant caused her kidney disease in the past.  We will increase her Pepcid to 40 mg every morning and nightly.  Prescribed #60 with 5 refills. 3.  Recommend patient take her Carafate on a daily basis up to 4 times a day if she is able.  She should take this an hour before or 2 hours after eating and other medications. 4.  Also prescribed GI cocktail 5-10 mL every 4-6 hours as needed for chest pain/breakthrough symptoms.  Discussed that she may want to carry this with her and tried drinking some the next time she has problems to see if it is helpful as this will help [place] narrow it down. 5.  Reassured the patient that she just had a normal EGD in May of last year, likely every 2 years is frequent enough unless there is an abrupt change in symptoms. 6.  Patient will be in touch with [place] to let us know how she is doing.  Otherwise scheduled her for a follow-up office visit with Dr. Havery Moros in 2-3 months.  Ellouise Newer, PA-C [facility] 02/14/2021, 9:12 AM  Cc: Maximiano Coss, NP

## 2021-02-14 NOTE — Progress Notes (Signed)
Agree with assessment and plan as outlined.  

## 2021-02-28 ENCOUNTER — Ambulatory Visit: Payer: 59 | Admitting: Cardiovascular Disease

## 2021-03-14 ENCOUNTER — Encounter: Payer: Self-pay | Admitting: Registered Nurse

## 2021-03-16 ENCOUNTER — Other Ambulatory Visit: Payer: Self-pay

## 2021-03-16 ENCOUNTER — Encounter: Payer: Self-pay | Admitting: Registered Nurse

## 2021-03-16 ENCOUNTER — Encounter: Payer: Self-pay | Admitting: Internal Medicine

## 2021-03-16 ENCOUNTER — Ambulatory Visit (INDEPENDENT_AMBULATORY_CARE_PROVIDER_SITE_OTHER): Payer: 59 | Admitting: Registered Nurse

## 2021-03-16 ENCOUNTER — Ambulatory Visit (INDEPENDENT_AMBULATORY_CARE_PROVIDER_SITE_OTHER): Payer: 59 | Admitting: Internal Medicine

## 2021-03-16 VITALS — BP 131/90 | HR 74 | Temp 98.0°F | Resp 18 | Ht 71.0 in | Wt 163.6 lb

## 2021-03-16 VITALS — BP 118/74 | HR 84 | Ht 71.0 in | Wt 164.6 lb

## 2021-03-16 DIAGNOSIS — Z0000 Encounter for general adult medical examination without abnormal findings: Secondary | ICD-10-CM

## 2021-03-16 DIAGNOSIS — E039 Hypothyroidism, unspecified: Secondary | ICD-10-CM

## 2021-03-16 DIAGNOSIS — K449 Diaphragmatic hernia without obstruction or gangrene: Secondary | ICD-10-CM

## 2021-03-16 DIAGNOSIS — G479 Sleep disorder, unspecified: Secondary | ICD-10-CM | POA: Diagnosis not present

## 2021-03-16 DIAGNOSIS — R5382 Chronic fatigue, unspecified: Secondary | ICD-10-CM

## 2021-03-16 MED ORDER — LIOTHYRONINE SODIUM 5 MCG PO TABS: 5.0000 ug | ORAL_TABLET | Freq: Every day | ORAL | 3 refills | Status: DC

## 2021-03-16 MED ORDER — TRAZODONE HCL 50 MG PO TABS: 25.0000 mg | ORAL_TABLET | Freq: Every evening | ORAL | 3 refills | Status: DC | PRN

## 2021-03-16 NOTE — Progress Notes (Signed)
Name: Candace Allen  MRN/ DOB: [id number], 09-23-1965    Age/ Sex: 55 y.o., female     PCP: Maximiano Coss, NP   Reason for Endocrinology Evaluation: Hypothyroidism     Initial Endocrinology Clinic Visit: 11/09/2019    PATIENT IDENTIFIER: Candace Allen is a 55 y.o., female with a past medical history of IBS. She has followed with [facility] since 11/09/2019 for consultative assistance with management of her hypothyroidism.   HISTORICAL SUMMARY:   Pt has been diagnosed with hypothyroidism through integrative health specialist back in 11/2018 She presented there with hot flashes, weight gain , irritability and mood swings as well as fatigue and tiredness. She was started on armour thyroid and has been on this dose ever since. She was also started on Trulicity as well as a hormonal cocktail that includes testosterone.   She was put on  hip pellets (?estrogen and testosterone ) In 06/2019 that would last for ~ 3 month  She is also on Progesterone 100 mg daily    She does have hx of endometrial ablation, pt tells me they found tumors?  No FH of breast cancer or ovarian cancer      Pap smear negative 08/23/2019 Mammo negative 09/24/2019   On her initial visit to our clinic , we continued armour thyroid   This was switched to Levothyroxine 05/2020 due to insurance coverage   As for her HRT , this was not refilled, pt has a diagnosis of endometrial cancer in her chart and was advised to obtain records. She was also advised to stop progesterone. As she had already stopped hormonal pellets Pt was also advised to stop trulicity as she had no diagnosis of DM nor obesity ( BMI was 22.67 .  SUBJECTIVE:    Today (03/16/2021):  Candace Allen is here for a follow up on hypothyroidism.   She saw Gyn for hot flashes, weight gain and fatigue . She is  Effexor for menopausal symptoms With some  improvement.   She was referred for weight management.  Saw cardiology 01/2021 after an ED  visit for suspected Stroke, determined to be migraine.  Echo and stress normal  Has occasional Diarrhea due to IBS  No local neck symptoms    Levothyroxine 75 mcg daily     HISTORY:  Past Medical History:  Past Medical History:  Diagnosis Date   Diverticulosis 02/19/2018   By colonoscopy 2016   Fibroid    GERD (gastroesophageal reflux disease)    Hx of concussion    IBS (irritable bowel syndrome)    Iron excess 2022   Kidney disease    Stage III--due to medication dexilant   Leukopenia 2022   Prediabetes    Thyroid disease    Past Surgical History:  Past Surgical History:  Procedure Laterality Date   COLONOSCOPY  2016   ENDOMETRIAL ABLATION     SHOULDER SURGERY Left    TUBAL LIGATION     UPPER GASTROINTESTINAL ENDOSCOPY  11/10/2019   Social History:  reports that she has never smoked. She has never been exposed to tobacco smoke. She has never used smokeless tobacco. She reports current alcohol use of about 2.0 standard drinks per week. She reports that she does not use drugs. Family History:  Family History  Problem Relation Age of Onset   Testicular cancer Brother    Esophageal cancer Brother    Healthy Daughter    Healthy Son    Hypotension Maternal Grandmother  Aortic dissection Maternal Grandmother    Heart disease Maternal Grandfather    Diabetes Paternal Grandmother        AODM   Diabetes Paternal Grandfather        AODM   Ovarian cancer Paternal Great-grandmother      HOME MEDICATIONS: Allergies as of 03/16/2021       Reactions   Bee Venom Anaphylaxis   Flagyl [metronidazole] Anaphylaxis   Iodine Anaphylaxis   Shellfish Allergy Anaphylaxis   Tramadol Hcl Other (See Comments)   Showed signs of stroke   Gluten Meal    Lactose Intolerance (gi)         Medication List        Accurate as of March 16, 2021  8:45 AM. If you have any questions, ask your nurse or doctor.          EPINEPHrine 0.3 mg/0.3 mL Soaj injection Commonly known  as: EPI-PEN Inject 0.3 mg into the muscle as needed for anaphylaxis.   famotidine 40 MG tablet Commonly known as: Pepcid Take 1 tablet (40 mg total) by mouth 2 (two) times daily.   GI Cocktail (alum & mag hydroxide, lidocaine, dicyclomine) oral mixture 90 ml 2 % Lidocaine, 90 ml Dicyclomine 69m/5ml, 270 ml Maalox - Take 5-10 ml every 4-6 hours as needed.   hydrOXYzine 10 MG tablet Commonly known as: ATARAX/VISTARIL Take 0.5-1 tablets (5-10 mg total) by mouth at bedtime as needed (sleep).   levothyroxine 75 MCG tablet Commonly known as: SYNTHROID Take 1 tablet (75 mcg total) by mouth daily.   liothyronine 5 MCG tablet Commonly known as: CYTOMEL Take 1 tablet (5 mcg total) by mouth daily. Started by: IDorita Sciara MD   loratadine 10 MG tablet Commonly known as: CLARITIN Take 10 mg by mouth daily.   methocarbamol 500 MG tablet Commonly known as: Robaxin Take 1 tablet (500 mg total) by mouth every 6 (six) hours as needed for muscle spasms.   polyethylene glycol 17 g packet Commonly known as: MiraLax Take 17 g by mouth every other day.   sucralfate 1 g tablet Commonly known as: Carafate Take 1 tablet (1 g total) by mouth every 6 (six) hours as needed. Slowy dissolve 1 tablet in 1 Tablespoon of distilled water to make a slurry before ingesting   venlafaxine XR 75 MG 24 hr capsule Commonly known as: Effexor XR Take 1 capsule (75 mg total) by mouth daily with breakfast.   vitamin B-12 1000 MCG tablet Commonly known as: CYANOCOBALAMIN Take 1,000 mcg by mouth daily.   vitamin k 100 MCG tablet Take 100 mcg by mouth daily.          OBJECTIVE:   PHYSICAL EXAM: VS: BP 118/74 (BP Location: Left Arm, Patient Position: Sitting, Cuff Size: Small)   Pulse 84   Ht 5' 11"  (1.803 m)   Wt 164 lb 9.6 oz (74.7 kg)   SpO2 97%   BMI 22.96 kg/m    EXAM: General: Pt appears well and is in NAD  Neck: General: Supple without adenopathy. Thyroid: Thyroid size normal.   No goiter or nodules appreciated.  Lungs: Clear with good BS bilat with no rales, rhonchi, or wheezes  Heart: Auscultation: RRR.  Abdomen: Normoactive bowel sounds, soft, nontender, without masses or organomegaly palpable  Extremities:  BL LE: No pretibial edema normal ROM and strength.  Mental Status: Judgment, insight: Intact Orientation: Oriented to time, place, and person Mood and affect: No depression, anxiety, or agitation  DATA REVIEWED:  Results for MAURIE, MUSCO (MRN [id number]) as of 03/16/2021 07:34  Ref. Range 01/24/2021 14:21  TSH Latest Ref Range: 0.40 - 4.50 mIU/L 1.28  T4,Free(Direct) Latest Ref Range: 0.8 - 1.8 ng/dL 1.1    ASSESSMENT / PLAN / RECOMMENDATIONS:   Hypothyroidism :  - She has been noted with fatigue and weight gain  - We discussed adding T3. She was on NP thyroid for a long time which contains T4 and T3 but this had to be switched to Levothyroxine in 05/2020 due to insurance issues.  - She understands that the literature has been inconclusive about the benefits of LT 3 replacement    Medications   Continue Levothyroxine 75 mcg daily  Start Liothyronine 5 mcg daily    F/U in 6 months  Labs in 8 weeks   Signed electronically by: Mack Guise, MD  [facility]  [address] [place], [place] [address] Phone: [phone] FAX: [phone]      CC: Maximiano Coss, NP [address] [place] [address] [place] [address] Phone: [phone]  Fax: [phone]   Return to Endocrinology clinic as below: Future Appointments  Date Time Provider [place]  03/16/2021 11:10 AM Maximiano Coss, NP [facility]  04/06/2021  9:20 AM Armbruster, Carlota Raspberry, MD [facility]  05/03/2021  8:30 AM Nunzio Cobbs, MD [facility] None

## 2021-03-16 NOTE — Progress Notes (Signed)
Established Patient Office Visit  Subjective:  Patient ID: Candace Allen, female    DOB: 1966/03/21  Age: 55 y.o. MRN: [id number]  CC:  Chief Complaint  Patient presents with   Annual Exam    Patient states she is her for a Physical and discuss fatigue.    HPI Candace Allen presents for CPE, labs, fatigue  Fatigue Can't get enough sleep, poor quality of sleep. Started taking effexor at night to see if that helps - has hot flashes at night that keep her awake. Gaining weight - acknowledges that she is still at a healthy weight, but not at her baseline and this is of concern to her. Diet steady. Exercise limited by fatigue.  No distinct CV symptoms. No neuro symptoms. Hot flashes worse than usual - recent increase of effexor, unsure if it has taken effect yet. Has been trying intermittent fasting to help - it has not.  Past Medical History:  Diagnosis Date   Diverticulosis 02/19/2018   By colonoscopy 2016   Fibroid    GERD (gastroesophageal reflux disease)    Hx of concussion    IBS (irritable bowel syndrome)    Iron excess 2022   Kidney disease    Stage III--due to medication dexilant   Leukopenia 2022   Prediabetes    Thyroid disease     Past Surgical History:  Procedure Laterality Date   COLONOSCOPY  2016   ENDOMETRIAL ABLATION     SHOULDER SURGERY Left    TUBAL LIGATION     UPPER GASTROINTESTINAL ENDOSCOPY  11/10/2019    Family History  Problem Relation Age of Onset   Testicular cancer Brother    Esophageal cancer Brother    Healthy Daughter    Healthy Son    Hypotension Maternal Grandmother    Aortic dissection Maternal Grandmother    Heart disease Maternal Grandfather    Diabetes Paternal Grandmother        AODM   Diabetes Paternal Grandfather        AODM   Ovarian cancer Paternal Great-grandmother     Social History   Socioeconomic History   Marital status: Married    Spouse name: Not on file   Number of children: 2   Years of education: Not  on file   Highest education level: Not on file  Occupational History   Occupation: Neurosurgeon  Tobacco Use   Smoking status: Never    Passive exposure: Never   Smokeless tobacco: Never  Vaping Use   Vaping Use: Never used  Substance and Sexual Activity   Alcohol use: Yes    Alcohol/week: 2.0 standard drinks    Types: 2 Standard drinks or equivalent per week   Drug use: Never   Sexual activity: Yes    Birth control/protection: Surgical    Comment: Ablation  Other Topics Concern   Not on file  Social History Narrative   Not on file   Social Determinants of Health   Financial Resource Strain: Not on file  Food Insecurity: Not on file  Transportation Needs: Not on file  Physical Activity: Not on file  Stress: Not on file  Social Connections: Not on file  Intimate Partner Violence: Not on file    Outpatient Medications Prior to Visit  Medication Sig Dispense Refill   EPINEPHrine 0.3 mg/0.3 mL IJ SOAJ injection Inject 0.3 mg into the muscle as needed for anaphylaxis. 2 each 0   famotidine (PEPCID) 40 MG tablet Take 1 tablet (40 mg  total) by mouth 2 (two) times daily. 60 tablet 5   GI Cocktail (alum & mag hydroxide, lidocaine, dicyclomine) oral mixture 90 ml 2 % Lidocaine, 90 ml Dicyclomine 78m/5ml, 270 ml Maalox - Take 5-10 ml every 4-6 hours as needed. 450 mL 1   hydrOXYzine (ATARAX/VISTARIL) 10 MG tablet Take 0.5-1 tablets (5-10 mg total) by mouth at bedtime as needed (sleep). 90 tablet 3   levothyroxine (SYNTHROID) 75 MCG tablet Take 1 tablet (75 mcg total) by mouth daily. 90 tablet 3   liothyronine (CYTOMEL) 5 MCG tablet Take 1 tablet (5 mcg total) by mouth daily. 90 tablet 3   loratadine (CLARITIN) 10 MG tablet Take 10 mg by mouth daily.     methocarbamol (ROBAXIN) 500 MG tablet Take 1 tablet (500 mg total) by mouth every 6 (six) hours as needed for muscle spasms. 60 tablet 0   polyethylene glycol (MIRALAX) 17 g packet Take 17 g by mouth every other day. 14 each 0    sucralfate (CARAFATE) 1 g tablet Take 1 tablet (1 g total) by mouth every 6 (six) hours as needed. Slowy dissolve 1 tablet in 1 Tablespoon of distilled water to make a slurry before ingesting 90 tablet 2   venlafaxine XR (EFFEXOR XR) 75 MG 24 hr capsule Take 1 capsule (75 mg total) by mouth daily with breakfast. 30 capsule 2   vitamin B-12 (CYANOCOBALAMIN) 1000 MCG tablet Take 1,000 mcg by mouth daily.     vitamin k 100 MCG tablet Take 100 mcg by mouth daily.     Facility-Administered Medications Prior to Visit  Medication Dose Route Frequency Provider Last Rate Last Admin   0.9 %  sodium chloride infusion  500 mL Intravenous Once Armbruster, SCarlota Raspberry MD       dextrose 5 % solution   Intravenous Continuous Armbruster, SCarlota Raspberry MD        Allergies  Allergen Reactions   Bee Venom Anaphylaxis   Flagyl [Metronidazole] Anaphylaxis   Iodine Anaphylaxis   Shellfish Allergy Anaphylaxis   Tramadol Hcl Other (See Comments)    Showed signs of stroke   Gluten Meal    Lactose Intolerance (Gi)     ROS Review of Systems  Constitutional:  Positive for unexpected weight change.  HENT: Negative.    Eyes: Negative.   Respiratory: Negative.    Cardiovascular: Negative.   Gastrointestinal: Negative.   Genitourinary: Negative.   Musculoskeletal: Negative.   Skin: Negative.   Neurological: Negative.   Psychiatric/Behavioral: Negative.    All other systems reviewed and are negative.    Objective:    Physical Exam Vitals and nursing note reviewed.  Constitutional:      General: She is not in acute distress.    Appearance: Normal appearance. She is normal weight. She is not ill-appearing, toxic-appearing or diaphoretic.  HENT:     Head: Normocephalic and atraumatic.     Right Ear: Tympanic membrane, ear canal and external ear normal. There is no impacted cerumen.     Left Ear: Tympanic membrane, ear canal and external ear normal. There is no impacted cerumen.     Nose: Nose normal. No  congestion or rhinorrhea.     Mouth/Throat:     Mouth: Mucous membranes are moist.     Pharynx: Oropharynx is clear. No oropharyngeal exudate or posterior oropharyngeal erythema.  Eyes:     General: No scleral icterus.       Right eye: No discharge.        Left  eye: No discharge.     Extraocular Movements: Extraocular movements intact.     Conjunctiva/sclera: Conjunctivae normal.     Pupils: Pupils are equal, round, and reactive to light.  Cardiovascular:     Rate and Rhythm: Normal rate and regular rhythm.     Pulses: Normal pulses.     Heart sounds: Normal heart sounds. No murmur heard.   No friction rub. No gallop.  Pulmonary:     Effort: Pulmonary effort is normal. No respiratory distress.     Breath sounds: Normal breath sounds. No stridor. No wheezing, rhonchi or rales.  Chest:     Chest wall: No tenderness.  Abdominal:     General: Abdomen is flat. Bowel sounds are normal. There is no distension.     Palpations: Abdomen is soft. There is no mass.     Tenderness: There is no abdominal tenderness. There is no right CVA tenderness, left CVA tenderness, guarding or rebound.     Hernia: No hernia is present.  Musculoskeletal:        General: No swelling, tenderness, deformity or signs of injury. Normal range of motion.     Right lower leg: No edema.     Left lower leg: No edema.  Skin:    General: Skin is warm and dry.     Capillary Refill: Capillary refill takes less than 2 seconds.     Coloration: Skin is not jaundiced or pale.     Findings: No bruising, erythema, lesion or rash.  Neurological:     General: No focal deficit present.     Mental Status: She is alert and oriented to person, place, and time. Mental status is at baseline.     Cranial Nerves: No cranial nerve deficit.     Sensory: No sensory deficit.     Motor: No weakness.     Coordination: Coordination normal.     Gait: Gait normal.     Deep Tendon Reflexes: Reflexes normal.  Psychiatric:        Mood and  Affect: Mood normal.        Behavior: Behavior normal.        Thought Content: Thought content normal.        Judgment: Judgment normal.    BP 131/90   Pulse 74   Temp 98 F (36.7 C) (Temporal)   Resp 18   Ht 5' 11"  (1.803 m)   Wt 163 lb 9.6 oz (74.2 kg)   BMI 22.82 kg/m  Wt Readings from Last 3 Encounters:  03/16/21 163 lb 9.6 oz (74.2 kg)  03/16/21 164 lb 9.6 oz (74.7 kg)  02/14/21 160 lb (72.6 kg)     There are no preventive care reminders to display for this patient.  There are no preventive care reminders to display for this patient.  Lab Results  Component Value Date   TSH 1.28 01/24/2021   Lab Results  Component Value Date   WBC 5.9 12/18/2020   HGB 14.0 12/18/2020   HCT 41.3 12/18/2020   MCV 92.6 12/18/2020   PLT 263 12/18/2020   Lab Results  Component Value Date   NA 138 12/18/2020   K 3.8 12/18/2020   CO2 24 12/18/2020   GLUCOSE 130 (H) 12/18/2020   BUN 15 12/18/2020   CREATININE 1.23 (H) 12/18/2020   BILITOT 0.4 10/20/2020   ALKPHOS 64 10/20/2020   AST 31 10/20/2020   ALT 29 10/20/2020   PROT 7.0 10/20/2020   ALBUMIN 4.5 10/20/2020  CALCIUM 10.1 12/18/2020   ANIONGAP 12 12/18/2020   GFR 47.57 (L) 08/20/2019   Lab Results  Component Value Date   CHOL 179 10/21/2020   Lab Results  Component Value Date   HDL 63 10/21/2020   Lab Results  Component Value Date   LDLCALC 101 (H) 10/21/2020   Lab Results  Component Value Date   TRIG 77 10/21/2020   Lab Results  Component Value Date   CHOLHDL 2.8 10/21/2020   Lab Results  Component Value Date   HGBA1C 5.2 10/21/2020      Assessment & Plan:   Problem List Items Addressed This Visit   None Visit Diagnoses     Chronic fatigue    -  Primary   Relevant Orders   Comprehensive metabolic panel   Hemoglobin A1c   Lipid panel   Vitamin D (25 hydroxy)   B12 and Folate Panel   Hiatal hernia       Relevant Orders   CBC with Differential/Platelet   Comprehensive metabolic panel    Hemoglobin A1c   Lipid panel   Vitamin D (25 hydroxy)   B12 and Folate Panel   Annual physical exam       Sleep disturbance       Relevant Medications   traZODone (DESYREL) 50 MG tablet       Meds ordered this encounter  Medications   traZODone (DESYREL) 50 MG tablet    Sig: Take 0.5-1 tablets (25-50 mg total) by mouth at bedtime as needed for sleep.    Dispense:  30 tablet    Refill:  3    Order Specific Question:   Supervising Provider    Answer:   Carlota Raspberry, JEFFREY R [[id number]]     Follow-up: No follow-ups on file.   PLAN Labs collected. Will follow up with the patient as warranted. Exam unremarkable Consider nutrition referral. Unfortunately given that her weight is wnl, I do not feel comfortable starting a dedicated weight loss agent - pt understands this. Unfortunatelyher insurance will not cover medical weight management. Trazodone 25-40m po qhs prn for sleep  Patient encouraged to call clinic with any questions, comments, or concerns.  RMaximiano Coss NP

## 2021-03-16 NOTE — Patient Instructions (Addendum)
Ms. Kambrey Hagger to see you  Let's figure out what's happening - if labs don't show anything, we'll keep pushing  I'll have nutrition and sleep med reach out   Thanks  Rich     If you have lab work done today you will be contacted with your lab results within the next 2 weeks.  If you have not heard from [place] then please contact us. The fastest way to get your results is to register for My Chart.   IF you received an x-ray today, you will receive an invoice from [facility]. Please contact [facility] at [phone] with questions or concerns regarding your invoice.   IF you received labwork today, you will receive an invoice from [facility]. Please contact [facility] at [phone] with questions or concerns regarding your invoice.   Our billing staff will not be able to assist you with questions regarding bills from these companies.  You will be contacted with the lab results as soon as they are available. The fastest way to get your results is to activate your My Chart account. Instructions are located on the last page of this paperwork. If you have not heard from [place] regarding the results in 2 weeks, please contact this office.

## 2021-03-16 NOTE — Patient Instructions (Signed)
-   Start Liothyronine 5 mcg daily  - Continue Levothyroxine 75 mcg daily

## 2021-03-19 ENCOUNTER — Other Ambulatory Visit (INDEPENDENT_AMBULATORY_CARE_PROVIDER_SITE_OTHER): Payer: 59

## 2021-03-19 ENCOUNTER — Other Ambulatory Visit: Payer: Self-pay

## 2021-03-19 DIAGNOSIS — E039 Hypothyroidism, unspecified: Secondary | ICD-10-CM

## 2021-03-19 LAB — TSH: TSH: 0.93 u[IU]/mL (ref 0.35–5.50)

## 2021-03-27 ENCOUNTER — Other Ambulatory Visit: Payer: Self-pay | Admitting: Obstetrics and Gynecology

## 2021-03-28 NOTE — Telephone Encounter (Signed)
Med refill request: Effexor XR 13m Last AEX: OV 01/24/21, increased Effexor XR, return 3 mo for AEX Next AEX: 05/03/21 Last MMG (if hormonal med) TBC 09/24/19, neg Refill authorized: Please Advise?   Spoke with patient.  Patient reports she is still experiencing "weight issues, feeling sluggish and no energy". Not sleeping well, has discussed this with PCP and was prescribed trazodone for sleep PRN. She is still taking the Effexor and request refill until next visit. Advised I will send update to Dr. SQuincy Simmonds Our office will f/u if any additional recommendations, otherwise f/u with pharmacy for refill. Patient agreeable.   Routing to Dr. SQuincy Simmonds

## 2021-04-06 ENCOUNTER — Ambulatory Visit: Payer: 59 | Admitting: Gastroenterology

## 2021-04-08 NOTE — Telephone Encounter (Signed)
This concern has been previously addressed by myself and/or another provider.  If they patient has ongoing concerns, they can contact me at their convenience.  Thank you,  Rich Arjay Jaskiewicz, NP 

## 2021-04-29 ENCOUNTER — Encounter: Payer: Self-pay | Admitting: Obstetrics and Gynecology

## 2021-05-03 ENCOUNTER — Encounter: Payer: Self-pay | Admitting: Internal Medicine

## 2021-05-03 ENCOUNTER — Other Ambulatory Visit: Payer: Self-pay | Admitting: Registered Nurse

## 2021-05-03 ENCOUNTER — Other Ambulatory Visit: Payer: Self-pay

## 2021-05-03 ENCOUNTER — Ambulatory Visit (INDEPENDENT_AMBULATORY_CARE_PROVIDER_SITE_OTHER): Payer: 59 | Admitting: Obstetrics and Gynecology

## 2021-05-03 ENCOUNTER — Encounter: Payer: Self-pay | Admitting: Obstetrics and Gynecology

## 2021-05-03 VITALS — BP 128/86 | HR 88 | Ht 69.5 in | Wt 163.0 lb

## 2021-05-03 DIAGNOSIS — Z01419 Encounter for gynecological examination (general) (routine) without abnormal findings: Secondary | ICD-10-CM | POA: Diagnosis not present

## 2021-05-03 DIAGNOSIS — Z1231 Encounter for screening mammogram for malignant neoplasm of breast: Secondary | ICD-10-CM

## 2021-05-03 MED ORDER — VENLAFAXINE HCL ER 75 MG PO CP24: ORAL_CAPSULE | ORAL | 11 refills | Status: DC

## 2021-05-03 NOTE — Progress Notes (Signed)
55 y.o. G2P2 Married Caucasian female here for annual exam.    Effexor is not helping with mood swings or hot flashes.  Having hot flashes day and night.  Not sleeping well.  Received Rx for Trazodone, and this is not working.  Feels lethargic.  No energy.   Tired Neurontin and she did not fell well with this.   PCP:   Maximiano Coss, NP  No LMP recorded. Patient has had an ablation.           Sexually active: Yes.    The current method of family planning is tubal ligation.    Exercising: No.  The patient does not participate in regular exercise at present. Smoker:  no  Health Maintenance: Pap:  08-23-19 Neg:Neg HR HPV History of abnormal Pap:  no MMG:  09-24-19 Neg/density C/Birads1.   Colonoscopy: 5 years POL:IDCVUD;THYHOO when due next.  She has had endoscopy.  She is due for follow up with her GI and will call to scheule.  BMD:   n/a  Result  n/a TDaP:  02-05-15 Gardasil:   no HIV: 10-21-20 NR Hep C: no Screening Labs:  PCP.  Flu vaccine:  declines.  She had illness after this.  Not certain her diagnosis.    reports that she has never smoked. She has never been exposed to tobacco smoke. She has never used smokeless tobacco. She reports current alcohol use of about 2.0 standard drinks per week. She reports that she does not use drugs.  Past Medical History:  Diagnosis Date   Diverticulosis 02/19/2018   By colonoscopy 2016   Fibroid    GERD (gastroesophageal reflux disease)    Hx of concussion    IBS (irritable bowel syndrome)    Iron excess 2022   Kidney disease    Stage III--due to medication dexilant   Leukopenia 2022   Prediabetes    Thyroid disease     Past Surgical History:  Procedure Laterality Date   COLONOSCOPY  2016   ENDOMETRIAL ABLATION     SHOULDER SURGERY Left    TUBAL LIGATION     UPPER GASTROINTESTINAL ENDOSCOPY  11/10/2019    Current Outpatient Medications  Medication Sig Dispense Refill   EPINEPHrine 0.3 mg/0.3 mL IJ SOAJ injection Inject 0.3  mg into the muscle as needed for anaphylaxis. 2 each 0   famotidine (PEPCID) 40 MG tablet Take 1 tablet (40 mg total) by mouth 2 (two) times daily. 60 tablet 5   GI Cocktail (alum & mag hydroxide, lidocaine, dicyclomine) oral mixture 90 ml 2 % Lidocaine, 90 ml Dicyclomine 33m/5ml, 270 ml Maalox - Take 5-10 ml every 4-6 hours as needed. 450 mL 1   hydrOXYzine (ATARAX/VISTARIL) 10 MG tablet Take 0.5-1 tablets (5-10 mg total) by mouth at bedtime as needed (sleep). 90 tablet 3   levothyroxine (SYNTHROID) 75 MCG tablet Take 1 tablet (75 mcg total) by mouth daily. 90 tablet 3   liothyronine (CYTOMEL) 5 MCG tablet Take 1 tablet (5 mcg total) by mouth daily. 90 tablet 3   loratadine (CLARITIN) 10 MG tablet Take 10 mg by mouth daily.     methocarbamol (ROBAXIN) 500 MG tablet Take 1 tablet (500 mg total) by mouth every 6 (six) hours as needed for muscle spasms. 60 tablet 0   polyethylene glycol (MIRALAX) 17 g packet Take 17 g by mouth every other day. 14 each 0   sucralfate (CARAFATE) 1 g tablet Take 1 tablet (1 g total) by mouth every 6 (six) hours  as needed. Slowy dissolve 1 tablet in 1 Tablespoon of distilled water to make a slurry before ingesting 90 tablet 2   traZODone (DESYREL) 50 MG tablet Take 0.5-1 tablets (25-50 mg total) by mouth at bedtime as needed for sleep. 30 tablet 3   venlafaxine XR (EFFEXOR-XR) 75 MG 24 hr capsule TAKE 1 CAPSULE(75 MG) BY MOUTH DAILY WITH BREAKFAST 30 capsule 1   vitamin B-12 (CYANOCOBALAMIN) 1000 MCG tablet Take 1,000 mcg by mouth daily.     vitamin k 100 MCG tablet Take 100 mcg by mouth daily.     Current Facility-Administered Medications  Medication Dose Route Frequency Provider Last Rate Last Admin   0.9 %  sodium chloride infusion  500 mL Intravenous Once Armbruster, Carlota Raspberry, MD       dextrose 5 % solution   Intravenous Continuous Armbruster, Carlota Raspberry, MD        Family History  Problem Relation Age of Onset   Testicular cancer Brother    Esophageal cancer  Brother    Healthy Daughter    Healthy Son    Hypotension Maternal Grandmother    Aortic dissection Maternal Grandmother    Heart disease Maternal Grandfather    Diabetes Paternal Grandmother        AODM   Diabetes Paternal Grandfather        AODM   Ovarian cancer Paternal Great-grandmother     Review of Systems  Constitutional:  Positive for fatigue and unexpected weight change (weight gain).  All other systems reviewed and are negative.  Exam:   BP 128/86   Pulse 88   Ht 5' 9.5" (1.765 m)   Wt 163 lb (73.9 kg)   SpO2 98%   BMI 23.73 kg/m     General appearance: alert, cooperative and appears stated age Head: normocephalic, without obvious abnormality, atraumatic Neck: no adenopathy, supple, symmetrical, trachea midline and thyroid normal to inspection and palpation Lungs: clear to auscultation bilaterally Breasts: normal appearance, no masses or tenderness, No nipple retraction or dimpling, No nipple discharge or bleeding, No axillary adenopathy Heart: regular rate and rhythm Abdomen: soft, non-tender; no masses, no organomegaly Extremities: extremities normal, atraumatic, no cyanosis or edema Skin: skin color, texture, turgor normal. No rashes or lesions Lymph nodes: cervical, supraclavicular, and axillary nodes normal. Neurologic: grossly normal  Pelvic: External genitalia:  no lesions              No abnormal inguinal nodes palpated.              Urethra:  normal appearing urethra with no masses, tenderness or lesions              Bartholins and Skenes: normal                 Vagina: normal appearing vagina with normal color and discharge, no lesions              Cervix: no lesions              Pap taken: no Bimanual Exam:  Uterus:  normal size, contour, position, consistency, mobility, non-tender              Adnexa: no mass, fullness, tenderness              Rectal exam: yes.  Confirms.              Anus:  normal sphincter tone, no lesions  Chaperone was  present for exam:  Raquel Sarna, RN  Assessment:   Well woman visit with gynecologic exam. Status post tubal ligation.  Status post endometrial ablation.  Menopausal symptoms.  Mood swings.   Renal disease.  Hypothyroidism.  Plan: Mammogram screening discussed. Self breast awareness reviewed. Pap and HR HPV as above. Guidelines for Calcium, Vitamin D, regular exercise program including cardiovascular and weight bearing exercise. I recommended Estroven for herbal treatment of menopausal symptoms.  Refill of Effexor XR 75 mg daily. I gave refills for one year.   I recommend see psychiatry.  I have given her a list of providers.  She will call if she needs a referral.  We discussed Paxil as an alternative to Effexor for treating hot flashes, but I will defer to her psychiatrist to determine her medication needs.  Follow up annually and prn.   After visit summary provided.

## 2021-05-03 NOTE — Patient Instructions (Signed)

## 2021-05-16 ENCOUNTER — Other Ambulatory Visit: Payer: 59

## 2021-05-21 ENCOUNTER — Other Ambulatory Visit: Payer: Self-pay

## 2021-05-21 ENCOUNTER — Encounter: Payer: Self-pay | Admitting: Gastroenterology

## 2021-05-21 ENCOUNTER — Telehealth: Payer: Self-pay

## 2021-05-21 ENCOUNTER — Ambulatory Visit (INDEPENDENT_AMBULATORY_CARE_PROVIDER_SITE_OTHER): Payer: 59 | Admitting: Gastroenterology

## 2021-05-21 VITALS — BP 110/70 | HR 90 | Ht 71.0 in | Wt 164.0 lb

## 2021-05-21 DIAGNOSIS — K219 Gastro-esophageal reflux disease without esophagitis: Secondary | ICD-10-CM

## 2021-05-21 DIAGNOSIS — K582 Mixed irritable bowel syndrome: Secondary | ICD-10-CM

## 2021-05-21 DIAGNOSIS — R6881 Early satiety: Secondary | ICD-10-CM

## 2021-05-21 MED ORDER — GAVISCON 80-14.2 MG PO CHEW: CHEWABLE_TABLET | ORAL | Status: DC

## 2021-05-21 MED ORDER — CITRUCEL PO POWD: ORAL | Status: DC

## 2021-05-21 NOTE — Progress Notes (Signed)
Order for TTG

## 2021-05-21 NOTE — Telephone Encounter (Signed)
Referred to [facility] - Dr. Greer Pickerel re: Candace Allen

## 2021-05-21 NOTE — Patient Instructions (Addendum)
If you are age 56 or older, your body mass index should be between 23-30. Your Body mass index is 22.87 kg/m. If this is out of the aforementioned range listed, please consider follow up with your Primary Care Provider.  If you are age 23 or younger, your body mass index should be between 19-25. Your Body mass index is 22.87 kg/m. If this is out of the aformentioned range listed, please consider follow up with your Primary Care Provider.   ________________________________________________________  The [facility] providers would like to encourage you to use [facility] to communicate with providers for non-urgent requests or questions.  Due to long hold times on the telephone, sending your provider a message by [facility] may be a faster and more efficient way to get a response.  Please allow 48 business hours for a response.  Please remember that this is for non-urgent requests.  _______________________________________________________   Please go to the lab in the basement of our building to have lab work done as you leave today. Hit "B" for basement when you get on the elevator.  When the doors open the lab is on your left.  We will call you with the results. Thank you.  We will refer you to [facility], Dr. Greer Pickerel.  They will contact you to schedule an appointment. It may take 1-2 weeks before you hear from them. If they have not contacted you within  2 weeks please contact our office and we will follow up with them.    Continue Pepcid 40 mg: Twice daily  Please purchase the following medications over the counter and take as directed: Gavison: Take as needed Citrucel: Take daily  We are giving you a Low-FODMAP diet handout today. FODMAPs are short-chain carbohydrates (sugars) that are highly fermentable, which means that they go through chemical changes in the GI system, and are poorly absorbed during digestion. When FODMAPs reach the colon (large intestine), bacteria ferment these  sugars, turning them into gas and chemicals. This stretches the walls of the colon, causing abdominal bloating, distension, cramping, pain, and/or changes in bowel habits in many patients with IBS. FODMAPs are not unhealthy or harmful, but may exacerbate GI symptoms in those with sensitive GI tracts.  Thank you for entrusting me with your care and for choosing [facility], Dr. Superior Cellar

## 2021-05-21 NOTE — Progress Notes (Signed)
HPI :  55 year old female here for a follow-up visit for GERD and IBS.  Recall she has a history of longstanding GERD for several years.  Symptoms can vary in regards to severity and frequency, often can come and go.  Typically on most days she will have symptoms during the day and night.  Pyrosis is her main symptom that bothers her.  This will typically bother her at least half the days of the week.  She denies any clear trigger foods that bother her.  Recall she has been tried on omeprazole in the past which caused throat closing.  She has been on Nexium and Dexilant in the past, the latter was thought to have contributed to chronic kidney disease, her creatinine is baseline 1.2 with a GFR of 40s to 50s.  Since have last seen her she has been on Pepcid 40 mg twice daily since August.  She states this really does not work too well and her symptoms continue to bother her.  She has Carafate at home which she takes as needed, pretty rarely, does not seem to help too much.  Recall I did an EGD for her in May 2021 which showed a 2 cm hiatal hernia, otherwise no Barrett's esophagus, she did have some retained fluid in her stomach.  Hill grade 4 retro flex views of the cardia.  In this light it was thought that she is not a candidate for TIF.  She had a subsequent gastric emptying study which was normal, no gastroparesis.  In the past she has had constipation predominant IBS.  She states more so recently she has 50% constipated stools and, and then 50% loose stools.  She has been on MiraLAX for this in the past but been hard for her to get a good dosing regimen that works for her.  She states is either too strong or not strong enough.  Has been on Metamucil in the past which was complicated by bloating.  She is 100% gluten free as she states gluten often bothers her and she is also lactose-free.  She inquires about dietary measures to manage this.  She had colonoscopy reportedly in 2016 as well which she  thinks was normal, no report of that available, she does not think she had any polyps removed.   Prior work-up: EGD 11/10/19 -  - A 2 cm hiatal hernia was present. - The exam of the esophagus was otherwise normal. No erosive esophagitis or Barrett's - Excessive fluid was found in the gastric body and suctioned. - The exam of the stomach was otherwise normal. No outlet obstruction. Hill grade IV retroflexed views of the cardia. - Biopsies were taken with a cold forceps in the gastric body, at the incisura and in the gastric antrum for Helicobacter pylori testing. 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 - The exam of the duodenum was otherwise normal.  Overall no erosive changes or damage to the esophagus from reflux, can continue present regimen for now. If you wish to consider non medical therapy for reflux, given Hill grade IV views of the cardia I don't think patient is candidate for TIF, would consider surgical evaluation although would recommend gastric emptying study to rule out gastroparesis.   Diagnosis Surgical [P], gastric antrum and gastric body - ANTRAL AND OXYNTIC MUCOSA WITH HYPEREMIA. - WARTHIN-STARRY NEGATIVE FOR HELICOBACTER PYLORI. - NO INTESTINAL METAPLASIA, DYSPLASIA OR CARCINOMA   GES 11/24/19 - normal   Echo  01/31/21 - EF 60-65%  Nuclear stress test 01/31/21 - low risk study     Past Medical History:  Diagnosis Date   Diverticulosis 02/19/2018   By colonoscopy 2016   Fibroid    GERD (gastroesophageal reflux disease)    Hx of concussion    IBS (irritable bowel syndrome)    Iron excess 2022   Kidney disease    Stage III--due to medication dexilant   Leukopenia 2022   Prediabetes    Thyroid disease      Past Surgical History:  Procedure Laterality Date   COLONOSCOPY  2016   ENDOMETRIAL ABLATION     SHOULDER SURGERY Left    TUBAL LIGATION     UPPER GASTROINTESTINAL ENDOSCOPY  11/10/2019    Family History  Problem Relation Age of Onset   Peripheral Artery Disease Mother    Other Father        Hx unknown to patient   Testicular cancer Brother    Esophageal cancer Brother    Hypotension Maternal Grandmother    Aortic dissection Maternal Grandmother    Heart disease Maternal Grandfather    Diabetes Paternal Grandmother        AODM   Diabetes Paternal Grandfather        AODM   Healthy Daughter    Healthy Son    Ovarian cancer Paternal Great-grandmother    Social History   Tobacco Use   Smoking status: Never    Passive exposure: Never   Smokeless tobacco: Never  Vaping Use   Vaping Use: Never used  Substance Use Topics   Alcohol use: Yes    Alcohol/week: 2.0 standard drinks    Types: 2 Standard drinks or equivalent per week    Comment: social   Drug use: Never   Current Outpatient Medications  Medication Sig Dispense Refill   EPINEPHrine 0.3 mg/0.3 mL IJ SOAJ injection Inject 0.3 mg into the muscle as needed for anaphylaxis. 2 each 0   famotidine (PEPCID) 40 MG tablet Take 1 tablet (40 mg total) by mouth 2 (two) times daily. 60 tablet 5   GI Cocktail (alum & mag hydroxide, lidocaine, dicyclomine) oral mixture 90 ml 2 % Lidocaine, 90 ml Dicyclomine 60m/5ml, 270 ml Maalox - Take 5-10 ml every 4-6 hours as needed. 450 mL 1   hydrOXYzine (ATARAX/VISTARIL) 10 MG tablet Take 0.5-1 tablets (5-10 mg total) by mouth at bedtime as needed (sleep). 90 tablet 3   levothyroxine (SYNTHROID) 75 MCG tablet Take 1 tablet (75 mcg total) by mouth daily. 90 tablet 3   liothyronine (CYTOMEL) 5 MCG tablet Take 1 tablet (5 mcg total) by mouth daily. 90 tablet 3   loratadine (CLARITIN) 10 MG tablet Take 10 mg by mouth daily.     methocarbamol (ROBAXIN) 500 MG tablet Take 1 tablet (500 mg total) by mouth every 6 (six) hours as needed for muscle spasms. 60 tablet 0   polyethylene glycol (MIRALAX) 17 g packet Take 17 g by mouth every other day. 14 each 0   sucralfate (CARAFATE) 1 g  tablet Take 1 tablet (1 g total) by mouth every 6 (six) hours as needed. Slowy dissolve 1 tablet in 1 Tablespoon of distilled water to make a slurry before ingesting 90 tablet 2   traZODone (DESYREL) 50 MG tablet Take 0.5-1 tablets (25-50 mg total) by mouth at bedtime as needed for sleep. 30 tablet 3   venlafaxine XR (EFFEXOR-XR) 75 MG 24 hr capsule TAKE 1 CAPSULE(75 MG) BY MOUTH DAILY WITH  BREAKFAST 30 capsule 11   vitamin B-12 (CYANOCOBALAMIN) 1000 MCG tablet Take 1,000 mcg by mouth daily.     vitamin k 100 MCG tablet Take 100 mcg by mouth daily.     Current Facility-Administered Medications  Medication Dose Route Frequency Provider Last Rate Last Admin   0.9 %  sodium chloride infusion  500 mL Intravenous Once Tionna Gigante, Carlota Raspberry, MD       dextrose 5 % solution   Intravenous Continuous Shterna Laramee, Carlota Raspberry, MD       Allergies  Allergen Reactions   Bee Venom Anaphylaxis   Flagyl [Metronidazole] Anaphylaxis   Iodine Anaphylaxis   Shellfish Allergy Anaphylaxis   Tramadol Hcl Other (See Comments)    Showed signs of stroke   Gluten Meal    Lactose Intolerance (Gi)      Review of Systems: All systems reviewed and negative except where noted in HPI.    Lab Results  Component Value Date   WBC 5.9 12/18/2020   HGB 14.0 12/18/2020   HCT 41.3 12/18/2020   MCV 92.6 12/18/2020   PLT 263 12/18/2020    Lab Results  Component Value Date   CREATININE 1.23 (H) 12/18/2020   BUN 15 12/18/2020   NA 138 12/18/2020   K 3.8 12/18/2020   CL 102 12/18/2020   CO2 24 12/18/2020     Physical Exam: BP 110/70   Pulse 90   Ht 5' 11"  (1.803 m)   Wt 164 lb (74.4 kg)   BMI 22.87 kg/m  Constitutional: Pleasant,well-developed, female in no acute distress. Neurological: Alert and oriented to person place and time. Psychiatric: Normal mood and affect. Behavior is normal.   ASSESSMENT AND PLAN: 55 year old female here for reassessment of the following:  GERD IBS  We had a discussion  about her ongoing reflux symptoms as above.  Unfortunately antihistamines are not controlling this adequately at all.  She has had severe reaction to omeprazole in the past and does not want to use any PPIs moving forward in light of her CKD, it was thought that [place] could have played a role in this previously.  Given her significant symptoms, EGD was done to see if she was a candidate for TIF and she has not given Hill grade IV classification of the cardia.  Ultimately I think surgical fundoplication is her best option at this point for management of her reflux.  She has tested negative for gastroparesis.  We discussed what this would entail, she is interested in pursuing this.  I will refer her to Dr. Redmond Pulling of [facility] to discuss fundoplication.  She will continue Pepcid for now, can decrease dose to 20 mg twice daily.  Would add Gaviscon as needed to see if that will help.  She continues to want to hold off on PPI  We otherwise discussed her bowel habits.  We will switch her from MiraLAX to Citrucel once daily, hopefully this will provide some regularity with minimal bloating compared to Metamucil.  Counseled her on a low FODMAP diet and she wants to try that.  I will screen her for celiac disease with basic serologies given she endorses gluten intolerance however counseled her normal test would not necessarily rule it out given she is gluten-free.  We may consider genetic testing for celiac to further rule that out.  She agrees  Plan: - referral to [facility] - Dr. Redmond Pulling - possible Nissen - continue pepcid, as above holding off on PPI - add Gaviscon PRN - lab today  TTG IgA - she is on gluten free diet, may need genetic testing as well - trial of Low FODMAP diet, handouts provided - start Citrucel daily  Jolly Mango, MD [facility]

## 2021-05-23 ENCOUNTER — Other Ambulatory Visit: Payer: Self-pay | Admitting: Internal Medicine

## 2021-05-23 DIAGNOSIS — E039 Hypothyroidism, unspecified: Secondary | ICD-10-CM

## 2021-05-23 NOTE — Telephone Encounter (Signed)
Patient has an appointment on 06-27-21 with Dr. Redmond Pulling with [facility].

## 2021-05-28 ENCOUNTER — Other Ambulatory Visit: Payer: Self-pay

## 2021-05-28 ENCOUNTER — Other Ambulatory Visit (INDEPENDENT_AMBULATORY_CARE_PROVIDER_SITE_OTHER): Payer: 59

## 2021-05-28 ENCOUNTER — Other Ambulatory Visit: Payer: Self-pay | Admitting: Internal Medicine

## 2021-05-28 ENCOUNTER — Other Ambulatory Visit: Payer: 59

## 2021-05-28 DIAGNOSIS — E039 Hypothyroidism, unspecified: Secondary | ICD-10-CM

## 2021-05-28 DIAGNOSIS — K582 Mixed irritable bowel syndrome: Secondary | ICD-10-CM

## 2021-05-28 DIAGNOSIS — R6881 Early satiety: Secondary | ICD-10-CM

## 2021-05-28 DIAGNOSIS — K219 Gastro-esophageal reflux disease without esophagitis: Secondary | ICD-10-CM

## 2021-05-28 LAB — TSH: TSH: 0.94 u[IU]/mL (ref 0.35–5.50)

## 2021-05-29 LAB — TISSUE TRANSGLUTAMINASE, IGA: (tTG) Ab, IgA: <1 U/mL

## 2021-06-07 ENCOUNTER — Ambulatory Visit
Admission: RE | Admit: 2021-06-07 | Discharge: 2021-06-07 | Disposition: A | Discharge status: Home / Self Care | Payer: 59 | Source: Ambulatory Visit | Attending: Registered Nurse | Admitting: Registered Nurse

## 2021-06-07 DIAGNOSIS — Z1231 Encounter for screening mammogram for malignant neoplasm of breast: Secondary | ICD-10-CM

## 2021-06-12 ENCOUNTER — Other Ambulatory Visit: Payer: Self-pay | Admitting: Registered Nurse

## 2021-06-12 DIAGNOSIS — R928 Other abnormal and inconclusive findings on diagnostic imaging of breast: Secondary | ICD-10-CM

## 2021-06-14 ENCOUNTER — Other Ambulatory Visit: Payer: Self-pay | Admitting: Internal Medicine

## 2021-06-20 ENCOUNTER — Encounter: Payer: Self-pay | Admitting: Internal Medicine

## 2021-06-20 ENCOUNTER — Encounter: Payer: Self-pay | Admitting: Obstetrics and Gynecology

## 2021-06-20 MED ORDER — VENLAFAXINE HCL ER 75 MG PO CP24: ORAL_CAPSULE | ORAL | 11 refills | Status: DC

## 2021-06-27 ENCOUNTER — Telehealth: Payer: Self-pay | Admitting: Gastroenterology

## 2021-06-27 DIAGNOSIS — K219 Gastro-esophageal reflux disease without esophagitis: Secondary | ICD-10-CM

## 2021-06-27 DIAGNOSIS — R12 Heartburn: Secondary | ICD-10-CM | POA: Diagnosis not present

## 2021-06-27 DIAGNOSIS — K581 Irritable bowel syndrome with constipation: Secondary | ICD-10-CM | POA: Diagnosis not present

## 2021-06-27 DIAGNOSIS — K449 Diaphragmatic hernia without obstruction or gangrene: Secondary | ICD-10-CM

## 2021-06-27 NOTE — Telephone Encounter (Signed)
Received consult note from Dr. Redmond Pulling for repair of hiatal hernia and fundoplication evaluation. She is interested in having done, Dr. Redmond Pulling is requesting 24 hour pH test and manometry prior to proceeding with the operation.    Candace Allen can you please contact this patient to coordinate 24 hour pH test and manometry. She will need to hold PPI and H2 blocker for one week pre-procedure to make sure her pH test is accurate. She should also not use carafate one day prior or during the test as well. Thanks

## 2021-06-28 ENCOUNTER — Other Ambulatory Visit: Payer: Self-pay

## 2021-06-28 DIAGNOSIS — K219 Gastro-esophageal reflux disease without esophagitis: Secondary | ICD-10-CM

## 2021-06-28 DIAGNOSIS — K449 Diaphragmatic hernia without obstruction or gangrene: Secondary | ICD-10-CM

## 2021-06-28 NOTE — Telephone Encounter (Signed)
Called and spoke with patient. She has been scheduled for an esophageal manometry with 24 pH on Wednesday, 09/19/21 at 10:30 am. Pt is aware that she will need to arrive at 10 am. She knows that she will need to hold Famotidine for 1 week and will need to hold Carafate 1 days prior to the test. Pt states that she was told booking that far out may be a possibility but that is not an acceptable time frame for Dr. Redmond Pulling. She states that she will reach out to his office to see if he wants to refer her somewhere else to try to get her a sooner appt. Pt would like to keep 4/5 appt as scheduled for now. She will call back to cancel if she finds a sooner appt. She states that she has access to her my chart, she knows that I will send a copy of her instructions to her via my chart and I will be placing a copy in the mail. Pt verbalized understanding and had no concerns at the end of the call.  Ambulatory referral to GI in epic.

## 2021-06-28 NOTE — Addendum Note (Signed)
Addended by: Yevette Edwards on: 06/28/2021 09:17 AM   Modules accepted: Orders

## 2021-07-04 ENCOUNTER — Ambulatory Visit
Admission: RE | Admit: 2021-07-04 | Discharge: 2021-07-04 | Disposition: A | Discharge status: Home / Self Care | Payer: Medicaid Other | Source: Ambulatory Visit | Attending: Registered Nurse | Admitting: Registered Nurse

## 2021-07-04 ENCOUNTER — Ambulatory Visit
Admission: RE | Admit: 2021-07-04 | Discharge: 2021-07-04 | Disposition: A | Discharge status: Home / Self Care | Payer: 59 | Source: Ambulatory Visit | Attending: Registered Nurse | Admitting: Registered Nurse

## 2021-07-04 ENCOUNTER — Other Ambulatory Visit: Payer: Self-pay | Admitting: Registered Nurse

## 2021-07-04 DIAGNOSIS — R928 Other abnormal and inconclusive findings on diagnostic imaging of breast: Secondary | ICD-10-CM

## 2021-07-04 DIAGNOSIS — N6489 Other specified disorders of breast: Secondary | ICD-10-CM | POA: Diagnosis not present

## 2021-07-04 DIAGNOSIS — R922 Inconclusive mammogram: Secondary | ICD-10-CM | POA: Diagnosis not present

## 2021-07-10 NOTE — Telephone Encounter (Signed)
Esophageal manometry with 24 pH has been cancelled.

## 2021-07-18 ENCOUNTER — Other Ambulatory Visit: Payer: 59

## 2021-08-16 ENCOUNTER — Other Ambulatory Visit: Payer: Self-pay | Admitting: Obstetrics and Gynecology

## 2021-08-16 NOTE — Telephone Encounter (Signed)
Request for 90day Rx. ?LAEX 05/03/21.  ?

## 2021-08-22 DIAGNOSIS — R12 Heartburn: Secondary | ICD-10-CM | POA: Diagnosis not present

## 2021-08-22 DIAGNOSIS — K219 Gastro-esophageal reflux disease without esophagitis: Secondary | ICD-10-CM | POA: Diagnosis not present

## 2021-08-22 DIAGNOSIS — K449 Diaphragmatic hernia without obstruction or gangrene: Secondary | ICD-10-CM | POA: Diagnosis not present

## 2021-08-22 DIAGNOSIS — K581 Irritable bowel syndrome with constipation: Secondary | ICD-10-CM | POA: Diagnosis not present

## 2021-08-30 ENCOUNTER — Encounter: Payer: Self-pay | Admitting: Gastroenterology

## 2021-08-30 ENCOUNTER — Encounter: Payer: Self-pay | Admitting: Registered Nurse

## 2021-08-30 NOTE — Telephone Encounter (Signed)
Pt had an esophageal manometry completed at [facility] on 08/22/21. Report is not visible in care everywhere. I called [facility] at [phone], vm was for Joselyn. I left a detailed vm asking that they fax the esophageal manometry report to [place] at [phone] with attention to me. I asked that they call back with any questions.  ? ? ?[facility]   ?[address]   ?[address]   ?[place], [place] [address]   ?[phone]   ?

## 2021-08-30 NOTE — Telephone Encounter (Signed)
This is a duplicate message. See alternate my chart message. ? ?

## 2021-09-09 ENCOUNTER — Other Ambulatory Visit: Payer: Self-pay | Admitting: Registered Nurse

## 2021-09-09 DIAGNOSIS — G479 Sleep disorder, unspecified: Secondary | ICD-10-CM

## 2021-09-13 ENCOUNTER — Ambulatory Visit (INDEPENDENT_AMBULATORY_CARE_PROVIDER_SITE_OTHER): Payer: 59 | Admitting: Internal Medicine

## 2021-09-13 ENCOUNTER — Encounter: Payer: Self-pay | Admitting: Internal Medicine

## 2021-09-13 VITALS — BP 120/70 | HR 60 | Ht 71.0 in | Wt 168.0 lb

## 2021-09-13 DIAGNOSIS — R5383 Other fatigue: Secondary | ICD-10-CM | POA: Insufficient documentation

## 2021-09-13 DIAGNOSIS — E039 Hypothyroidism, unspecified: Secondary | ICD-10-CM | POA: Diagnosis not present

## 2021-09-13 LAB — BASIC METABOLIC PANEL
BUN: 14 mg/dL (ref 6–23)
CO2: 29 mEq/L (ref 19–32)
Calcium: 10.1 mg/dL (ref 8.4–10.5)
Chloride: 101 mEq/L (ref 96–112)
Creatinine, Ser: 1.28 mg/dL — ABNORMAL HIGH (ref 0.40–1.20)
GFR: 46.68 mL/min — ABNORMAL LOW (ref >60.00)
Glucose, Bld: 76 mg/dL (ref 70–99)
Potassium: 4.3 mEq/L (ref 3.5–5.1)
Sodium: 138 mEq/L (ref 135–145)

## 2021-09-13 LAB — CBC
HCT: 39 % (ref 36.0–46.0)
Hemoglobin: 13.3 g/dL (ref 12.0–15.0)
MCHC: 34.1 g/dL (ref 30.0–36.0)
MCV: 93.7 fl (ref 78.0–100.0)
Platelets: 238 10*3/uL (ref 150.0–400.0)
RBC: 4.17 Mil/uL (ref 3.87–5.11)
RDW: 12.5 % (ref 11.5–15.5)
WBC: 4 10*3/uL (ref 4.0–10.5)

## 2021-09-13 LAB — VITAMIN B12: Vitamin B-12: 1409 pg/mL — ABNORMAL HIGH (ref 211–911)

## 2021-09-13 LAB — VITAMIN D 25 HYDROXY (VIT D DEFICIENCY, FRACTURES): VITD: 84.65 ng/mL (ref 30.00–100.00)

## 2021-09-13 LAB — TSH: TSH: 0.97 u[IU]/mL (ref 0.35–5.50)

## 2021-09-13 MED ORDER — LIOTHYRONINE SODIUM 5 MCG PO TABS: 5.0000 ug | ORAL_TABLET | Freq: Every day | ORAL | 3 refills | Status: DC

## 2021-09-13 NOTE — Patient Instructions (Signed)
-   Start Liothyronine 5 mcg daily  ?- Continue Levothyroxine 75 mcg daily for now  ?

## 2021-09-13 NOTE — Progress Notes (Signed)
? ?Name: Candace Allen  ?MRN/ DOB: [id number], Feb 14, 1966    ?Age/ Sex: 56 y.o., female   ? ? ?PCP: Maximiano Coss, NP   ?Reason for Endocrinology Evaluation: Hypothyroidism  ?   ?Initial Endocrinology Clinic Visit: 11/09/2019  ? ? ?PATIENT IDENTIFIER: Candace Allen is a 56 y.o., female with a past medical history of IBS. She has followed with [facility] since 11/09/2019 for consultative assistance with management of her hypothyroidism.  ? ?HISTORICAL SUMMARY:  ? ?Pt has been diagnosed with hypothyroidism through integrative health specialist back in 11/2018 ?She presented there with hot flashes, weight gain , irritability and mood swings as well as fatigue and tiredness. She was started on armour thyroid and has been on this dose ever since. She was also started on Trulicity as well as a hormonal cocktail that includes testosterone. ? ? ?She was put on  hip pellets (?estrogen and testosterone ) In 06/2019 that would last for ~ 3 month  ?She is also on Progesterone 100 mg daily  ?  ?She does have hx of endometrial ablation, pt tells me they found tumors?  ?No FH of breast cancer or ovarian cancer  ?  ? ? ?On her initial visit to our clinic , we continued armour thyroid but this had to be switched to levothyroxine in 05/2020 due to insurance issues, by 2022 LT-3 replacement was started ? ? ? ?As for her HRT , this was not refilled, pt has a diagnosis of endometrial cancer in her chart and was advised to obtain records. She was also advised to stop progesterone. As she had already stopped hormonal pellets ?Pt was also advised to stop trulicity as she had no diagnosis of DM nor obesity ( BMI was 22.67 . ? ?SUBJECTIVE:  ? ? ?Today (09/13/2021):  Candace Allen is here for a follow up on hypothyroidism.  ? ? ? ? She is  on  Effexor for menopausal symptoms With some  improvement through Gyn, she stopped going there.  ? ?Has occasional Diarrhea due to IBS  ?Has constant Heartburn due to hiatal hernia, she is  debating hernia repair but no certainty yet  ?Denies palpitations  ?Continues with low energy and fatigue  ?No local neck symptoms  ?She is on MVI ? ? ?Levothyroxine 75 mcg daily  ?Liothyronine 5 mcg daily- not taking  ? ? ?HISTORY:  ?Past Medical History:  ?Past Medical History:  ?Diagnosis Date  ? Diverticulosis 02/19/2018  ? By colonoscopy 2016  ? Fibroid   ? GERD (gastroesophageal reflux disease)   ? Hx of concussion   ? IBS (irritable bowel syndrome)   ? Iron excess 2022  ? Kidney disease   ? Stage III--due to medication dexilant  ? Leukopenia 2022  ? Prediabetes   ? Thyroid disease   ? ?Past Surgical History:  ?Past Surgical History:  ?Procedure Laterality Date  ? COLONOSCOPY  2016  ? ENDOMETRIAL ABLATION    ? SHOULDER SURGERY Left   ? TUBAL LIGATION    ? UPPER GASTROINTESTINAL ENDOSCOPY  11/10/2019  ? ?Social History:  reports that she has never smoked. She has never been exposed to tobacco smoke. She has never used smokeless tobacco. She reports current alcohol use of about 2.0 standard drinks per week. She reports that she does not use drugs. ?Family History:  ?Family History  ?Problem Relation Age of Onset  ? Peripheral Artery Disease Mother   ? Other Father   ?     Hx  unknown to patient  ? Testicular cancer Brother   ? Esophageal cancer Brother   ? Hypotension Maternal Grandmother   ? Aortic dissection Maternal Grandmother   ? Heart disease Maternal Grandfather   ? Diabetes Paternal Grandmother   ?     AODM  ? Diabetes Paternal Grandfather   ?     AODM  ? Healthy Daughter   ? Healthy Son   ? Ovarian cancer Paternal Great-grandmother   ? ? ? ?HOME MEDICATIONS: ?Allergies as of 09/13/2021   ? ?   Reactions  ? Bee Venom Anaphylaxis  ? Flagyl [metronidazole] Anaphylaxis  ? Iodine Anaphylaxis  ? Shellfish Allergy Anaphylaxis  ? Tramadol Hcl Other (See Comments)  ? Showed signs of stroke  ? Gluten Meal   ? Lactose Intolerance (gi)   ? ?  ? ?  ?Medication List  ?  ? ?  ? Accurate as of September 13, 2021  7:32 AM. If  you have any questions, ask your nurse or doctor.  ?  ?  ? ?  ? ?Citrucel oral powder ?Generic drug: methylcellulose ?Use as directed Daily ?  ?EPINEPHrine 0.3 mg/0.3 mL Soaj injection ?Commonly known as: EPI-PEN ?Inject 0.3 mg into the muscle as needed for anaphylaxis. ?  ?famotidine 40 MG tablet ?Commonly known as: Pepcid ?Take 1 tablet (40 mg total) by mouth 2 (two) times daily. ?  ?Gaviscon 80-14.2 MG Chew ?Generic drug: Alum Hydroxide-Mag Trisilicate ?Use as directed ?  ?GI Cocktail (alum & mag hydroxide, lidocaine, dicyclomine) oral mixture ?90 ml 2 % Lidocaine, 90 ml Dicyclomine 98m/5ml, 270 ml Maalox - Take 5-10 ml every 4-6 hours as needed. ?  ?hydrOXYzine 10 MG tablet ?Commonly known as: ATARAX ?TAKE 1/2 TO 1 TABLET BY MOUTH AT BEDTIME AS NEEDED FOR SLEEP ?  ?levothyroxine 75 MCG tablet ?Commonly known as: SYNTHROID ?TAKE 1 TABLET(75 MCG) BY MOUTH DAILY ?  ?liothyronine 5 MCG tablet ?Commonly known as: CYTOMEL ?Take 1 tablet (5 mcg total) by mouth daily. ?  ?loratadine 10 MG tablet ?Commonly known as: CLARITIN ?Take 10 mg by mouth daily. ?  ?methocarbamol 500 MG tablet ?Commonly known as: Robaxin ?Take 1 tablet (500 mg total) by mouth every 6 (six) hours as needed for muscle spasms. ?  ?polyethylene glycol 17 g packet ?Commonly known as: MiraLax ?Take 17 g by mouth every other day. ?  ?sucralfate 1 g tablet ?Commonly known as: Carafate ?Take 1 tablet (1 g total) by mouth every 6 (six) hours as needed. Slowy dissolve 1 tablet in 1 Tablespoon of distilled water to make a slurry before ingesting ?  ?traZODone 50 MG tablet ?Commonly known as: DESYREL ?Take 0.5-1 tablets (25-50 mg total) by mouth at bedtime as needed for sleep. ?  ?venlafaxine XR 75 MG 24 hr capsule ?Commonly known as: EFFEXOR-XR ?TAKE 1 CAPSULE(75 MG) BY MOUTH DAILY WITH BREAKFAST ?  ?vitamin B-12 1000 MCG tablet ?Commonly known as: CYANOCOBALAMIN ?Take 1,000 mcg by mouth daily. ?  ?vitamin k 100 MCG tablet ?Take 100 mcg by mouth daily. ?   ? ?  ? ? ? ? ?OBJECTIVE:  ? ?PHYSICAL EXAM: ?VS: BP 120/70 (BP Location: Left Arm, Patient Position: Sitting, Cuff Size: Small)   Pulse 60   Ht 5' 11"  (1.803 m)   Wt 168 lb (76.2 kg)   SpO2 98%   BMI 23.43 kg/m?  ? ? ?EXAM: ?General: Pt appears well and is in NAD  ?Neck: General: Supple without adenopathy. ?Thyroid: Thyroid size normal.  No  goiter or nodules appreciated.  ?Lungs: Clear with good BS bilat with no rales, rhonchi, or wheezes  ?Heart: Auscultation: RRR.  ?Abdomen: Normoactive bowel sounds, soft, nontender, without masses or organomegaly palpable  ?Extremities:  ?BL LE: No pretibial edema normal ROM and strength.  ?Mental Status: Judgment, insight: Intact ?Orientation: Oriented to time, place, and person ?Mood and affect: No depression, anxiety, or agitation  ? ? ? ?DATA REVIEWED: ? ? Latest Reference Range & Units 09/13/21 09:06  ?Sodium 135 - 145 mEq/L 138  ?Potassium 3.5 - 5.1 mEq/L 4.3  ?Chloride 96 - 112 mEq/L 101  ?CO2 19 - 32 mEq/L 29  ?Glucose 70 - 99 mg/dL 76  ?BUN 6 - 23 mg/dL 14  ?Creatinine 0.40 - 1.20 mg/dL 1.28 (H)  ?Calcium 8.4 - 10.5 mg/dL 10.1  ?GFR >60.00 mL/min 46.68 (L)  ? ? Latest Reference Range & Units 09/13/21 09:06  ?VITD 30.00 - 100.00 ng/mL 84.65  ?Vitamin B12 211 - 911 pg/mL 1,409 (H)  ?WBC 4.0 - 10.5 K/uL 4.0  ?RBC 3.87 - 5.11 Mil/uL 4.17  ?Hemoglobin 12.0 - 15.0 g/dL 13.3  ?HCT 36.0 - 46.0 % 39.0  ?MCV 78.0 - 100.0 fl 93.7  ?MCHC 30.0 - 36.0 g/dL 34.1  ?RDW 11.5 - 15.5 % 12.5  ?Platelets 150.0 - 400.0 K/uL 238.0  ? ? Latest Reference Range & Units 09/13/21 09:06  ?TSH 0.35 - 5.50 uIU/mL 0.97  ? ? ?ASSESSMENT / PLAN / RECOMMENDATIONS:  ? ?Hypothyroidism : ? ?- She has been noted with fatigue - We discussed adding T3.  ?- She was on NP thyroid for a long time which contains T4 and T3 but this had to be switched to Levothyroxine in 05/2020 due to insurance issues.  ?- I prescribed LT-3 replacement but she does not recall being on it, will refill and she will try it for at  least 2 months  ? ? ?Medications  ? ?Continue Levothyroxine 75 mcg daily  ?Start Liothyronine 5 mcg daily  ? ?2. Fatigue : ? ?- No evidence of anemia  ?-Vitamin D and B12 acceptable ? ?F/U in 6 months  ?Labs in 8

## 2021-09-14 ENCOUNTER — Encounter: Payer: Self-pay | Admitting: Internal Medicine

## 2021-09-14 MED ORDER — LEVOTHYROXINE SODIUM 75 MCG PO TABS: 75.0000 ug | ORAL_TABLET | Freq: Every day | ORAL | 3 refills | Status: DC

## 2021-09-19 ENCOUNTER — Ambulatory Visit (HOSPITAL_COMMUNITY): Admit: 2021-09-19 | Payer: 59 | Admitting: Gastroenterology

## 2021-09-19 ENCOUNTER — Encounter (HOSPITAL_COMMUNITY): Payer: Self-pay

## 2021-09-19 SURGERY — MONITORING, ESOPHAGEAL PH, 24 HOUR
Anesthesia: Choice

## 2021-10-04 ENCOUNTER — Other Ambulatory Visit: Payer: Self-pay | Admitting: Registered Nurse

## 2021-10-04 DIAGNOSIS — G479 Sleep disorder, unspecified: Secondary | ICD-10-CM

## 2021-10-06 ENCOUNTER — Encounter: Payer: Self-pay | Admitting: Registered Nurse

## 2021-10-25 ENCOUNTER — Other Ambulatory Visit: Payer: Self-pay

## 2021-10-25 ENCOUNTER — Encounter (HOSPITAL_COMMUNITY): Payer: Self-pay | Admitting: *Deleted

## 2021-10-25 ENCOUNTER — Emergency Department (HOSPITAL_COMMUNITY): Payer: 59

## 2021-10-25 ENCOUNTER — Emergency Department (HOSPITAL_COMMUNITY)
Admission: EM | Admit: 2021-10-25 | Discharge: 2021-10-25 | Disposition: A | Discharge status: Home / Self Care | Payer: 59 | Attending: Emergency Medicine | Admitting: Emergency Medicine

## 2021-10-25 ENCOUNTER — Telehealth: Payer: Self-pay

## 2021-10-25 DIAGNOSIS — K8070 Calculus of gallbladder and bile duct without cholecystitis without obstruction: Secondary | ICD-10-CM | POA: Insufficient documentation

## 2021-10-25 DIAGNOSIS — R109 Unspecified abdominal pain: Secondary | ICD-10-CM | POA: Diagnosis not present

## 2021-10-25 DIAGNOSIS — K8020 Calculus of gallbladder without cholecystitis without obstruction: Secondary | ICD-10-CM | POA: Diagnosis not present

## 2021-10-25 DIAGNOSIS — R1011 Right upper quadrant pain: Secondary | ICD-10-CM | POA: Diagnosis not present

## 2021-10-25 DIAGNOSIS — K8050 Calculus of bile duct without cholangitis or cholecystitis without obstruction: Secondary | ICD-10-CM

## 2021-10-25 LAB — COMPREHENSIVE METABOLIC PANEL
ALT: 33 U/L (ref 0–44)
AST: 38 U/L (ref 15–41)
Albumin: 4.5 g/dL (ref 3.5–5.0)
Alkaline Phosphatase: 83 U/L (ref 38–126)
Anion gap: 7 (ref 5–15)
BUN: 11 mg/dL (ref 6–20)
CO2: 28 mmol/L (ref 22–32)
Calcium: 9.7 mg/dL (ref 8.9–10.3)
Chloride: 103 mmol/L (ref 98–111)
Creatinine, Ser: 1.19 mg/dL — ABNORMAL HIGH (ref 0.44–1.00)
GFR, Estimated: 53 mL/min — ABNORMAL LOW (ref >60)
Glucose, Bld: 89 mg/dL (ref 70–99)
Potassium: 3.7 mmol/L (ref 3.5–5.1)
Sodium: 138 mmol/L (ref 135–145)
Total Bilirubin: 0.8 mg/dL (ref 0.3–1.2)
Total Protein: 7.5 g/dL (ref 6.5–8.1)

## 2021-10-25 LAB — URINALYSIS, ROUTINE W REFLEX MICROSCOPIC
Bilirubin Urine: NEGATIVE
Glucose, UA: NEGATIVE mg/dL
Hgb urine dipstick: NEGATIVE
Ketones, ur: NEGATIVE mg/dL
Leukocytes,Ua: NEGATIVE
Nitrite: NEGATIVE
Protein, ur: NEGATIVE mg/dL
Specific Gravity, Urine: 1.003 — ABNORMAL LOW (ref 1.005–1.030)
pH: 8 (ref 5.0–8.0)

## 2021-10-25 LAB — CBC
HCT: 39.9 % (ref 36.0–46.0)
Hemoglobin: 13.2 g/dL (ref 12.0–15.0)
MCH: 31.5 pg (ref 26.0–34.0)
MCHC: 33.1 g/dL (ref 30.0–36.0)
MCV: 95.2 fL (ref 80.0–100.0)
Platelets: 227 10*3/uL (ref 150–400)
RBC: 4.19 MIL/uL (ref 3.87–5.11)
RDW: 12 % (ref 11.5–15.5)
WBC: 5.7 10*3/uL (ref 4.0–10.5)
nRBC: 0 % (ref 0.0–0.2)

## 2021-10-25 LAB — LIPASE, BLOOD: Lipase: 47 U/L (ref 11–51)

## 2021-10-25 LAB — PREGNANCY, URINE: Preg Test, Ur: NEGATIVE

## 2021-10-25 MED ORDER — HYDROMORPHONE HCL 1 MG/ML IJ SOLN
0.5000 mg | Freq: Once | INTRAMUSCULAR | Status: AC
  Administered 2021-10-25: 0.5 mg via INTRAVENOUS
  Filled 2021-10-25: qty 0.5

## 2021-10-25 MED ORDER — ONDANSETRON HCL 4 MG PO TABS: 4.0000 mg | ORAL_TABLET | Freq: Four times a day (QID) | ORAL | 0 refills | Status: DC

## 2021-10-25 MED ORDER — OXYCODONE-ACETAMINOPHEN 5-325 MG PO TABS
1.0000 | ORAL_TABLET | Freq: Three times a day (TID) | ORAL | 0 refills | Status: AC | PRN
Start: 2021-10-25 — End: 2021-10-28

## 2021-10-25 MED ORDER — ONDANSETRON HCL 4 MG/2ML IJ SOLN
4.0000 mg | Freq: Once | INTRAMUSCULAR | Status: AC
  Administered 2021-10-25: 4 mg via INTRAVENOUS
  Filled 2021-10-25: qty 2

## 2021-10-25 MED ORDER — SODIUM CHLORIDE 0.9 % IV BOLUS
1000.0000 mL | Freq: Once | INTRAVENOUS | Status: AC
  Administered 2021-10-25: 1000 mL via INTRAVENOUS

## 2021-10-25 NOTE — Discharge Instructions (Addendum)
Likely your stomach pain is from a gallstone in your gallbladder, I have given you Zofran for nausea and pain medication.  Please take as prescribed.  Given you information how to decrease gallbladder attacks please review ? ?I have given you a short course of narcotics please take as prescribed.  This medication can make you drowsy do not consume alcohol or operate heavy machinery when taking this medication.  This medication is Tylenol in it do not take Tylenol and take this medication.   ? ? ?I want you to follow-up with general surgery for further evaluation ? ?If you have severe stomach pain nausea and vomiting despite using the pain and nausea medication you must come back in for reevaluation. ?

## 2021-10-25 NOTE — ED Notes (Signed)
Pt transported to [facility]

## 2021-10-25 NOTE — ED Provider Notes (Signed)
?[facility] ?Provider Note ? ? ?CSN: [id number] ?Arrival date & time: 10/25/21  1503 ? ?  ? ?History ? ?Chief Complaint  ?Patient presents with  ? Abdominal Pain  ? ? ?Candace Allen is a 56 y.o. female. ? ?HPI ? ?With medical history including IBS, prediabetes, diverticulosis presents emerged part with complaints of right-sided abdominal pain.  Patient's pain started suddenly, states she feels pain on her right side, does not radiate, she has no associated nausea or vomiting, denies any constipation diarrhea, last bowel movement was today, she states that she has been urinating more frequently but denies dysuria hematuria.  Patient's had a tubal ligation but no other significant abdominal history denies excessive NSAID use alcohol use no history of kidney stones, pancreatitis, stomach ulcers.  She has no other complaints at this time. ? ? ? ?Home Medications ?Prior to Admission medications   ?Medication Sig Start Date End Date Taking? Authorizing Provider  ?ondansetron (ZOFRAN) 4 MG tablet Take 1 tablet (4 mg total) by mouth every 6 (six) hours. 10/25/21  Yes Marcello Fennel, PA-C  ?oxyCODONE-acetaminophen (PERCOCET/ROXICET) 5-325 MG tablet Take 1 tablet by mouth every 8 (eight) hours as needed for up to 3 days for severe pain. 10/25/21 10/28/21 Yes Marcello Fennel, PA-C  ?Alum Hydroxide-Mag Trisilicate (GAVISCON) 70-96.2 MG CHEW Use as directed 05/21/21   Armbruster, Carlota Raspberry, MD  ?EPINEPHrine 0.3 mg/0.3 mL IJ SOAJ injection Inject 0.3 mg into the muscle as needed for anaphylaxis. 12/18/20   Fredia Sorrow, MD  ?famotidine (PEPCID) 40 MG tablet Take 1 tablet (40 mg total) by mouth 2 (two) times daily. 02/14/21   Levin Erp, PA  ?GI Cocktail (alum & mag hydroxide, lidocaine, dicyclomine) oral mixture 90 ml 2 % Lidocaine, 90 ml Dicyclomine 63m/5ml, 270 ml Maalox - Take 5-10 ml every 4-6 hours as needed. 02/14/21   LLevin Erp PA  ?hydrOXYzine (ATARAX) 10 MG tablet TAKE  1/2 TO 1 TABLET BY MOUTH AT BEDTIME AS NEEDED FOR SLEEP 10/04/21   MMaximiano Coss NP  ?levothyroxine (SYNTHROID) 75 MCG tablet Take 1 tablet (75 mcg total) by mouth daily before breakfast. 09/14/21   Shamleffer, IMelanie Crazier MD  ?liothyronine (CYTOMEL) 5 MCG tablet Take 1 tablet (5 mcg total) by mouth daily. 09/13/21   Shamleffer, IMelanie Crazier MD  ?loratadine (CLARITIN) 10 MG tablet Take 10 mg by mouth daily.    [provider]  ?methocarbamol (ROBAXIN) 500 MG tablet Take 1 tablet (500 mg total) by mouth every 6 (six) hours as needed for muscle spasms. 10/23/20   MMaximiano Coss NP  ?methylcellulose (CITRUCEL) oral powder Use as directed Daily 05/21/21   Armbruster, SCarlota Raspberry MD  ?polyethylene glycol (MIRALAX) 17 g packet Take 17 g by mouth every other day. 11/02/19   Armbruster, SCarlota Raspberry MD  ?sucralfate (CARAFATE) 1 g tablet Take 1 tablet (1 g total) by mouth every 6 (six) hours as needed. Slowy dissolve 1 tablet in 1 Tablespoon of distilled water to make a slurry before ingesting 11/02/19   Armbruster, SCarlota Raspberry MD  ?traZODone (DESYREL) 50 MG tablet Take 0.5-1 tablets (25-50 mg total) by mouth at bedtime as needed for sleep. 03/16/21   MMaximiano Coss NP  ?venlafaxine XR (EFFEXOR-XR) 75 MG 24 hr capsule TAKE 1 CAPSULE(75 MG) BY MOUTH DAILY WITH BREAKFAST 08/16/21   ANunzio Cobbs MD  ?vitamin B-12 (CYANOCOBALAMIN) 1000 MCG tablet Take 1,000 mcg by mouth daily.    [provider]  ?vitamin  k 100 MCG tablet Take 100 mcg by mouth daily.    [provider]  ?   ? ?Allergies    ?Bee venom, Flagyl [metronidazole], Iodine, Shellfish allergy, Tramadol hcl, Gluten meal, and Lactose intolerance (gi)   ? ?Review of Systems   ?Review of Systems  ?Constitutional:  Negative for chills and fever.  ?Respiratory:  Negative for shortness of breath.   ?Cardiovascular:  Negative for chest pain.  ?Gastrointestinal:  Positive for abdominal pain. Negative for diarrhea, nausea and vomiting.   ?Genitourinary:  Positive for flank pain and frequency. Negative for dysuria, vaginal bleeding and vaginal discharge.  ?Neurological:  Negative for headaches.  ? ?Physical Exam ?Updated Vital Signs ?BP (!) 153/95 (BP Location: Right Arm)   Pulse 82   Temp 98 ?F (36.7 ?C) (Oral)   Resp 19   Ht 5' 10"  (1.778 m)   Wt 76.2 kg   SpO2 100%   BMI 24.11 kg/m?  ?Physical Exam ?Vitals and nursing note reviewed.  ?Constitutional:   ?   General: She is not in acute distress. ?   Appearance: She is not ill-appearing.  ?HENT:  ?   Head: Normocephalic and atraumatic.  ?   Nose: No congestion.  ?Eyes:  ?   Conjunctiva/sclera: Conjunctivae normal.  ?Cardiovascular:  ?   Rate and Rhythm: Normal rate and regular rhythm.  ?   Pulses: Normal pulses.  ?   Heart sounds: No murmur heard. ?  No friction rub. No gallop.  ?Pulmonary:  ?   Effort: No respiratory distress.  ?   Breath sounds: No wheezing, rhonchi or rales.  ?Abdominal:  ?   Palpations: Abdomen is soft.  ?   Tenderness: There is abdominal tenderness. There is no right CVA tenderness or left CVA tenderness.  ?   Comments: Abdomen nondistended note bowel sounds dull to percussion, has noted right upper quadrant tenderness, and right sided tenderness, there is no guarding rebound as or peritoneal sign positive Murphy sign negative CVA tenderness.  ?Musculoskeletal:  ?   Right lower leg: No edema.  ?   Left lower leg: No edema.  ?Skin: ?   General: Skin is warm and dry.  ?Neurological:  ?   Mental Status: She is alert.  ?Psychiatric:     ?   Mood and Affect: Mood normal.  ? ? ?ED Results / Procedures / Treatments   ?Labs ?(all labs ordered are listed, but only abnormal results are displayed) ?Labs Reviewed  ?COMPREHENSIVE METABOLIC PANEL - Abnormal; Notable for the following components:  ?    Result Value  ? Creatinine, Ser 1.19 (*)   ? GFR, Estimated 53 (*)   ? All other components within normal limits  ?URINALYSIS, ROUTINE W REFLEX MICROSCOPIC - Abnormal; Notable for the  following components:  ? Color, Urine COLORLESS (*)   ? Specific Gravity, Urine 1.003 (*)   ? All other components within normal limits  ?LIPASE, BLOOD  ?CBC  ?PREGNANCY, URINE  ?POC URINE PREG, ED  ? ? ?EKG ?None ? ?Radiology ?US Abdomen Limited ? ?Result Date: 10/25/2021 ?CLINICAL DATA:  RIGHT upper quadrant pain. EXAM: ULTRASOUND ABDOMEN LIMITED RIGHT UPPER QUADRANT COMPARISON:  None available. FINDINGS: Gallbladder: Large gallstones in the gallbladder largest 2.2 cm. No reported tenderness over the gallbladder. Wall thickness difficult to assess given patient body habitus may be as much as 4 mm which is thickened. No visible pericholecystic fluid but adjacent bowel gas does limit assessment and shadowing from the large calculus in  the fundus limits assessment of posterior wall. Common bile duct: Diameter: 3.7 mm Liver: No frank increased parenchymal echogenicity. Very limited assessment of hepatic parenchyma in general due to patient body habitus. Portal vein is patent on color Doppler imaging with normal direction of blood flow towards the liver. Other: None. IMPRESSION: Cholelithiasis with large gallstones and with signs of potential gallbladder wall thickening, difficult to assess given patient body habitus. If there is high clinical suspicion for acute biliary process CT may be helpful for further evaluation. Currently there is no reported tenderness over the gallbladder or signs of pericholecystic fluid. No biliary duct dilation. Overall limited exam. Electronically Signed   By: Zetta Bills M.D.   On: 10/25/2021 16:39   ? ?Procedures ?Procedures  ? ? ?Medications Ordered in ED ?Medications  ?sodium chloride 0.9 % bolus 1,000 mL (1,000 mLs Intravenous New Bag/Given 10/25/21 1636)  ?HYDROmorphone (DILAUDID) injection 0.5 mg (0.5 mg Intravenous Given 10/25/21 1638)  ?ondansetron [facility]) injection 4 mg (4 mg Intravenous Given 10/25/21 1638)  ? ? ?ED Course/ Medical Decision Making/ A&P ?  ?                         ?Medical Decision Making ?Amount and/or Complexity of Data Reviewed ?Labs: ordered. ?Radiology: ordered. ? ?Risk ?Prescription drug management. ? ? ?This patient presents to the ED for concern of right-sided pain

## 2021-10-25 NOTE — ED Triage Notes (Signed)
Pt c/o severe right side abdominal pain; pt states she has been urinating more but states she has been trying to drink more; pt denies any n/v/d ?

## 2021-10-25 NOTE — Telephone Encounter (Signed)
Nurse: Fredderick Phenix, RN, Lelan Pons Date/Time Eilene Ghazi Time): 10/25/2021 2:36:04 PM ?Confirm and document reason for call. If symptomatic, describe symptoms. ?---Caller states she has sharp pain in her RUQ abdominal pain for 3 days. Intermittent and immobilizing 10/10. Abdomen is a little distended also. ? ?Does the patient have any new or worsening symptoms? ---Yes ?Will a triage be completed? ---Yes ?Related visit to physician within the last 2 weeks? ---No ?Does the PT have any chronic conditions? (i.e. diabetes, asthma, this includes High risk factors for pregnancy, etc.) ---Yes ?List chronic conditions. ---allergies, IBS ?Is this a behavioral health or substance abuse call? ---No ? ?abdominal Pain - Upper ?[1] Pain lasts > 10 minutes ?[2] age > 76 ?Fredderick Phenix RN, Lelan Pons 10/25/2021 2:38:41 PM ? ?10/25/2021 2:33:29 PM Send to Urgent Queue Schaldach, Rachael ?10/25/2021 2:41:39 PM Go to ED Now Yes Fredderick Phenix, RN, Lelan Pons ? ?

## 2021-10-25 NOTE — ED Provider Notes (Signed)
Patient care taken over at shift change.  In short patient presents due to abdominal pain. ? ?Complete history below. ?  ?With medical history including IBS, prediabetes, diverticulosis presents emerged part with complaints of right-sided abdominal pain.  Patient's pain started suddenly, states she feels pain on her right side, does not radiate, she has no associated nausea or vomiting, denies any constipation diarrhea, last bowel movement was today, she states that she has been urinating more frequently but denies dysuria hematuria.  Patient's had a tubal ligation but no other significant abdominal history denies excessive NSAID use alcohol use no history of kidney stones, pancreatitis, stomach ulcers.  She has no other complaints at this time. ?Physical Exam  ?BP (!) 152/102   Pulse 68   Temp 97.9 ?F (36.6 ?C) (Oral)   Resp 18   Ht 5' 10"  (1.778 m)   Wt 76.2 kg   SpO2 100%   BMI 24.11 kg/m?  ? ?Physical Exam ?Vitals and nursing note reviewed. Exam conducted with a chaperone present.  ?Constitutional:   ?   General: She is not in acute distress. ?   Appearance: Normal appearance.  ?HENT:  ?   Head: Normocephalic and atraumatic.  ?Eyes:  ?   General: No scleral icterus. ?   Extraocular Movements: Extraocular movements intact.  ?   Pupils: Pupils are equal, round, and reactive to light.  ?Abdominal:  ?   Tenderness: There is abdominal tenderness in the right upper quadrant.  ?Skin: ?   Coloration: Skin is not jaundiced.  ?Neurological:  ?   Mental Status: She is alert. Mental status is at baseline.  ?   Coordination: Coordination normal.  ? ? ?Procedures  ?Procedures ? ?ED Course / MDM  ?  ?Medical Decision Making ?Amount and/or Complexity of Data Reviewed ?Labs: ordered. ?Radiology: ordered. ? ?Risk ?Prescription drug management. ? ? ?Patient care signed out to me pending UA.  UA came back unremarkable, no hematuria.  Do not see reason for CT renal study at this time.  Given pain is upper I do not think this  is appendicitis.  Ultrasound notable for cholelithiasis without cholecystitis signs, most consistent with Coley lithiasis.  Discussed with patient, she will follow-up with general surgery and PCP.  Return precautions discussed, discharged in stable condition. ? ? ?  ?Sherrill Raring, PA-C ?10/25/21 2311 ? ?  ?Milton Ferguson, MD ?10/26/21 1055 ? ?

## 2021-10-26 NOTE — Telephone Encounter (Signed)
Called pt and scheduled ED follow up app for Monday  ?

## 2021-10-29 ENCOUNTER — Ambulatory Visit (INDEPENDENT_AMBULATORY_CARE_PROVIDER_SITE_OTHER): Payer: 59 | Admitting: Registered Nurse

## 2021-10-29 ENCOUNTER — Encounter: Payer: Self-pay | Admitting: Registered Nurse

## 2021-10-29 VITALS — BP 134/78 | HR 80 | Temp 98.2°F | Resp 18 | Ht 70.0 in | Wt 167.2 lb

## 2021-10-29 DIAGNOSIS — R1011 Right upper quadrant pain: Secondary | ICD-10-CM

## 2021-10-29 MED ORDER — OXYCODONE HCL 5 MG PO CAPS
5.0000 mg | ORAL_CAPSULE | ORAL | 0 refills | Status: DC | PRN
Start: 2021-10-29 — End: 2021-11-08

## 2021-10-29 MED ORDER — CELECOXIB 200 MG PO CAPS: 200.0000 mg | ORAL_CAPSULE | Freq: Two times a day (BID) | ORAL | 0 refills | Status: DC

## 2021-10-29 NOTE — Progress Notes (Signed)
? ?Established Patient Office Visit ? ?Subjective:  ?Patient ID: Candace Allen, female    DOB: 1965/10/08  Age: 56 y.o. MRN: [id number] ? ?CC:  ?Chief Complaint  ?Patient presents with  ? Follow-up  ?  Patient states she is here to follow up on ER visit . Patient has gallstones.  ? ? ?HPI ?Candace Allen presents for ER follow up ? ?Seen on 10/25/21 at [facility]. ?Had acute onset R abdominal pain. No assoc nvd. Hx of ibs, prediabetes, diverticulosis. No hx of tubal ligation, renal stones, pancreatitis, PUD.  ?RUQ tenderness.  ?Labs showed renal function mildly low but stable from previous readings, at baseline.  ?[place] RUQ showed cholelithiasis with large gallstones, potential gallbladder wall thickening.  ?Pain improved with opioids. ? ?Does have ongoing pain in RUQ wrapping towards R flank.  ?Does note baseline ongoing nausea. IBS is at baseline.  ?No bowel discoloration, lightheadedness, LOC, jaundice. ?Still voiding urine, clearer than she'd typically expect.  ? ?Outpatient Medications Prior to Visit  ?Medication Sig Dispense Refill  ? Alum Hydroxide-Mag Trisilicate (GAVISCON) 10-62.6 MG CHEW Use as directed 224 tablet   ? EPINEPHrine 0.3 mg/0.3 mL IJ SOAJ injection Inject 0.3 mg into the muscle as needed for anaphylaxis. 2 each 0  ? famotidine (PEPCID) 40 MG tablet Take 1 tablet (40 mg total) by mouth 2 (two) times daily. 60 tablet 5  ? GI Cocktail (alum & mag hydroxide, lidocaine, dicyclomine) oral mixture 90 ml 2 % Lidocaine, 90 ml Dicyclomine 73m/5ml, 270 ml Maalox - Take 5-10 ml every 4-6 hours as needed. 450 mL 1  ? hydrOXYzine (ATARAX) 10 MG tablet TAKE 1/2 TO 1 TABLET BY MOUTH AT BEDTIME AS NEEDED FOR SLEEP 90 tablet 2  ? levothyroxine (SYNTHROID) 75 MCG tablet Take 1 tablet (75 mcg total) by mouth daily before breakfast. 90 tablet 3  ? liothyronine (CYTOMEL) 5 MCG tablet Take 1 tablet (5 mcg total) by mouth daily. 90 tablet 3  ? loratadine (CLARITIN) 10 MG tablet Take 10 mg by mouth daily.    ?  methocarbamol (ROBAXIN) 500 MG tablet Take 1 tablet (500 mg total) by mouth every 6 (six) hours as needed for muscle spasms. 60 tablet 0  ? methylcellulose (CITRUCEL) oral powder Use as directed Daily    ? ondansetron (ZOFRAN) 4 MG tablet Take 1 tablet (4 mg total) by mouth every 6 (six) hours. 12 tablet 0  ? polyethylene glycol (MIRALAX) 17 g packet Take 17 g by mouth every other day. 14 each 0  ? sucralfate (CARAFATE) 1 g tablet Take 1 tablet (1 g total) by mouth every 6 (six) hours as needed. Slowy dissolve 1 tablet in 1 Tablespoon of distilled water to make a slurry before ingesting 90 tablet 2  ? traZODone (DESYREL) 50 MG tablet Take 0.5-1 tablets (25-50 mg total) by mouth at bedtime as needed for sleep. 30 tablet 3  ? venlafaxine XR (EFFEXOR-XR) 75 MG 24 hr capsule TAKE 1 CAPSULE(75 MG) BY MOUTH DAILY WITH BREAKFAST 90 capsule 2  ? vitamin B-12 (CYANOCOBALAMIN) 1000 MCG tablet Take 1,000 mcg by mouth daily.    ? vitamin k 100 MCG tablet Take 100 mcg by mouth daily.    ? ?Facility-Administered Medications Prior to Visit  ?Medication Dose Route Frequency Provider Last Rate Last Admin  ? 0.9 %  sodium chloride infusion  500 mL Intravenous Once Armbruster, SCarlota Raspberry MD      ? dextrose 5 % solution   Intravenous Continuous Armbruster,  Carlota Raspberry, MD      ? ? ?Review of Systems  ?Constitutional: Negative.   ?HENT: Negative.    ?Eyes: Negative.   ?Respiratory: Negative.    ?Cardiovascular: Negative.   ?Gastrointestinal:  Positive for abdominal pain. Negative for abdominal distention, anal bleeding, blood in stool, constipation, diarrhea, nausea, rectal pain and vomiting.  ?Endocrine: Negative.   ?Genitourinary: Negative.   ?Musculoskeletal: Negative.   ?Skin: Negative.   ?Allergic/Immunologic: Negative.   ?Neurological: Negative.   ?Hematological: Negative.   ?Psychiatric/Behavioral: Negative.    ?All other systems reviewed and are negative. ? ?  ?Objective:  ?  ? ?BP 134/78   Pulse 80   Temp 98.2 ?F (36.8 ?C)  (Temporal)   Resp 18   Ht 5' 10"  (1.778 m)   Wt 167 lb 3.2 oz (75.8 kg)   SpO2 98%   BMI 23.99 kg/m?  ? ?Wt Readings from Last 3 Encounters:  ?10/29/21 167 lb 3.2 oz (75.8 kg)  ?10/25/21 168 lb (76.2 kg)  ?09/13/21 168 lb (76.2 kg)  ? ?Physical Exam ?Vitals and nursing note reviewed.  ?Constitutional:   ?   General: She is not in acute distress. ?   Appearance: Normal appearance. She is normal weight. She is not ill-appearing, toxic-appearing or diaphoretic.  ?Cardiovascular:  ?   Rate and Rhythm: Normal rate and regular rhythm.  ?   Heart sounds: Normal heart sounds. No murmur heard. ?  No friction rub. No gallop.  ?Pulmonary:  ?   Effort: Pulmonary effort is normal. No respiratory distress.  ?   Breath sounds: Normal breath sounds. No stridor. No wheezing, rhonchi or rales.  ?Chest:  ?   Chest wall: No tenderness.  ?Skin: ?   General: Skin is warm and dry.  ?Neurological:  ?   General: No focal deficit present.  ?   Mental Status: She is alert and oriented to person, place, and time. Mental status is at baseline.  ?Psychiatric:     ?   Mood and Affect: Mood normal.     ?   Behavior: Behavior normal.     ?   Thought Content: Thought content normal.     ?   Judgment: Judgment normal.  ? ? ?No results found for any visits on 10/29/21. ? ? ? ?The 10-year ASCVD risk score (Arnett DK, et al., 2019) is: 2.1% ? ?  ?Assessment & Plan:  ? ?Problem List Items Addressed This Visit   ?None ?Visit Diagnoses   ? ? RUQ pain    -  Primary  ? Relevant Medications  ? celecoxib (CELEBREX) 200 MG capsule  ? oxycodone (OXY-IR) 5 MG capsule  ? ?  ? ? ?Meds ordered this encounter  ?Medications  ? celecoxib (CELEBREX) 200 MG capsule  ?  Sig: Take 1 capsule (200 mg total) by mouth 2 (two) times daily.  ?  Dispense:  60 capsule  ?  Refill:  0  ?  Order Specific Question:   Supervising Provider  ?  Answer:   Carlota Raspberry, JEFFREY R [[id number]]  ? oxycodone (OXY-IR) 5 MG capsule  ?  Sig: Take 1 capsule (5 mg total) by mouth every 4 (four) hours as  needed.  ?  Dispense:  30 capsule  ?  Refill:  0  ?  Order Specific Question:   Supervising Provider  ?  Answer:   Carlota Raspberry, JEFFREY R [[id number]]  ? ? ?Return if symptoms worsen or fail to improve.  ? ?PLAN ?Try celebrex and  oxycodone for pain ?She will see surgery tomorrow to discuss options ?Patient encouraged to call clinic with any questions, comments, or concerns. ? ? ?Maximiano Coss, NP ?

## 2021-10-29 NOTE — Patient Instructions (Addendum)
Candace Allen -  ? ?Great to see you ? ?Sorry this is happening ? ?Celebrex twice daily, oxycodone as needed for pain ?Use with caution ? ?Bland diet, plenty of fluids, nothing high in fat. ? ?Thanks, ? ?Rich  ? ? ? ?If you have lab work done today you will be contacted with your lab results within the next 2 weeks.  If you have not heard from [place] then please contact us. The fastest way to get your results is to register for My Chart. ? ? ?IF you received an x-ray today, you will receive an invoice from [facility]. Please contact [facility] at [phone] with questions or concerns regarding your invoice.  ? ?IF you received labwork today, you will receive an invoice from [place]. Please contact [facility] at [phone] with questions or concerns regarding your invoice.  ? ?Our billing staff will not be able to assist you with questions regarding bills from these companies. ? ?You will be contacted with the lab results as soon as they are available. The fastest way to get your results is to activate your My Chart account. Instructions are located on the last page of this paperwork. If you have not heard from [place] regarding the results in 2 weeks, please contact this office. ?  ? ? ?

## 2021-10-30 ENCOUNTER — Ambulatory Visit: Payer: 59 | Admitting: Surgery

## 2021-10-30 ENCOUNTER — Encounter: Payer: Self-pay | Admitting: Surgery

## 2021-10-30 VITALS — BP 118/80 | HR 80 | Temp 98.5°F | Resp 14 | Ht 70.0 in | Wt 165.0 lb

## 2021-10-30 DIAGNOSIS — K8020 Calculus of gallbladder without cholecystitis without obstruction: Secondary | ICD-10-CM

## 2021-10-31 NOTE — Progress Notes (Signed)
[facility] History and Physical  Reason for Referral: Cholelithiasis Referring Physician: Deno Etienne, PA-C  Chief Complaint   New Patient (Initial Visit)     Candace Allen is a 56 y.o. female.  HPI: Patient presents for evaluation of cholelithiasis.  She has been having abdominal weight pain for the last couple of weeks.  It started about 3 days prior to her presenting to the emergency department.  She was evaluated in the ED, and underwent abdominal ultrasound, which demonstrated cholelithiasis and possible gallbladder wall thickening with negative sonographic Murphy's and no pericholecystic fluid.  Her blood work was all within normal limits.  She was advised to follow-up with me in the office.  Since discharge from the ER, she has had 1 episode of increasing abdominal pain.  She has nausea associated without episodes of emesis.  Her pain is in the right side of her abdomen and in her right upper quadrant.  She does have history of GERD and a hiatal hernia.  Her surgical history is significant for a tubal ligation and uterine ablation.  She denies use of blood thinning medications.  She denies use of tobacco products and illicit drugs.  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  Past Medical History:  Diagnosis Date   Diverticulosis 02/19/2018   By colonoscopy 2016   Fibroid    GERD (gastroesophageal reflux disease)    Hx of concussion    IBS (irritable bowel syndrome)    Iron excess 2022   Kidney disease    Stage III--due to medication dexilant   Leukopenia 2022   Prediabetes    Thyroid disease     Past Surgical History:  Procedure Laterality Date   COLONOSCOPY  2016   ENDOMETRIAL ABLATION     SHOULDER SURGERY Left    TUBAL LIGATION     UPPER GASTROINTESTINAL ENDOSCOPY  11/10/2019    Family History  Problem Relation Age of Onset   Peripheral Artery Disease Mother    Other Father        Hx unknown to patient    Testicular cancer Brother    Esophageal cancer Brother    Hypotension Maternal Grandmother    Aortic dissection Maternal Grandmother    Heart disease Maternal Grandfather    Diabetes Paternal Grandmother        AODM   Diabetes Paternal Grandfather        AODM   Healthy Daughter    Healthy Son    Ovarian cancer Paternal Great-grandmother     Social History   Tobacco Use   Smoking status: Never    Passive exposure: Never   Smokeless tobacco: Never  Vaping Use   Vaping Use: Never used  Substance Use Topics   Alcohol use: Yes    Alcohol/week: 2.0 standard drinks    Types: 2 Standard drinks or equivalent per week    Comment: social   Drug use: Never    Medications: I have reviewed the patient's current medications. Allergies as of 10/30/2021       Reactions   Bee Venom Anaphylaxis   Flagyl [metronidazole] Anaphylaxis   Iodine Anaphylaxis   Shellfish Allergy Anaphylaxis   Tramadol Hcl Other (See Comments)   Showed signs of stroke   Gluten Meal    Lactose Intolerance (gi)         Medication List        Accurate as of Oct 30, 2021 11:59 PM. If  you have any questions, ask your nurse or doctor.          Adult Gummy Chew Chew 1 capsule by mouth daily.   Alka-Seltzer Heartburn 1940-1000 MG Tbef Generic drug: Sodium Bicarbonate-Citric Acid Take 1-4 tablets by mouth daily as needed (heartburn).   celecoxib 200 MG capsule Commonly known as: CeleBREX Take 1 capsule (200 mg total) by mouth 2 (two) times daily.   Citrucel oral powder Generic drug: methylcellulose Use as directed Daily   EPINEPHrine 0.3 mg/0.3 mL Soaj injection Commonly known as: EPI-PEN Inject 0.3 mg into the muscle as needed for anaphylaxis.   famotidine 40 MG tablet Commonly known as: Pepcid Take 1 tablet (40 mg total) by mouth 2 (two) times daily.   Gaviscon 80-14.2 MG Chew Generic drug: Alum Hydroxide-Mag Trisilicate Use as directed   GI Cocktail (alum & mag hydroxide, lidocaine,  dicyclomine) oral mixture 90 ml 2 % Lidocaine, 90 ml Dicyclomine 30m/5ml, 270 ml Maalox - Take 5-10 ml every 4-6 hours as needed.   hydrOXYzine 10 MG tablet Commonly known as: ATARAX TAKE 1/2 TO 1 TABLET BY MOUTH AT BEDTIME AS NEEDED FOR SLEEP   levothyroxine 75 MCG tablet Commonly known as: SYNTHROID Take 1 tablet (75 mcg total) by mouth daily before breakfast.   liothyronine 5 MCG tablet Commonly known as: CYTOMEL Take 1 tablet (5 mcg total) by mouth daily.   loratadine 10 MG tablet Commonly known as: CLARITIN Take 10 mg by mouth daily.   methocarbamol 500 MG tablet Commonly known as: Robaxin Take 1 tablet (500 mg total) by mouth every 6 (six) hours as needed for muscle spasms.   ondansetron 4 MG tablet Commonly known as: ZOFRAN Take 1 tablet (4 mg total) by mouth every 6 (six) hours.   oxycodone 5 MG capsule Commonly known as: OXY-IR Take 1 capsule (5 mg total) by mouth every 4 (four) hours as needed.   polyethylene glycol 17 g packet Commonly known as: MiraLax Take 17 g by mouth every other day.   sucralfate 1 g tablet Commonly known as: Carafate Take 1 tablet (1 g total) by mouth every 6 (six) hours as needed. Slowy dissolve 1 tablet in 1 Tablespoon of distilled water to make a slurry before ingesting   traZODone 50 MG tablet Commonly known as: DESYREL Take 0.5-1 tablets (25-50 mg total) by mouth at bedtime as needed for sleep.   venlafaxine XR 75 MG 24 hr capsule Commonly known as: EFFEXOR-XR TAKE 1 CAPSULE(75 MG) BY MOUTH DAILY WITH BREAKFAST   vitamin B-12 1000 MCG tablet Commonly known as: CYANOCOBALAMIN Take 1,000 mcg by mouth daily.   vitamin k 100 MCG tablet Take 100 mcg by mouth daily.         ROS:  Constitutional: negative for chills, fatigue, and fevers Eyes: negative for visual disturbance and pain Ears, nose, mouth, throat, and face: positive for sinus problems, negative for ear drainage and sore throat Respiratory: negative for cough,  wheezing, and shortness of breath Cardiovascular: negative for chest pain and palpitations Gastrointestinal: positive for abdominal pain, nausea, and reflux symptoms Genitourinary:positive for frequency, negative for dysuria and urinary retention Integument/breast: negative for dryness and rash Hematologic/lymphatic: negative for bleeding and lymphadenopathy Musculoskeletal:positive for back pain, neck pain, and joint pain Neurological: negative for dizziness, tremors, and numbness Endocrine: negative for temperature intolerance  Blood pressure 118/80, pulse 80, temperature 98.5 F (36.9 C), temperature source Oral, resp. rate 14, height 5' 10"  (1.778 m), weight 165 lb (74.8 kg), SpO2 97 %.  Physical Exam Vitals reviewed.  Constitutional:      Appearance: Normal appearance.  Eyes:     Extraocular Movements: Extraocular movements intact.     Pupils: Pupils are equal, round, and reactive to light.  Cardiovascular:     Rate and Rhythm: Normal rate and regular rhythm.  Pulmonary:     Effort: Pulmonary effort is normal.     Breath sounds: Normal breath sounds.  Abdominal:     Comments: Abdomen soft, nondistended, no percussion tenderness, nontender to palpation; no rigidity, guarding, rebound tenderness; negative Murphy's  Musculoskeletal:        General: Normal range of motion.     Cervical back: Normal range of motion.  Skin:    General: Skin is warm and dry.  Neurological:     General: No focal deficit present.     Mental Status: She is alert and oriented to person, place, and time.  Psychiatric:        Mood and Affect: Mood normal.        Behavior: Behavior normal.    Results: Abdominal ultrasound (10/25/2021): IMPRESSION: Cholelithiasis with large gallstones and with signs of potential gallbladder wall thickening, difficult to assess given patient body habitus. If there is high clinical suspicion for acute biliary process CT may be helpful for further evaluation. Currently  there is no reported tenderness over the gallbladder or signs of pericholecystic fluid.   No biliary duct dilation.  LFTs within normal limit and no leukocytosis noted on blood work from 10/25/2021  Assessment & Plan:  Ahmia Colford is a 56 y.o. female who presents for evaluation of cholelithiasis.  -I explained the pathophysiology of cholelithiasis and gallbladder disease, and I also explained why we recommend cholecystectomy -I counseled the patient about the indication, risks and benefits of laparoscopic cholecystectomy.  She understands there is a very small chance for bleeding, infection, injury to normal structures (including common bile duct), conversion to open surgery, persistent symptoms, evolution of postcholecystectomy diarrhea, need for secondary interventions, anesthesia reaction, cardiopulmonary issues and other risks not specifically detailed here. I described the expected recovery, the plan for follow-up and the restrictions during the recovery phase.  All questions were answered. -Patient tentatively scheduled for surgery on 5/25. 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  I explained that she is at risk for DVT if she were to fly on a cross-country flight in the immediate postoperative period.  I advised it would be best for her to wait at least 1 to 2 weeks after her surgery before she flew.  I also explained that if her trip is necessary, we could reschedule surgery versus her being aware of the risk of DVT and making sure to ambulate frequently while on the plane -Information provided on low-fat diet and cholecystectomy  All questions were answered to the satisfaction of the patient and family.  Graciella Freer, DO [facility] [address] [place], [place] [address] [phone] (office)

## 2021-11-01 NOTE — H&P (Addendum)
[facility] History and Physical  Reason for Referral: Cholelithiasis Referring Physician: Deno Etienne, PA-C  Chief Complaint   New Patient (Initial Visit)     Candace Allen is a 56 y.o. female.  HPI: Patient presents for evaluation of cholelithiasis.  She has been having abdominal weight pain for the last couple of weeks.  It started about 3 days prior to her presenting to the emergency department.  She was evaluated in the ED, and underwent abdominal ultrasound, which demonstrated cholelithiasis and possible gallbladder wall thickening with negative sonographic Murphy's and no pericholecystic fluid.  Her blood work was all within normal limits.  She was advised to follow-up with me in the office.  Since discharge from the ER, she has had 1 episode of increasing abdominal pain.  She has nausea associated without episodes of emesis.  Her pain is in the right side of her abdomen and in her right upper quadrant.  She does have history of GERD and a hiatal hernia.  Her surgical history is significant for a tubal ligation and uterine ablation.  She denies use of blood thinning medications.  She denies use of tobacco products and illicit drugs.  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  Past Medical History:  Diagnosis Date   Diverticulosis 02/19/2018   By colonoscopy 2016   Fibroid    GERD (gastroesophageal reflux disease)    Hx of concussion    IBS (irritable bowel syndrome)    Iron excess 2022   Kidney disease    Stage III--due to medication dexilant   Leukopenia 2022   Prediabetes    Thyroid disease     Past Surgical History:  Procedure Laterality Date   COLONOSCOPY  2016   ENDOMETRIAL ABLATION     SHOULDER SURGERY Left    TUBAL LIGATION     UPPER GASTROINTESTINAL ENDOSCOPY  11/10/2019    Family History  Problem Relation Age of Onset   Peripheral Artery Disease Mother    Other Father        Hx unknown to patient    Testicular cancer Brother    Esophageal cancer Brother    Hypotension Maternal Grandmother    Aortic dissection Maternal Grandmother    Heart disease Maternal Grandfather    Diabetes Paternal Grandmother        AODM   Diabetes Paternal Grandfather        AODM   Healthy Daughter    Healthy Son    Ovarian cancer Paternal Great-grandmother     Social History   Tobacco Use   Smoking status: Never    Passive exposure: Never   Smokeless tobacco: Never  Vaping Use   Vaping Use: Never used  Substance Use Topics   Alcohol use: Yes    Alcohol/week: 2.0 standard drinks    Types: 2 Standard drinks or equivalent per week    Comment: social   Drug use: Never    Medications: I have reviewed the patient's current medications. Allergies as of 10/30/2021       Reactions   Bee Venom Anaphylaxis   Flagyl [metronidazole] Anaphylaxis   Iodine Anaphylaxis   Shellfish Allergy Anaphylaxis   Tramadol Hcl Other (See Comments)   Showed signs of stroke   Gluten Meal    Lactose Intolerance (gi)         Medication List        Accurate as of Oct 30, 2021 11:59 PM. If  you have any questions, ask your nurse or doctor.          Adult Gummy Chew Chew 1 capsule by mouth daily.   Alka-Seltzer Heartburn 1940-1000 MG Tbef Generic drug: Sodium Bicarbonate-Citric Acid Take 1-4 tablets by mouth daily as needed (heartburn).   celecoxib 200 MG capsule Commonly known as: CeleBREX Take 1 capsule (200 mg total) by mouth 2 (two) times daily.   Citrucel oral powder Generic drug: methylcellulose Use as directed Daily   EPINEPHrine 0.3 mg/0.3 mL Soaj injection Commonly known as: EPI-PEN Inject 0.3 mg into the muscle as needed for anaphylaxis.   famotidine 40 MG tablet Commonly known as: Pepcid Take 1 tablet (40 mg total) by mouth 2 (two) times daily.   Gaviscon 80-14.2 MG Chew Generic drug: Alum Hydroxide-Mag Trisilicate Use as directed   GI Cocktail (alum & mag hydroxide, lidocaine,  dicyclomine) oral mixture 90 ml 2 % Lidocaine, 90 ml Dicyclomine 97m/5ml, 270 ml Maalox - Take 5-10 ml every 4-6 hours as needed.   hydrOXYzine 10 MG tablet Commonly known as: ATARAX TAKE 1/2 TO 1 TABLET BY MOUTH AT BEDTIME AS NEEDED FOR SLEEP   levothyroxine 75 MCG tablet Commonly known as: SYNTHROID Take 1 tablet (75 mcg total) by mouth daily before breakfast.   liothyronine 5 MCG tablet Commonly known as: CYTOMEL Take 1 tablet (5 mcg total) by mouth daily.   loratadine 10 MG tablet Commonly known as: CLARITIN Take 10 mg by mouth daily.   methocarbamol 500 MG tablet Commonly known as: Robaxin Take 1 tablet (500 mg total) by mouth every 6 (six) hours as needed for muscle spasms.   ondansetron 4 MG tablet Commonly known as: ZOFRAN Take 1 tablet (4 mg total) by mouth every 6 (six) hours.   oxycodone 5 MG capsule Commonly known as: OXY-IR Take 1 capsule (5 mg total) by mouth every 4 (four) hours as needed.   polyethylene glycol 17 g packet Commonly known as: MiraLax Take 17 g by mouth every other day.   sucralfate 1 g tablet Commonly known as: Carafate Take 1 tablet (1 g total) by mouth every 6 (six) hours as needed. Slowy dissolve 1 tablet in 1 Tablespoon of distilled water to make a slurry before ingesting   traZODone 50 MG tablet Commonly known as: DESYREL Take 0.5-1 tablets (25-50 mg total) by mouth at bedtime as needed for sleep.   venlafaxine XR 75 MG 24 hr capsule Commonly known as: EFFEXOR-XR TAKE 1 CAPSULE(75 MG) BY MOUTH DAILY WITH BREAKFAST   vitamin B-12 1000 MCG tablet Commonly known as: CYANOCOBALAMIN Take 1,000 mcg by mouth daily.   vitamin k 100 MCG tablet Take 100 mcg by mouth daily.         ROS:  Constitutional: negative for chills, fatigue, and fevers Eyes: negative for visual disturbance and pain Ears, nose, mouth, throat, and face: positive for sinus problems, negative for ear drainage and sore throat Respiratory: negative for cough,  wheezing, and shortness of breath Cardiovascular: negative for chest pain and palpitations Gastrointestinal: positive for abdominal pain, nausea, and reflux symptoms Genitourinary:positive for frequency, negative for dysuria and urinary retention Integument/breast: negative for dryness and rash Hematologic/lymphatic: negative for bleeding and lymphadenopathy Musculoskeletal:positive for back pain, neck pain, and joint pain Neurological: negative for dizziness, tremors, and numbness Endocrine: negative for temperature intolerance  Blood pressure 118/80, pulse 80, temperature 98.5 F (36.9 C), temperature source Oral, resp. rate 14, height 5' 10"  (1.778 m), weight 165 lb (74.8 kg), SpO2 97 %.  Physical Exam Vitals reviewed.  Constitutional:      Appearance: Normal appearance.  Eyes:     Extraocular Movements: Extraocular movements intact.     Pupils: Pupils are equal, round, and reactive to light.  Cardiovascular:     Rate and Rhythm: Normal rate and regular rhythm.  Pulmonary:     Effort: Pulmonary effort is normal.     Breath sounds: Normal breath sounds.  Abdominal:     Comments: Abdomen soft, nondistended, no percussion tenderness, nontender to palpation; no rigidity, guarding, rebound tenderness; negative Murphy's  Musculoskeletal:        General: Normal range of motion.     Cervical back: Normal range of motion.  Skin:    General: Skin is warm and dry.  Neurological:     General: No focal deficit present.     Mental Status: She is alert and oriented to person, place, and time.  Psychiatric:        Mood and Affect: Mood normal.        Behavior: Behavior normal.    Results: Abdominal ultrasound (10/25/2021): IMPRESSION: Cholelithiasis with large gallstones and with signs of potential gallbladder wall thickening, difficult to assess given patient body habitus. If there is high clinical suspicion for acute biliary process CT may be helpful for further evaluation. Currently  there is no reported tenderness over the gallbladder or signs of pericholecystic fluid.   No biliary duct dilation.  LFTs within normal limit and no leukocytosis noted on blood work from 10/25/2021  Assessment & Plan:  Colbie Sliker is a 56 y.o. female who presents for evaluation of cholelithiasis.  -I explained the pathophysiology of cholelithiasis and gallbladder disease, and I also explained why we recommend cholecystectomy -I counseled the patient about the indication, risks and benefits of laparoscopic cholecystectomy.  She understands there is a very small chance for bleeding, infection, injury to normal structures (including common bile duct), conversion to open surgery, persistent symptoms, evolution of postcholecystectomy diarrhea, need for secondary interventions, anesthesia reaction, cardiopulmonary issues and other risks not specifically detailed here. I described the expected recovery, the plan for follow-up and the restrictions during the recovery phase.  All questions were answered. -Patient tentatively scheduled for surgery on 5/25. 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  I explained that she is at risk for DVT if she were to fly on a cross-country flight in the immediate postoperative period.  I advised it would be best for her to wait at least 1 to 2 weeks after her surgery before she flew.  I also explained that if her trip is necessary, we could reschedule surgery versus her being aware of the risk of DVT and making sure to ambulate frequently while on the plane -Information provided on low-fat diet and cholecystectomy -Patient has significant allergy to tramadol  All questions were answered to the satisfaction of the patient and family.  Graciella Freer, DO [facility] [address] [place], [place] [address] [phone]  (office)

## 2021-11-02 NOTE — Patient Instructions (Signed)
Candace Allen  11/02/2021     @PREFPERIOPPHARMACY @   Your procedure is scheduled on  11/08/2021.   Report to [facility] at  0600  A.M.   Call this number if you have problems the morning of surgery:  [phone]   Remember:  Do not eat or drink after midnight.      Take these medicines the morning of surgery with A SIP OF WATER         pepcid, effexor, levothyroxne, claritin, zofran (if needed), OXY IR (if needed).     Do not wear jewelry, make-up or nail polish.  Do not wear lotions, powders, or perfumes, or deodorant.  Do not shave 48 hours prior to surgery.  Men may shave face and neck.  Do not bring valuables to the hospital.  [facility] is not responsible for any belongings or valuables.  Contacts, dentures or bridgework may not be worn into surgery.  Leave your suitcase in the car.  After surgery it may be brought to your room.  For patients admitted to the hospital, discharge time will be determined by your treatment team.  Patients discharged the day of surgery will not be allowed to drive home and must have someone with them for 24 hours.    Special instructions:   DO NOT smoke tobacco or vape for 24 hours before your procedure.  Please read over the following fact sheets that you were given. Anesthesia Post-op Instructions and Care and Recovery After Surgery      Minimally Invasive Cholecystectomy, Care After The following information offers guidance on how to care for yourself after your procedure. Your health care provider may also give you more specific instructions. If you have problems or questions, contact your health care provider. What can I expect after the procedure? After the procedure, it is common to have: Pain at your incision sites. You will be given medicines to control this pain. Mild nausea or vomiting. Bloating and possible shoulder pain from the gas that was used during the procedure. Follow these instructions at  home: Medicines Take over-the-counter and prescription medicines only as told by your health care provider. If you were prescribed an antibiotic medicine, take it as told by your health care provider. Do not stop using the antibiotic even if you start to feel better. Ask your health care provider if the medicine prescribed to you: Requires you to avoid driving or using machinery. Can cause constipation. You may need to take these actions to prevent or treat constipation: Drink enough fluid to keep your urine pale yellow. Take over-the-counter or prescription medicines. Eat foods that are high in fiber, such as beans, whole grains, and fresh fruits and vegetables. Limit foods that are high in fat and processed sugars, such as fried or sweet foods. Incision care  Follow instructions from your health care provider about how to take care of your incisions. Make sure you: Wash your hands with soap and water for at least 20 seconds before and after you change your bandage (dressing). If soap and water are not available, use hand sanitizer. Change your dressing as told by your health care provider. Leave stitches (sutures), skin glue, or adhesive strips in place. These skin closures may need to be in place for 2 weeks or longer. If adhesive strip edges start to loosen and curl up, you may trim the loose edges. Do not remove adhesive strips completely unless your health care provider tells  you to do that. Do not take baths, swim, or use a hot tub until your health care provider approves. Ask your health care provider if you may take showers. You may only be allowed to take sponge baths. Check your incision area every day for signs of infection. Check for: More redness, swelling, or pain. Fluid or blood. Warmth. Pus or a bad smell. Activity Rest as told by your health care provider. Do not do activities that require a lot of effort. Avoid sitting for a long time without moving. Get up to take short  walks every 1-2 hours. This is important to improve blood flow and breathing. Ask for help if you feel weak or unsteady. Do not lift anything that is heavier than 10 lb (4.5 kg), or the limit that you are told, until your health care provider says that it is safe. Do not play contact sports until your health care provider approves. Do not return to work or school until your health care provider approves. Return to your normal activities as told by your health care provider. Ask your health care provider what activities are safe for you. General instructions If you were given a sedative during the procedure, it can affect you for several hours. Do not drive or operate machinery until your health care provider says that it is safe. Keep all follow-up visits. This is important. Contact a health care provider if: You develop a rash. You have more redness, swelling, or pain around your incisions. You have fluid or blood coming from your incisions. Your incisions feel warm to the touch. You have pus or a bad smell coming from your incisions. You have a fever. One or more of your incisions breaks open. Get help right away if: You have trouble breathing. You have chest pain. You have more pain in your shoulders. You faint or feel dizzy when you stand. You have severe pain in your abdomen. You have nausea or vomiting that lasts for more than one day. You have leg pain that is new or unusual, or if it is localized to one specific spot. These symptoms may represent a serious problem that is an emergency. Do not wait to see if the symptoms will go away. Get medical help right away. Call your local emergency services (911 in the [place] Do not drive yourself to the hospital. Summary After your procedure, it is common to have pain at the incision sites. You may also have nausea or bloating. Follow your health care provider's instructions about medicine, activity restrictions, and caring for your incision  areas. Do not do activities that require a lot of effort. Contact a health care provider if you have a fever or other signs of infection, such as more redness, swelling, or pain around the incisions. Get help right away if you have chest pain, increasing pain in the shoulders, or trouble breathing. This information is not intended to replace advice given to you by your health care provider. Make sure you discuss any questions you have with your health care provider. Document Revised: 12/05/2020 Document Reviewed: 12/05/2020 Elsevier Patient Education  [place] Anesthesia, Adult, Care After This sheet gives you information about how to care for yourself after your procedure. Your health care provider may also give you more specific instructions. If you have problems or questions, contact your health care provider. What can I expect after the procedure? After the procedure, the following side effects are common: Pain or discomfort at the IV site.  Nausea. Vomiting. Sore throat. Trouble concentrating. Feeling cold or chills. Feeling weak or tired. Sleepiness and fatigue. Soreness and body aches. These side effects can affect parts of the body that were not involved in surgery. Follow these instructions at home: For the time period you were told by your health care provider:  Rest. Do not participate in activities where you could fall or become injured. Do not drive or use machinery. Do not drink alcohol. Do not take sleeping pills or medicines that cause drowsiness. Do not make important decisions or sign legal documents. Do not take care of children on your own. Eating and drinking Follow any instructions from your health care provider about eating or drinking restrictions. When you feel hungry, start by eating small amounts of foods that are soft and easy to digest (bland), such as toast. Gradually return to your regular diet. Drink enough fluid to keep your urine  pale yellow. If you vomit, rehydrate by drinking water, juice, or clear broth. General instructions If you have sleep apnea, surgery and certain medicines can increase your risk for breathing problems. Follow instructions from your health care provider about wearing your sleep device: Anytime you are sleeping, including during daytime naps. While taking prescription pain medicines, sleeping medicines, or medicines that make you drowsy. Have a responsible adult stay with you for the time you are told. It is important to have someone help care for you until you are awake and alert. Return to your normal activities as told by your health care provider. Ask your health care provider what activities are safe for you. Take over-the-counter and prescription medicines only as told by your health care provider. If you smoke, do not smoke without supervision. Keep all follow-up visits as told by your health care provider. This is important. Contact a health care provider if: You have nausea or vomiting that does not get better with medicine. You cannot eat or drink without vomiting. You have pain that does not get better with medicine. You are unable to pass urine. You develop a skin rash. You have a fever. You have redness around your IV site that gets worse. Get help right away if: You have difficulty breathing. You have chest pain. You have blood in your urine or stool, or you vomit blood. Summary After the procedure, it is common to have a sore throat or nausea. It is also common to feel tired. Have a responsible adult stay with you for the time you are told. It is important to have someone help care for you until you are awake and alert. When you feel hungry, start by eating small amounts of foods that are soft and easy to digest (bland), such as toast. Gradually return to your regular diet. Drink enough fluid to keep your urine pale yellow. Return to your normal activities as told by your  health care provider. Ask your health care provider what activities are safe for you. This information is not intended to replace advice given to you by your health care provider. Make sure you discuss any questions you have with your health care provider. Document Revised: 02/17/2020 Document Reviewed: 09/16/2019 Elsevier Patient Education  [place]. How to Use Chlorhexidine for Bathing Chlorhexidine gluconate (CHG) is a germ-killing (antiseptic) solution that is used to clean the skin. It can get rid of the bacteria that normally live on the skin and can keep them away for about 24 hours. To clean your skin with CHG, you may be given: A  CHG solution to use in the shower or as part of a sponge bath. A prepackaged cloth that contains CHG. Cleaning your skin with CHG may help lower the risk for infection: While you are staying in the intensive care unit of the hospital. If you have a vascular access, such as a central line, to provide short-term or long-term access to your veins. If you have a catheter to drain urine from your bladder. If you are on a ventilator. A ventilator is a machine that helps you breathe by moving air in and out of your lungs. After surgery. What are the risks? Risks of using CHG include: A skin reaction. Hearing loss, if CHG gets in your ears and you have a perforated eardrum. Eye injury, if CHG gets in your eyes and is not rinsed out. The CHG product catching fire. Make sure that you avoid smoking and flames after applying CHG to your skin. Do not use CHG: If you have a chlorhexidine allergy or have previously reacted to chlorhexidine. On babies younger than 58 months of age. How to use CHG solution Use CHG only as told by your health care provider, and follow the instructions on the label. Use the full amount of CHG as directed. Usually, this is one bottle. During a shower Follow these steps when using CHG solution during a shower (unless your health  care provider gives you different instructions): Start the shower. Use your normal soap and shampoo to wash your face and hair. Turn off the shower or move out of the shower stream. Pour the CHG onto a clean washcloth. Do not use any type of brush or rough-edged sponge. Starting at your neck, lather your body down to your toes. Make sure you follow these instructions: If you will be having surgery, pay special attention to the part of your body where you will be having surgery. Scrub this area for at least 1 minute. Do not use CHG on your head or face. If the solution gets into your ears or eyes, rinse them well with water. Avoid your genital area. Avoid any areas of skin that have broken skin, cuts, or scrapes. Scrub your back and under your arms. Make sure to wash skin folds. Let the lather sit on your skin for 1-2 minutes or as long as told by your health care provider. Thoroughly rinse your entire body in the shower. Make sure that all body creases and crevices are rinsed well. Dry off with a clean towel. Do not put any substances on your body afterward--such as powder, lotion, or perfume--unless you are told to do so by your health care provider. Only use lotions that are recommended by the manufacturer. Put on clean clothes or pajamas. If it is the night before your surgery, sleep in clean sheets.  During a sponge bath Follow these steps when using CHG solution during a sponge bath (unless your health care provider gives you different instructions): Use your normal soap and shampoo to wash your face and hair. Pour the CHG onto a clean washcloth. Starting at your neck, lather your body down to your toes. Make sure you follow these instructions: If you will be having surgery, pay special attention to the part of your body where you will be having surgery. Scrub this area for at least 1 minute. Do not use CHG on your head or face. If the solution gets into your ears or eyes, rinse them well  with water. Avoid your genital area. Avoid any areas  of skin that have broken skin, cuts, or scrapes. Scrub your back and under your arms. Make sure to wash skin folds. Let the lather sit on your skin for 1-2 minutes or as long as told by your health care provider. Using a different clean, wet washcloth, thoroughly rinse your entire body. Make sure that all body creases and crevices are rinsed well. Dry off with a clean towel. Do not put any substances on your body afterward--such as powder, lotion, or perfume--unless you are told to do so by your health care provider. Only use lotions that are recommended by the manufacturer. Put on clean clothes or pajamas. If it is the night before your surgery, sleep in clean sheets. How to use CHG prepackaged cloths Only use CHG cloths as told by your health care provider, and follow the instructions on the label. Use the CHG cloth on clean, dry skin. Do not use the CHG cloth on your head or face unless your health care provider tells you to. When washing with the CHG cloth: Avoid your genital area. Avoid any areas of skin that have broken skin, cuts, or scrapes. Before surgery Follow these steps when using a CHG cloth to clean before surgery (unless your health care provider gives you different instructions): Using the CHG cloth, vigorously scrub the part of your body where you will be having surgery. Scrub using a back-and-forth motion for 3 minutes. The area on your body should be completely wet with CHG when you are done scrubbing. Do not rinse. Discard the cloth and let the area air-dry. Do not put any substances on the area afterward, such as powder, lotion, or perfume. Put on clean clothes or pajamas. If it is the night before your surgery, sleep in clean sheets.  For general bathing Follow these steps when using CHG cloths for general bathing (unless your health care provider gives you different instructions). Use a separate CHG cloth for each  area of your body. Make sure you wash between any folds of skin and between your fingers and toes. Wash your body in the following order, switching to a new cloth after each step: The front of your neck, shoulders, and chest. Both of your arms, under your arms, and your hands. Your stomach and groin area, avoiding the genitals. Your right leg and foot. Your left leg and foot. The back of your neck, your back, and your buttocks. Do not rinse. Discard the cloth and let the area air-dry. Do not put any substances on your body afterward--such as powder, lotion, or perfume--unless you are told to do so by your health care provider. Only use lotions that are recommended by the manufacturer. Put on clean clothes or pajamas. Contact a health care provider if: Your skin gets irritated after scrubbing. You have questions about using your solution or cloth. You swallow any chlorhexidine. Call your local poison control center (1-224-784-3972 in the [place] Get help right away if: Your eyes itch badly, or they become very red or swollen. Your skin itches badly and is red or swollen. Your hearing changes. You have trouble seeing. You have swelling or tingling in your mouth or throat. You have trouble breathing. These symptoms may represent a serious problem that is an emergency. Do not wait to see if the symptoms will go away. Get medical help right away. Call your local emergency services (911 in the [place] Do not drive yourself to the hospital. Summary Chlorhexidine gluconate (CHG) is a germ-killing (antiseptic) solution that is  used to clean the skin. Cleaning your skin with CHG may help to lower your risk for infection. You may be given CHG to use for bathing. It may be in a bottle or in a prepackaged cloth to use on your skin. Carefully follow your health care provider's instructions and the instructions on the product label. Do not use CHG if you have a chlorhexidine allergy. Contact your health care  provider if your skin gets irritated after scrubbing. This information is not intended to replace advice given to you by your health care provider. Make sure you discuss any questions you have with your health care provider. Document Revised: 08/14/2020 Document Reviewed: 08/14/2020 Elsevier Patient Education  [place].

## 2021-11-06 ENCOUNTER — Encounter (HOSPITAL_COMMUNITY): Payer: Self-pay

## 2021-11-06 ENCOUNTER — Encounter (HOSPITAL_COMMUNITY)
Admission: RE | Admit: 2021-11-06 | Discharge: 2021-11-06 | Disposition: A | Discharge status: Home / Self Care | Payer: 59 | Source: Ambulatory Visit | Attending: Surgery | Admitting: Surgery

## 2021-11-06 DIAGNOSIS — Z01818 Encounter for other preprocedural examination: Secondary | ICD-10-CM

## 2021-11-08 ENCOUNTER — Other Ambulatory Visit: Payer: Self-pay

## 2021-11-08 ENCOUNTER — Encounter (HOSPITAL_COMMUNITY)
Admission: RE | Disposition: A | Discharge status: Home / Self Care | Payer: Self-pay | Source: Ambulatory Visit | Attending: Surgery

## 2021-11-08 ENCOUNTER — Ambulatory Visit (HOSPITAL_COMMUNITY): Payer: 59 | Admitting: Anesthesiology

## 2021-11-08 ENCOUNTER — Ambulatory Visit (HOSPITAL_COMMUNITY)
Admission: RE | Admit: 2021-11-08 | Discharge: 2021-11-08 | Disposition: A | Discharge status: Home / Self Care | Payer: 59 | Source: Ambulatory Visit | Attending: Surgery | Admitting: Surgery

## 2021-11-08 ENCOUNTER — Telehealth: Payer: Self-pay | Admitting: *Deleted

## 2021-11-08 ENCOUNTER — Encounter (HOSPITAL_COMMUNITY): Payer: Self-pay | Admitting: Surgery

## 2021-11-08 ENCOUNTER — Ambulatory Visit (HOSPITAL_BASED_OUTPATIENT_CLINIC_OR_DEPARTMENT_OTHER): Payer: 59 | Admitting: Anesthesiology

## 2021-11-08 DIAGNOSIS — R5383 Other fatigue: Secondary | ICD-10-CM

## 2021-11-08 DIAGNOSIS — N183 Chronic kidney disease, stage 3 unspecified: Secondary | ICD-10-CM | POA: Diagnosis not present

## 2021-11-08 DIAGNOSIS — Z780 Asymptomatic menopausal state: Secondary | ICD-10-CM

## 2021-11-08 DIAGNOSIS — K219 Gastro-esophageal reflux disease without esophagitis: Secondary | ICD-10-CM | POA: Diagnosis not present

## 2021-11-08 DIAGNOSIS — K589 Irritable bowel syndrome without diarrhea: Secondary | ICD-10-CM

## 2021-11-08 DIAGNOSIS — Z0000 Encounter for general adult medical examination without abnormal findings: Secondary | ICD-10-CM

## 2021-11-08 DIAGNOSIS — R7303 Prediabetes: Secondary | ICD-10-CM | POA: Insufficient documentation

## 2021-11-08 DIAGNOSIS — K8020 Calculus of gallbladder without cholecystitis without obstruction: Secondary | ICD-10-CM

## 2021-11-08 DIAGNOSIS — G459 Transient cerebral ischemic attack, unspecified: Secondary | ICD-10-CM

## 2021-11-08 DIAGNOSIS — Z79890 Hormone replacement therapy (postmenopausal): Secondary | ICD-10-CM

## 2021-11-08 DIAGNOSIS — Z1231 Encounter for screening mammogram for malignant neoplasm of breast: Secondary | ICD-10-CM

## 2021-11-08 DIAGNOSIS — R634 Abnormal weight loss: Secondary | ICD-10-CM

## 2021-11-08 DIAGNOSIS — K449 Diaphragmatic hernia without obstruction or gangrene: Secondary | ICD-10-CM | POA: Insufficient documentation

## 2021-11-08 DIAGNOSIS — E039 Hypothyroidism, unspecified: Secondary | ICD-10-CM | POA: Diagnosis not present

## 2021-11-08 DIAGNOSIS — K8010 Calculus of gallbladder with chronic cholecystitis without obstruction: Secondary | ICD-10-CM | POA: Insufficient documentation

## 2021-11-08 DIAGNOSIS — K5790 Diverticulosis of intestine, part unspecified, without perforation or abscess without bleeding: Secondary | ICD-10-CM

## 2021-11-08 DIAGNOSIS — Z8542 Personal history of malignant neoplasm of other parts of uterus: Secondary | ICD-10-CM

## 2021-11-08 HISTORY — PX: CHOLECYSTECTOMY: SHX55

## 2021-11-08 SURGERY — LAPAROSCOPIC CHOLECYSTECTOMY
Anesthesia: General

## 2021-11-08 MED ORDER — PROPOFOL 10 MG/ML IV BOLUS
INTRAVENOUS | Status: DC | PRN
Start: 2021-11-08 — End: 2021-11-08
  Administered 2021-11-08: 150 mg via INTRAVENOUS

## 2021-11-08 MED ORDER — ONDANSETRON HCL 4 MG/2ML IJ SOLN: 4.0000 mg | Freq: Once | INTRAMUSCULAR | Status: DC | PRN

## 2021-11-08 MED ORDER — FENTANYL CITRATE (PF) 100 MCG/2ML IJ SOLN
INTRAMUSCULAR | Status: DC | PRN
  Administered 2021-11-08: 50 ug via INTRAVENOUS
  Administered 2021-11-08: 100 ug via INTRAVENOUS
  Administered 2021-11-08: 50 ug via INTRAVENOUS

## 2021-11-08 MED ORDER — ROCURONIUM BROMIDE 10 MG/ML (PF) SYRINGE
PREFILLED_SYRINGE | INTRAVENOUS | Status: AC
  Filled 2021-11-08: qty 10

## 2021-11-08 MED ORDER — HEMOSTATIC AGENTS (NO CHARGE) OPTIME
TOPICAL | Status: DC | PRN
  Administered 2021-11-08: 1 via TOPICAL

## 2021-11-08 MED ORDER — PROPOFOL 10 MG/ML IV BOLUS
INTRAVENOUS | Status: AC
  Filled 2021-11-08: qty 20

## 2021-11-08 MED ORDER — DEXAMETHASONE SODIUM PHOSPHATE 10 MG/ML IJ SOLN
INTRAMUSCULAR | Status: DC | PRN
  Administered 2021-11-08: 10 mg via INTRAVENOUS

## 2021-11-08 MED ORDER — ORAL CARE MOUTH RINSE: 15.0000 mL | Freq: Once | OROMUCOSAL | Status: AC

## 2021-11-08 MED ORDER — ROCURONIUM BROMIDE 10 MG/ML (PF) SYRINGE
PREFILLED_SYRINGE | INTRAVENOUS | Status: DC | PRN
Start: 2021-11-08 — End: 2021-11-08
  Administered 2021-11-08: 50 mg via INTRAVENOUS

## 2021-11-08 MED ORDER — MIDAZOLAM HCL 2 MG/2ML IJ SOLN
INTRAMUSCULAR | Status: AC
  Administered 2021-11-08: 2 mg via INTRAVENOUS
  Filled 2021-11-08: qty 2

## 2021-11-08 MED ORDER — SCOPOLAMINE 1 MG/3DAYS TD PT72
MEDICATED_PATCH | TRANSDERMAL | Status: AC
  Administered 2021-11-08: 1.5 mg via TRANSDERMAL
  Filled 2021-11-08: qty 1

## 2021-11-08 MED ORDER — ACETAMINOPHEN 500 MG PO TABS: 1000.0000 mg | ORAL_TABLET | Freq: Four times a day (QID) | ORAL | 0 refills | Status: DC

## 2021-11-08 MED ORDER — MIDAZOLAM HCL 2 MG/2ML IJ SOLN: 2.0000 mg | Freq: Once | INTRAMUSCULAR | Status: AC

## 2021-11-08 MED ORDER — DOCUSATE SODIUM 100 MG PO CAPS: 100.0000 mg | ORAL_CAPSULE | Freq: Two times a day (BID) | ORAL | 2 refills | Status: DC

## 2021-11-08 MED ORDER — LACTATED RINGERS IV SOLN: INTRAVENOUS | Status: DC

## 2021-11-08 MED ORDER — LIDOCAINE HCL (PF) 2 % IJ SOLN
INTRAMUSCULAR | Status: AC
  Filled 2021-11-08: qty 5

## 2021-11-08 MED ORDER — LIDOCAINE HCL (CARDIAC) PF 100 MG/5ML IV SOSY
PREFILLED_SYRINGE | INTRAVENOUS | Status: DC | PRN
Start: 2021-11-08 — End: 2021-11-08
  Administered 2021-11-08: 60 mg via INTRATRACHEAL

## 2021-11-08 MED ORDER — HYDROCODONE-ACETAMINOPHEN 5-325 MG PO TABS: 1.0000 | ORAL_TABLET | Freq: Four times a day (QID) | ORAL | 0 refills | Status: DC | PRN

## 2021-11-08 MED ORDER — CHLORHEXIDINE GLUCONATE 0.12 % MT SOLN
15.0000 mL | Freq: Once | OROMUCOSAL | Status: AC
  Administered 2021-11-08: 15 mL via OROMUCOSAL

## 2021-11-08 MED ORDER — BUPIVACAINE HCL (PF) 0.5 % IJ SOLN
INTRAMUSCULAR | Status: DC | PRN
  Administered 2021-11-08: 30 mL

## 2021-11-08 MED ORDER — MIDAZOLAM HCL 2 MG/2ML IJ SOLN
INTRAMUSCULAR | Status: DC | PRN
  Administered 2021-11-08: 2 mg via INTRAVENOUS

## 2021-11-08 MED ORDER — FENTANYL CITRATE (PF) 250 MCG/5ML IJ SOLN
INTRAMUSCULAR | Status: AC
  Filled 2021-11-08: qty 5

## 2021-11-08 MED ORDER — CHLORHEXIDINE GLUCONATE CLOTH 2 % EX PADS: 6.0000 | MEDICATED_PAD | Freq: Once | CUTANEOUS | Status: DC

## 2021-11-08 MED ORDER — BUPIVACAINE HCL (PF) 0.5 % IJ SOLN
INTRAMUSCULAR | Status: AC
  Filled 2021-11-08: qty 30

## 2021-11-08 MED ORDER — DEXAMETHASONE SODIUM PHOSPHATE 10 MG/ML IJ SOLN
INTRAMUSCULAR | Status: AC
  Filled 2021-11-08: qty 1

## 2021-11-08 MED ORDER — ONDANSETRON HCL 4 MG/2ML IJ SOLN
INTRAMUSCULAR | Status: DC | PRN
  Administered 2021-11-08: 4 mg via INTRAVENOUS

## 2021-11-08 MED ORDER — OXYCODONE-ACETAMINOPHEN 5-325 MG PO TABS
1.0000 | ORAL_TABLET | Freq: Once | ORAL | Status: AC
  Administered 2021-11-08: 1 via ORAL

## 2021-11-08 MED ORDER — OXYCODONE HCL 5 MG PO TABS: 5.0000 mg | ORAL_TABLET | Freq: Four times a day (QID) | ORAL | 0 refills | Status: DC | PRN

## 2021-11-08 MED ORDER — SCOPOLAMINE 1 MG/3DAYS TD PT72: 1.0000 | MEDICATED_PATCH | Freq: Once | TRANSDERMAL | Status: DC

## 2021-11-08 MED ORDER — OXYCODONE-ACETAMINOPHEN 5-325 MG PO TABS
ORAL_TABLET | ORAL | Status: AC
  Filled 2021-11-08: qty 1

## 2021-11-08 MED ORDER — SODIUM CHLORIDE 0.9 % IV SOLN
2.0000 g | INTRAVENOUS | Status: AC
  Administered 2021-11-08: 2 g via INTRAVENOUS
  Filled 2021-11-08: qty 2

## 2021-11-08 MED ORDER — HYDROMORPHONE HCL 1 MG/ML IJ SOLN
0.2500 mg | INTRAMUSCULAR | Status: DC | PRN
  Administered 2021-11-08: 0.5 mg via INTRAVENOUS
  Filled 2021-11-08 (×2): qty 0.5

## 2021-11-08 MED ORDER — ONDANSETRON HCL 4 MG/2ML IJ SOLN
INTRAMUSCULAR | Status: AC
  Filled 2021-11-08: qty 2

## 2021-11-08 MED ORDER — MIDAZOLAM HCL 2 MG/2ML IJ SOLN
INTRAMUSCULAR | Status: AC
  Filled 2021-11-08: qty 2

## 2021-11-08 SURGICAL SUPPLY — 43 items
APPLIER CLIP ROT 10 11.4 M/L (STAPLE) ×2
BAG RETRIEVAL 10 (BASKET) ×1
BLADE SURG 15 STRL LF DISP TIS (BLADE) ×1 IMPLANT
BLADE SURG 15 STRL SS (BLADE) ×1
CHLORAPREP W/TINT 26 (MISCELLANEOUS) ×2 IMPLANT
CLIP APPLIE ROT 10 11.4 M/L (STAPLE) ×1 IMPLANT
CLOTH BEACON ORANGE TIMEOUT ST (SAFETY) ×2 IMPLANT
COVER LIGHT HANDLE STERIS (MISCELLANEOUS) ×4 IMPLANT
DECANTER SPIKE VIAL GLASS SM (MISCELLANEOUS) ×2 IMPLANT
DERMABOND ADVANCED (GAUZE/BANDAGES/DRESSINGS) ×1
DERMABOND ADVANCED .7 DNX12 (GAUZE/BANDAGES/DRESSINGS) ×1 IMPLANT
ELECT REM PT RETURN 9FT ADLT (ELECTROSURGICAL) ×2
ELECTRODE REM PT RTRN 9FT ADLT (ELECTROSURGICAL) ×1 IMPLANT
GAUZE 4X4 16PLY ~~LOC~~+RFID DBL (SPONGE) IMPLANT
GLOVE BIO SURGEON STRL SZ 6.5 (GLOVE) ×3 IMPLANT
GLOVE BIO SURGEON STRL SZ7.5 (GLOVE) ×1 IMPLANT
GLOVE BIOGEL PI IND STRL 6.5 (GLOVE) ×1 IMPLANT
GLOVE BIOGEL PI IND STRL 7.0 (GLOVE) ×2 IMPLANT
GLOVE BIOGEL PI INDICATOR 6.5 (GLOVE) ×2
GLOVE BIOGEL PI INDICATOR 7.0 (GLOVE) ×3
GLOVE SURG SS PI 6.5 STRL IVOR (GLOVE) ×4 IMPLANT
GOWN STRL REUS W/TWL LRG LVL3 (GOWN DISPOSABLE) ×6 IMPLANT
HEMOSTAT SNOW SURGICEL 2X4 (HEMOSTASIS) ×2 IMPLANT
INST SET LAPROSCOPIC AP (KITS) ×2 IMPLANT
KIT TURNOVER KIT A (KITS) ×2 IMPLANT
MANIFOLD NEPTUNE II (INSTRUMENTS) ×2 IMPLANT
NDL INSUFFLATION 14GA 120MM (NEEDLE) ×1 IMPLANT
NEEDLE INSUFFLATION 14GA 120MM (NEEDLE) ×2 IMPLANT
NS IRRIG 1000ML POUR BTL (IV SOLUTION) ×2 IMPLANT
PACK LAP CHOLE LZT030E (CUSTOM PROCEDURE TRAY) ×2 IMPLANT
PAD ARMBOARD 7.5X6 YLW CONV (MISCELLANEOUS) ×2 IMPLANT
SET BASIN LINEN APH (SET/KITS/TRAYS/PACK) ×2 IMPLANT
SET TUBE SMOKE EVAC HIGH FLOW (TUBING) ×2 IMPLANT
SLEEVE Z-THREAD 5X100MM (TROCAR) ×2 IMPLANT
SUT MNCRL AB 4-0 PS2 18 (SUTURE) ×4 IMPLANT
SUT VICRYL 0 UR6 27IN ABS (SUTURE) ×2 IMPLANT
SYS BAG RETRIEVAL 10MM (BASKET) ×1
SYSTEM BAG RETRIEVAL 10MM (BASKET) ×1 IMPLANT
TROCAR Z-THRD FIOS HNDL 11X100 (TROCAR) ×2 IMPLANT
TROCAR Z-THREAD FIOS 5X100MM (TROCAR) ×2 IMPLANT
TROCAR Z-THREAD SLEEVE 11X100 (TROCAR) ×2 IMPLANT
TUBE CONNECTING 12X1/4 (SUCTIONS) ×2 IMPLANT
WARMER LAPAROSCOPE (MISCELLANEOUS) ×2 IMPLANT

## 2021-11-08 NOTE — Anesthesia Procedure Notes (Signed)
Procedure Name: Intubation Date/Time: 11/08/2021 7:36 AM Performed by: Karna Dupes, CRNA Pre-anesthesia Checklist: Patient identified, Emergency Drugs available, Suction available and Patient being monitored Patient Re-evaluated:Patient Re-evaluated prior to induction Oxygen Delivery Method: Circle system utilized Preoxygenation: Pre-oxygenation with 100% oxygen Induction Type: IV induction Ventilation: Mask ventilation without difficulty Laryngoscope Size: Mac and 3 Grade View: Grade I Tube type: Oral Tube size: 7.0 mm Number of attempts: 1 Airway Equipment and Method: Stylet Placement Confirmation: ETT inserted through vocal cords under direct vision, positive ETCO2 and breath sounds checked- equal and bilateral Secured at: 21 cm Tube secured with: Tape Dental Injury: Teeth and Oropharynx as per pre-operative assessment

## 2021-11-08 NOTE — Anesthesia Postprocedure Evaluation (Signed)
Anesthesia Post Note  Patient: Candace Allen  Procedure(s) Performed: LAPAROSCOPIC CHOLECYSTECTOMY  Patient location during evaluation: Phase II Anesthesia Type: General Level of consciousness: awake and alert and oriented Pain management: pain level controlled Vital Signs Assessment: post-procedure vital signs reviewed and stable Respiratory status: spontaneous breathing, nonlabored ventilation and respiratory function stable Cardiovascular status: blood pressure returned to baseline and stable Postop Assessment: no apparent nausea or vomiting Anesthetic complications: no   No notable events documented.   Last Vitals:  Vitals:   11/08/21 0930 11/08/21 0948  BP: 137/87 (!) 152/85  Pulse: 84 82  Resp: (!) 6 16  Temp:    SpO2: 98% 93%    Last Pain:  Vitals:   11/08/21 0948  TempSrc:   PainSc: 6                  Sarahi Borland C Zarrah [place]vel[place]

## 2021-11-08 NOTE — Telephone Encounter (Signed)
Patient returned call and made aware.

## 2021-11-08 NOTE — Op Note (Signed)
Operative Note   Preoperative Diagnosis: Symptomatic cholelithiasis   Postoperative Diagnosis: Same   Procedure(s) Performed: Laparoscopic cholecystectomy   Surgeon: Graciella Freer, DO    Assistants: Aviva Signs, MD   Anesthesia: General endotracheal   Anesthesiologist: Denese Killings, MD    Specimens: Gallbladder    Estimated Blood Loss: Minimal    Blood Replacement: None    Complications: None    Operative Findings: Mildly inflamed gallbladder containing gallstones  Indications: Patient is a 56 year old female who presents for laparoscopic cholecystectomy.  She has had RUQ abdominal pain that has been bothering her for the last couple of weeks.  She was evaluated in the ED, and abdominal US demonstrated cholelithiasis.  She is agreeable to surgery at this time.  All risks, benefits, and alternatives to laparoscopic cholecystectomy were discussed with the patient and her family, all of their questions were answered to their expressed satisfaction. The patient expresses she wishes to proceed, and informed consent was obtained.   Procedure: The patient was taken to the operating room and placed supine. General endotracheal anesthesia was induced. Intravenous antibiotics were administered per protocol. An orogastric tube positioned to decompress the stomach. The abdomen was prepared and draped in the usual sterile fashion.    A infraumbilical incision was made and a Veress technique was utilized to achieve pneumoperitoneum to 15 mmHg with carbon dioxide. A 11 mm optiview port was placed through the supraumbilical region, and a 10 mm 0-degree operative laparoscope was introduced. The area underlying the trocar and Veress needle were inspected and without evidence of injury.  Remaining trocars were placed under direct vision. Two 5 mm ports were placed in the right abdomen, between the anterior axillary and midclavicular line.  A final 11 mm port was placed through the  mid-epigastrium, near the falciform ligament.    The gallbladder fundus was elevated cephalad and the infundibulum was retracted to the patient's right. The gallbladder/cystic duct junction was skeletonized. The cystic artery noted in the triangle of Calot and was also skeletonized.  We then continued liberal medial and lateral dissection until the critical view of safety was achieved.    The cystic duct and cystic artery were doubly clipped and divided. The gallbladder was then dissected from the liver bed with electrocautery. The specimen was placed in an Endopouch and was retrieved through the epigastric site.   Final inspection revealed acceptable hemostasis. Surgical SNOW was placed in the gallbladder bed.  Trocars were removed and pneumoperitoneum was released.  0 Vicryl fascial sutures were used to close the epigastric and umbilical port sites. Skin incisions were closed with 4-0 Monocryl subcuticular sutures and Dermabond. Dr. Arnoldo Morale assisted with critical portions of the case, including dissection, retraction of the gallbladder, fascia and skin closure. The patient was awakened from anesthesia and extubated without complication.    Graciella Freer, DO  [facility] [address] [place], [place] [address] [phone] (office)

## 2021-11-08 NOTE — Interval H&P Note (Signed)
History and Physical Interval Note:  11/08/2021 7:16 AM  Candace Allen  has presented today for surgery, with the diagnosis of CHOLELITHIASIS.  The various methods of treatment have been discussed with the patient and family. After consideration of risks, benefits and other options for treatment, the patient has consented to  Procedure(s): LAPAROSCOPIC CHOLECYSTECTOMY (N/A) as a surgical intervention.  The patient's history has been reviewed, patient examined, no change in status, stable for surgery.  I have reviewed the patient's chart and labs.  Questions were answered to the patient's satisfaction.     [place]

## 2021-11-08 NOTE — Progress Notes (Signed)
Update Note:  Spoke with the patient's husband in the consultation room.  I explained that she did well during surgery, and her gallbladder was able to be removed without issue.  She has dissolvable stitches under the skin with overlying skin glue that will flake off in 10-14 days.  She should not lift more than 10 pounds for the next 2 weeks.  She can eat whatever diet she tolerates, but she may have loose bowel movements with eating fatty foods.  I will do a phone follow up with her in 2 weeks.  She will be discharged with Roxicodone.  She should take a stool softener while taking the narcotic pain medications.  She should also take Tylenol scheduled for 5-7 days after surgery to decrease inflammation and help with pain control.  All questions were answered to his expressed satisfaction.  Graciella Freer, DO [facility] [address] [place], [place] [address] [phone] (office)

## 2021-11-08 NOTE — Transfer of Care (Signed)
Immediate Anesthesia Transfer of Care Note  Patient: Candace Allen  Procedure(s) Performed: LAPAROSCOPIC CHOLECYSTECTOMY  Patient Location: PACU  Anesthesia Type:General  Level of Consciousness: drowsy  Airway & Oxygen Therapy: Patient Spontanous Breathing and Patient connected to face mask oxygen  Post-op Assessment: Report given to RN and Post -op Vital signs reviewed and stable  Post vital signs: Reviewed and stable  Last Vitals:  Vitals Value Taken Time  BP 135/83 11/08/21 0842  Temp    Pulse 79 11/08/21 0843  Resp 12 11/08/21 0843  SpO2 91 % 11/08/21 0843  Vitals shown include unvalidated device data.  Last Pain:  Vitals:   11/08/21 0656  TempSrc: Oral  PainSc: 0-No pain      Patients Stated Pain Goal: 6 (61/48/30 7354)  Complications: No notable events documented.

## 2021-11-08 NOTE — Discharge Instructions (Signed)
Ambulatory Surgery Discharge Instructions  General Anesthesia or Sedation Do not drive or operate heavy machinery for 24 hours.  Do not consume alcohol, tranquilizers, sleeping medications, or any non-prescribed medications for 24 hours. Do not make important decisions or sign any important papers in the next 24 hours. You should have someone with you tonight at home.  Activity  You are advised to go directly home from the hospital.  Restrict your activities and rest for a day.  Resume light activity tomorrow. No heavy lifting over 10 lbs or strenuous exercise.  Fluids and Diet Begin with clear liquids, bouillon, dry toast, soda crackers.  If not nauseated, you may go to a regular diet when you desire.  Greasy and spicy foods are not advised.  Medications  If you have not had a bowel movement in 24 hours, take 2 tablespoons over the counter Milk of mag.             You May resume your blood thinners tomorrow (Aspirin, coumadin, or other).  You are being discharged with prescriptions for Opioid/Narcotic Medications: There are some specific considerations for these medications that you should know. Opioid Meds have risks & benefits. Addiction to these meds is always a concern with prolonged use Take medication only as directed Do not drive while taking narcotic pain medication Do not crush tablets or capsules Do not use a different container than medication was dispensed in Lock the container of medication in a cool, dry place out of reach of children and pets. Opioid medication can cause addiction Do not share with anyone else (this is a felony) Do not store medications for future use. Dispose of them properly.     Disposal:  Find a [employer] household drug take back site near you.  If you can't get to a drug take back site, use the recipe below as a last resort to dispose of expired, unused or unwanted drugs. Disposal  (Do not dispose chemotherapy drugs this way, talk to your  prescribing doctor instead.) Step 1: Mix drugs (do not crush) with dirt, kitty litter, or used coffee grounds and add a small amount of water to dissolve any solid medications. Step 2: Seal drugs in plastic bag. Step 3: Place plastic bag in trash. Step 4: Take prescription container and scratch out personal information, then recycle or throw away.  Operative Site  You have a liquid bandage over your incisions, this will begin to flake off in about a week. Ok to Games developer. Keep wound clean and dry. No baths or swimming. No lifting more than 10 pounds.  Contact Information: If you have questions or concerns, please call our office, [phone], Monday- Thursday 8AM-5PM and Friday 8AM-12Noon.  If it is after hours or on the weekend, please call [facility], [phone], and ask to speak to the surgeon on call for Dr. Okey Dupre at [facility].   SPECIFIC COMPLICATIONS TO WATCH FOR: Inability to urinate Fever over 101? F by mouth Nausea and vomiting lasting longer than 24 hours. Pain not relieved by medication ordered Swelling around the operative site Increased redness, warmth, hardness, around operative area Numbness, tingling, or cold fingers or toes Blood -soaked dressing, (small amounts of oozing may be normal) Increasing and progressive drainage from surgical area or exam site

## 2021-11-08 NOTE — Telephone Encounter (Signed)
Received call from [place] [phone]~ telephone.   Reports that prescription for Oxycodone 34m cannot be filled as they do not have it in stock. Oxycodone/ APAP 5/3299mis available. Alternatively, Oxycodone 18m70man be sent to another retail pharmacy.   Dr. PapOkey Duprede aware and advised that new prescription for Oxycodone/APAP 3/218m66mll be sent to [facility]. Advised that patient cannot take scheduled APAP with this medication.   Call placed to patient. LMTRLynndyl

## 2021-11-08 NOTE — Anesthesia Preprocedure Evaluation (Addendum)
Anesthesia Evaluation  Patient identified by MRN, date of birth, ID band Patient awake    Reviewed: Allergy & Precautions, NPO status , Patient's Chart, lab work & pertinent test results  Airway Mallampati: II  TM Distance: >3 FB Neck ROM: Full    Dental  (+) Dental Advisory Given, Teeth Intact   Pulmonary neg pulmonary ROS,    Pulmonary exam normal breath sounds clear to auscultation       Cardiovascular negative cardio ROS Normal cardiovascular exam Rhythm:Regular Rate:Normal  Stress test 02/02/21 ? The left ventricular ejection fraction is normal (55-65%). ? Nuclear stress EF: 65%. ? There was no ST segment deviation noted during stress. ? No T wave inversion was noted during stress. ? The study is normal. ? This is a low risk study.   Low risk stress nuclear study with normal perfusion and normal left ventricular regional and global systolic function.     Neuro/Psych TIAnegative psych ROS   GI/Hepatic Neg liver ROS, GERD  Medicated and Poorly Controlled,  Endo/Other  Hypothyroidism   Renal/GU Renal InsufficiencyRenal disease  negative genitourinary   Musculoskeletal negative musculoskeletal ROS (+)   Abdominal   Peds negative pediatric ROS (+)  Hematology negative hematology ROS (+)   Anesthesia Other Findings   Reproductive/Obstetrics negative OB ROS                            Anesthesia Physical Anesthesia Plan  ASA: 2  Anesthesia Plan: General   Post-op Pain Management: Dilaudid IV   Induction: Intravenous  PONV Risk Score and Plan: 4 or greater and Ondansetron, Dexamethasone, Midazolam and Scopolamine patch - Pre-op  Airway Management Planned: Oral ETT  Additional Equipment:   Intra-op Plan:   Post-operative Plan: Extubation in OR  Informed Consent: I have reviewed the patients History and Physical, chart, labs and discussed the procedure including the risks,  benefits and alternatives for the proposed anesthesia with the patient or authorized representative who has indicated his/her understanding and acceptance.     Dental advisory given  Plan Discussed with: CRNA and Surgeon  Anesthesia Plan Comments:        Anesthesia Quick Evaluation

## 2021-11-09 ENCOUNTER — Encounter (HOSPITAL_COMMUNITY): Payer: Self-pay | Admitting: Surgery

## 2021-11-09 LAB — SURGICAL PATHOLOGY

## 2021-11-14 ENCOUNTER — Encounter: Payer: Self-pay | Admitting: Registered Nurse

## 2021-11-15 ENCOUNTER — Other Ambulatory Visit: Payer: Self-pay | Admitting: Registered Nurse

## 2021-11-15 DIAGNOSIS — Z9049 Acquired absence of other specified parts of digestive tract: Secondary | ICD-10-CM

## 2021-11-15 DIAGNOSIS — Z91018 Allergy to other foods: Secondary | ICD-10-CM

## 2021-11-16 ENCOUNTER — Other Ambulatory Visit: Payer: Self-pay | Admitting: Registered Nurse

## 2021-11-16 DIAGNOSIS — K5229 Other allergic and dietetic gastroenteritis and colitis: Secondary | ICD-10-CM

## 2021-11-20 ENCOUNTER — Ambulatory Visit (INDEPENDENT_AMBULATORY_CARE_PROVIDER_SITE_OTHER): Payer: 59 | Admitting: Surgery

## 2021-11-20 DIAGNOSIS — Z09 Encounter for follow-up examination after completed treatment for conditions other than malignant neoplasm: Secondary | ICD-10-CM

## 2021-11-20 NOTE — Progress Notes (Signed)
[facility]  I am calling the patient for post operative evaluation. This is not a billable encounter as it is under the [place] charges for the surgery.  The patient had a laparoscopic cholecystectomy on 5/25. The patient reports that she is doing very well since surgery. She is tolerating a diet, having good pain control, and having regular Bms.  The incisions are doing well.  Her skin glue is almost completely off. I advised her that she can use antibiotic ointment to help dissolve the glue. The patient has no concerns.   Pathology: A. GALLBLADDER, CHOLECYSTECTOMY:  Chronic cholecystitis with cholesterolosis  Cholelithiasis   Will see the patient PRN.   Graciella Freer, DO [facility] [address] [place], [place] [address] [phone] (office)

## 2021-11-25 ENCOUNTER — Other Ambulatory Visit: Payer: Self-pay | Admitting: Registered Nurse

## 2021-11-25 DIAGNOSIS — R1011 Right upper quadrant pain: Secondary | ICD-10-CM

## 2022-01-02 ENCOUNTER — Ambulatory Visit
Admission: RE | Admit: 2022-01-02 | Discharge: 2022-01-02 | Disposition: A | Discharge status: Home / Self Care | Payer: 59 | Source: Ambulatory Visit | Attending: Registered Nurse | Admitting: Registered Nurse

## 2022-01-02 DIAGNOSIS — R928 Other abnormal and inconclusive findings on diagnostic imaging of breast: Secondary | ICD-10-CM

## 2022-01-03 ENCOUNTER — Encounter: Payer: Self-pay | Admitting: Nutrition

## 2022-01-03 ENCOUNTER — Encounter: Payer: 59 | Attending: Registered Nurse | Admitting: Nutrition

## 2022-01-03 VITALS — Ht 71.0 in | Wt 165.0 lb

## 2022-01-03 DIAGNOSIS — K9041 Non-celiac gluten sensitivity: Secondary | ICD-10-CM

## 2022-01-03 DIAGNOSIS — K5229 Other allergic and dietetic gastroenteritis and colitis: Secondary | ICD-10-CM | POA: Diagnosis not present

## 2022-01-03 DIAGNOSIS — N183 Chronic kidney disease, stage 3 unspecified: Secondary | ICD-10-CM

## 2022-01-03 DIAGNOSIS — K5790 Diverticulosis of intestine, part unspecified, without perforation or abscess without bleeding: Secondary | ICD-10-CM

## 2022-01-03 DIAGNOSIS — Z713 Dietary counseling and surveillance: Secondary | ICD-10-CM | POA: Insufficient documentation

## 2022-01-03 DIAGNOSIS — K582 Mixed irritable bowel syndrome: Secondary | ICD-10-CM
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
## 2022-01-03 NOTE — Patient Instructions (Signed)
Goals  Follow FODMAP diet for 2 weeks Keep strict food journal with food, emotionions, symptoms and bowls.

## 2022-01-09 ENCOUNTER — Telehealth: Payer: Self-pay | Admitting: *Deleted

## 2022-01-09 ENCOUNTER — Encounter: Payer: Self-pay | Admitting: Registered Nurse

## 2022-01-09 ENCOUNTER — Encounter: Payer: Self-pay | Admitting: Gastroenterology

## 2022-01-09 NOTE — Telephone Encounter (Signed)
Received VM x2 from patient [phone]~ telephone, time-stamped at 3pm and 3:40pm.   Surgical Date: 11/08/2021 Procedure: Lap Chole  Patient reports that she noted black discoloration to umbilical incision site after working in yard on 01/09/2022. States that spouse checked area since she was concerned about ticks. States that once she was assured that area was not a tick, she inserted straight pin into area. Reports copious amounts of purulent drainage noted.   Denies pain, fever/ chills, skin discoloration, swelling, etc.   Given strict precautions to monitor for and to go to UC/ ER if area worsens.  Appointment scheduled for 01/10/2022.

## 2022-01-10 ENCOUNTER — Ambulatory Visit (INDEPENDENT_AMBULATORY_CARE_PROVIDER_SITE_OTHER): Payer: 59 | Admitting: Surgery

## 2022-01-10 ENCOUNTER — Encounter: Payer: Self-pay | Admitting: Surgery

## 2022-01-10 VITALS — BP 104/72 | HR 94 | Temp 98.7°F | Resp 18 | Ht 70.0 in | Wt 163.0 lb

## 2022-01-10 DIAGNOSIS — Z09 Encounter for follow-up examination after completed treatment for conditions other than malignant neoplasm: Secondary | ICD-10-CM

## 2022-01-10 NOTE — Progress Notes (Signed)
[facility] Note   HPI:  56 y.o. Female presents to clinic for post-op follow-up status post laparoscopic cholecystectomy on 5/25.  She states that she has been doing very well postoperatively, but yesterday after doing yard work, she noted a black spot at her umbilical incision.  She initially thought this area was a tick, but it did not appear to be 1.  She poked this area with a sterilized needle, and she described a yellowish drainage from the [facility] site.  She has noticed small amounts of clearish yellowish drainage since being poked.  She denies significant abdominal pain.  She denies nausea and vomiting more than she normally experiences from her GERD.  Her incisions have otherwise been healing well.  She denies fevers and chills.  Review of Systems:  All other review of systems: otherwise negative   Vital Signs:  BP 104/72   Pulse 94   Temp 98.7 F (37.1 C) (Oral)   Resp 18   Ht 5' 10"  (1.778 m)   Wt 163 lb (73.9 kg)   LMP 02/19/2006 (Approximate)   SpO2 95%   BMI 23.39 kg/m    Physical Exam:  Physical Exam Vitals reviewed.  Constitutional:      Appearance: Normal appearance.  Abdominal:     Comments: Abdomen soft, nondistended, no percussion tenderness, nontender to palpation; no rigidity, guarding, rebound tenderness; umbilical incision healing well with small opening and granulation tissue at the right lateral aspect, no active drainage, no surrounding erythema, tenderness, induration, or fluctuance  Neurological:     Mental Status: She is alert.    Laboratory studies: None  Imaging:  None  Assessment:  56 y.o. yo Female who presents for evaluation of umbilical incision status post drainage.  She underwent laparoscopic cholecystectomy on 5/25.  Plan:  -Patient is overall doing very well from a postsurgical standpoint -In looking at her umbilical incision, she likely developed a small pustule that has since drained.   -Given that this area has  since drained, I do not believe she needs any further treatment with antibiotics or I&D -I advised her to apply antibiotic ointment to the open area of her incision and keep this area covered -Advised her to call the office if the area becomes more tender she has increasing drainage, notes erythema, or begins to experience fevers or chills -We will perform a phone follow-up in 2 weeks to verify she is still doing well and this area has healed  All of the above recommendations were discussed with the patient and patient's family, and all of patient's and family's questions were answered to their expressed satisfaction.  Graciella Freer, DO [facility] [address] [place], [place] [address] [phone] (office)

## 2022-01-10 NOTE — Patient Instructions (Signed)
-  Apply antibiotic ointment daily to your umbilical incision -Keep covered with bandaid

## 2022-01-21 ENCOUNTER — Encounter: Payer: Self-pay | Admitting: Nutrition

## 2022-01-21 ENCOUNTER — Encounter: Payer: 59 | Attending: Registered Nurse | Admitting: Nutrition

## 2022-01-21 VITALS — Wt 166.6 lb

## 2022-01-21 DIAGNOSIS — K582 Mixed irritable bowel syndrome: Secondary | ICD-10-CM | POA: Diagnosis present

## 2022-01-21 DIAGNOSIS — K900 Celiac disease: Secondary | ICD-10-CM | POA: Insufficient documentation

## 2022-01-21 DIAGNOSIS — N1831 Chronic kidney disease, stage 3a: Secondary | ICD-10-CM | POA: Diagnosis present

## 2022-01-21 NOTE — Patient Instructions (Signed)
Goals  Maintain food choice with whole plant based foods. Increase physical activity 2-3 times per week. Talk to MD about Effexor alternatives.
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
## 2022-01-22 ENCOUNTER — Encounter: Payer: Self-pay | Admitting: Nutrition

## 2022-01-24 ENCOUNTER — Ambulatory Visit (INDEPENDENT_AMBULATORY_CARE_PROVIDER_SITE_OTHER): Payer: 59 | Admitting: Surgery

## 2022-01-24 DIAGNOSIS — Z09 Encounter for follow-up examination after completed treatment for conditions other than malignant neoplasm: Secondary | ICD-10-CM

## 2022-01-24 NOTE — Progress Notes (Signed)
[facility]  I am calling the patient for post operative evaluation. This is not a billable encounter as it is under the [place] charges for the surgery.  The patient had a laparoscopic cholecystectomy on 5/25.  The patient followed up with me on 7/27 for an area that opened and drained a small amount of pus at her umbilical incision site.  At that time there is no concern for active infection.  Since then, she has been doing well, though the area has not fully healed yet.  She has noted only a small amount of clear orange drainage from this area.  She believes that where her pants sit on her stomach, that it is further irritating the site.  She did also have some blistering as a reaction to the adhesive and a Band-Aid.  Since she did have a skin reaction to a bandage, I recommended that we just let this area closed up on its own without keeping it covered or applying any further antibiotic ointment.  All the patient's questions were answered to her expressed satisfaction.  She will call the office if this area fails to heal or if she has any other concerns.  Will see the patient PRN.   Graciella Freer, DO [facility] [address] [place], [place] [address] [phone] (office)

## 2022-02-13 ENCOUNTER — Telehealth: Payer: Self-pay | Admitting: *Deleted

## 2022-02-14 NOTE — Telephone Encounter (Signed)
LATE DOCUMENTATION FOR 02/13/2022:  Received call from patient [phone]~ telephone.  Surgical Date: 11/08/2021 Procedure: Lap Chole  Patient reports increased fullness and early satiety while eating. Reports that she also noted chest pressure while eating. States that she begins to feel pressure within the first few bites of food. States that she is taking Pepto Bismol or Maalox after every meal. States that she has been adhering to gallbladder diet (low fat, high fiber).   Patient also noted to have pmHx of multiple GI issues including GERD, celiac disease, IBS, gluten intolerance.  Discussed with Dr. Okey Dupre who recommended that patient follow up with GI. Advised that sx do not appear to have been caused by surgery from 11/08/2021.  Advised that if GI rules out gastric issues, imaging can be obtained to rule out post surgical complications, though provider is not concerned for any at this time.   Call placed to patient and patient made aware.  Verbalized understanding.

## 2022-03-14 ENCOUNTER — Ambulatory Visit: Payer: 59 | Admitting: Nutrition

## 2022-04-10 ENCOUNTER — Telehealth: Payer: Self-pay | Admitting: Registered Nurse

## 2022-04-10 NOTE — Telephone Encounter (Signed)
Okay for TOC 

## 2022-04-10 NOTE — Telephone Encounter (Signed)
Yes, okay.

## 2022-04-10 NOTE — Telephone Encounter (Signed)
Request transfer to another [place] PCP  Pt is requesting to transfer FROM:  Maximiano Coss  Pt is requesting to transfer TO: Dr. Anitra Lauth  Reason for requested transfer:  Dr. Has left practice  Best contact number:  [phone]

## 2022-04-28 ENCOUNTER — Other Ambulatory Visit: Payer: Self-pay | Admitting: Obstetrics and Gynecology

## 2022-04-30 NOTE — Telephone Encounter (Signed)
Med refill request:Effexor 75 mg tab Last AEX: 05/03/21/ BS Next AEX: Not scheduled  Last MMG (if hormonal med)  N/A  Rx pended for 30 day supply, no refills,  with note to pharmacy needs updated AEX for future refills.    Refill authorized: Please Advise?

## 2022-05-06 ENCOUNTER — Other Ambulatory Visit (HOSPITAL_COMMUNITY): Payer: Self-pay | Admitting: Registered Nurse

## 2022-05-06 DIAGNOSIS — Z1231 Encounter for screening mammogram for malignant neoplasm of breast: Secondary | ICD-10-CM

## 2022-05-08 ENCOUNTER — Inpatient Hospital Stay (HOSPITAL_COMMUNITY): Admission: RE | Admit: 2022-05-08 | Payer: 59 | Source: Ambulatory Visit

## 2022-05-08 ENCOUNTER — Encounter (HOSPITAL_COMMUNITY): Payer: Self-pay

## 2022-05-15 ENCOUNTER — Encounter: Payer: Self-pay | Admitting: Internal Medicine

## 2022-05-17 DIAGNOSIS — Z419 Encounter for procedure for purposes other than remedying health state, unspecified: Secondary | ICD-10-CM | POA: Diagnosis not present

## 2022-05-27 ENCOUNTER — Other Ambulatory Visit: Payer: Self-pay | Admitting: Obstetrics and Gynecology

## 2022-05-31 ENCOUNTER — Other Ambulatory Visit: Payer: Self-pay | Admitting: Obstetrics and Gynecology

## 2022-05-31 NOTE — Telephone Encounter (Signed)
Patient asked how to wean off Rx? Takes 1 tablet every other day for now for almost 1 month. Patient said she will be scheduling with a new GYN in Jan.

## 2022-05-31 NOTE — Telephone Encounter (Signed)
Patient informed. 

## 2022-05-31 NOTE — Telephone Encounter (Signed)
I recommend she continue taking the Effexor every other day until she is seen by her new provider, who can guide her further based on how she is doing. This medication usually needs to be weaned slowly.  She will need an appointment here for further refills beyond this prescription.

## 2022-05-31 NOTE — Telephone Encounter (Signed)
Last annual exam 04/2021  Not exam scheduled  Left message for patient to call, it appears patient wrote a note on old Rx in chart about decreasing dose.

## 2022-06-12 ENCOUNTER — Ambulatory Visit (HOSPITAL_COMMUNITY)
Admission: RE | Admit: 2022-06-12 | Discharge: 2022-06-12 | Disposition: A | Discharge status: Home / Self Care | Payer: Medicaid Other | Source: Ambulatory Visit | Attending: Registered Nurse | Admitting: Registered Nurse

## 2022-06-12 DIAGNOSIS — Z1231 Encounter for screening mammogram for malignant neoplasm of breast: Secondary | ICD-10-CM | POA: Insufficient documentation

## 2022-06-17 DIAGNOSIS — Z419 Encounter for procedure for purposes other than remedying health state, unspecified: Secondary | ICD-10-CM | POA: Diagnosis not present

## 2022-06-21 ENCOUNTER — Other Ambulatory Visit: Payer: Self-pay

## 2022-06-24 ENCOUNTER — Ambulatory Visit (INDEPENDENT_AMBULATORY_CARE_PROVIDER_SITE_OTHER): Payer: Medicaid Other | Admitting: Family Medicine

## 2022-06-24 ENCOUNTER — Encounter: Payer: Self-pay | Admitting: Family Medicine

## 2022-06-24 VITALS — BP 114/74 | HR 74 | Temp 98.8°F | Ht 71.5 in | Wt 172.6 lb

## 2022-06-24 DIAGNOSIS — Z0000 Encounter for general adult medical examination without abnormal findings: Secondary | ICD-10-CM | POA: Diagnosis not present

## 2022-06-24 DIAGNOSIS — R5382 Chronic fatigue, unspecified: Secondary | ICD-10-CM | POA: Diagnosis not present

## 2022-06-24 NOTE — Progress Notes (Signed)
Office Note 06/24/2022  CC:  Chief Complaint  Patient presents with   Establish Care    Prior PCP, Dr.Morrow    HPI:  Candace Allen is a 57 y.o. White female who is here to establish care Patient's most recent primary MD: Maximiano Coss, NP. Old records were reviewed prior to or during today's visit.  Reports about a 3-year history of fatigue and excessive daytime sleepiness. Has had some chronic GI issues : IBS, which she controls pretty well with dietary changes/FODMAP.  Also, many of her symptoms resolved when she got her gallbladder out earlier this year. Had been seeing a GYN but this ended when they had a disagreement. There is a question of menopausal syndrome but she is at least 10 years past menopause. No abnormal vaginal bleeding since having endometrial ablation in the remote past.  She does snore.  Occasionally wakes herself with a gasp in the night.  Husband has not mentioned any periods of apnea and sleep.  Past Medical History:  Diagnosis Date   Chronic renal insufficiency, stage 3 (moderate) (HCC)    ?d/t dexilant   Diverticulosis 02/19/2018   By colonoscopy 2016   GERD (gastroesophageal reflux disease)    Hx of concussion    Hypothyroidism    IBS (irritable bowel syndrome)    Prediabetes     Past Surgical History:  Procedure Laterality Date   CHOLECYSTECTOMY N/A 11/08/2021   Procedure: LAPAROSCOPIC CHOLECYSTECTOMY;  Surgeon: Rusty Aus, DO;  Location: [facility];  Service: General;  Laterality: N/A;   COLONOSCOPY  2016   2016 no polyps. Rpt at [facility] 2022 per pt, normal, no record available   ENDOMETRIAL ABLATION     SHOULDER SURGERY Left    TRANSTHORACIC ECHOCARDIOGRAM     01/2021 NORMAL   TUBAL LIGATION     UPPER GASTROINTESTINAL ENDOSCOPY  11/10/2019   neg    Family History  Problem Relation Age of Onset   Peripheral Artery Disease Mother    Stroke Mother    Other Father        Hx unknown to patient   Testicular cancer Brother     Esophageal cancer Brother    Early death Brother    Hypotension Maternal Grandmother    Aortic dissection Maternal Grandmother    Heart disease Maternal Grandfather    Diabetes Paternal Grandmother        AODM   Diabetes Paternal Grandfather        AODM   Healthy Daughter    Healthy Son    Ovarian cancer Paternal Great-grandmother     Social History   Socioeconomic History   Marital status: Married    Spouse name: Not on file   Number of children: 2   Years of education: Not on file   Highest education level: Not on file  Occupational History   Occupation: Neurosurgeon  Tobacco Use   Smoking status: Never    Passive exposure: Never   Smokeless tobacco: Never  Vaping Use   Vaping Use: Never used  Substance and Sexual Activity   Alcohol use: Yes    Alcohol/week: 2.0 standard drinks of alcohol    Types: 2 Standard drinks or equivalent per week    Comment: social   Drug use: Never   Sexual activity: Yes    Birth control/protection: Surgical    Comment: Ablation  Other Topics Concern   Not on file  Social History Narrative   Married, 2 children   Educ: 12+.  Grew up in [place].  [place] area approximately 2016.   Occup: publisher->[employer]   No tob   Social Determinants of Radio broadcast assistant Strain: Not on file  Food Insecurity: Not on file  Transportation Needs: Not on file  Physical Activity: Not on file  Stress: Not on file  Social Connections: Not on file  Intimate Partner Violence: Not on file    Outpatient Encounter Medications as of 06/24/2022  Medication Sig   levothyroxine (SYNTHROID) 75 MCG tablet Take 1 tablet (75 mcg total) by mouth daily before breakfast. (Patient taking differently: Take 75 mcg by mouth every other day.)   loratadine (CLARITIN) 10 MG tablet Take 10 mg by mouth daily.   Multiple Vitamins-Minerals (ADULT GUMMY) CHEW Chew 1 capsule by mouth daily.   Sennosides (SENOKOT PO) Take by mouth daily.  2  chewables daily (morning and night)   vitamin B-12 (CYANOCOBALAMIN) 1000 MCG tablet Take 1,000 mcg by mouth daily.   VITAMIN D-VITAMIN K PO Take 1 capsule by mouth daily.   EPINEPHrine 0.3 mg/0.3 mL IJ SOAJ injection Inject 0.3 mg into the muscle as needed for anaphylaxis. (Patient not taking: Reported on 06/24/2022)   [DISCONTINUED] liothyronine (CYTOMEL) 5 MCG tablet Take 1 tablet (5 mcg total) by mouth daily. (Patient not taking: Reported on 06/24/2022)   [DISCONTINUED] venlafaxine XR (EFFEXOR-XR) 75 MG 24 hr capsule TAKE 1 CAPSULE(75 MG) BY MOUTH EVERY OTHER DAY WITH BREAKFAST (Patient not taking: Reported on 06/24/2022)   Facility-Administered Encounter Medications as of 06/24/2022  Medication   dextrose 5 % solution   [DISCONTINUED] 0.9 %  sodium chloride infusion    Allergies  Allergen Reactions   Bee Venom Anaphylaxis   Flagyl [Metronidazole] Anaphylaxis   Iodine Anaphylaxis   Shellfish Allergy Anaphylaxis   Tramadol Hcl Other (See Comments)    Showed signs of stroke   Gluten Meal     Bloating    Lactose Intolerance (Gi) Hives    Bloating,    Melatonin     Nightmares     Review of Systems  Constitutional:  Positive for fatigue. Negative for appetite change, chills and fever.  HENT:  Negative for congestion, dental problem, ear pain and sore throat.   Eyes:  Negative for discharge, redness and visual disturbance.  Respiratory:  Negative for cough, chest tightness, shortness of breath and wheezing.   Cardiovascular:  Negative for chest pain, palpitations and leg swelling.  Gastrointestinal:  Negative for abdominal pain, blood in stool, diarrhea, nausea and vomiting.  Genitourinary:  Negative for difficulty urinating, dysuria, flank pain, frequency, hematuria and urgency.  Musculoskeletal:  Negative for arthralgias, back pain, joint swelling, myalgias and neck stiffness.  Skin:  Negative for pallor and rash.  Neurological:  Negative for dizziness, speech difficulty, weakness and  headaches.  Hematological:  Negative for adenopathy. Does not bruise/bleed easily.  Psychiatric/Behavioral:  Negative for confusion and sleep disturbance. The patient is not nervous/anxious.     PE; Blood pressure 114/74, pulse 74, temperature 98.8 F (37.1 C), height 5' 11.5" (1.816 m), weight 172 lb 9.6 oz (78.3 kg), last menstrual period 02/19/2006, SpO2 95 %.Body mass index is 23.74 kg/m.  Exam chaperoned by Deveron Furlong, CMA.  Physical Exam  Gen: Alert, well appearing.  Patient is oriented to person, place, time, and situation. AFFECT: pleasant, lucid thought and speech. ENT: Ears: EACs clear, normal epithelium.  TMs with good light reflex and landmarks bilaterally.  Eyes: no injection, icteris, swelling, or exudate.  EOMI, PERRLA.  Nose: no drainage or turbinate edema/swelling.  No injection or focal lesion.  Mouth: lips without lesion/swelling.  Oral mucosa pink and moist.  Dentition intact and without obvious caries or gingival swelling.  Oropharynx without erythema, exudate, or swelling.  Neck: supple/nontender.  No LAD, mass, or TM.  Carotid pulses 2+ bilaterally, without bruits. CV: RRR, no m/r/g.   LUNGS: CTA bilat, nonlabored resps, good aeration in all lung fields. ABD: soft, NT, ND, BS normal.  No hepatospenomegaly or mass.  No bruits. EXT: no clubbing, cyanosis, or edema.  Musculoskeletal: no joint swelling, erythema, warmth, or tenderness.  ROM of all joints intact. Skin - no sores or suspicious lesions or rashes or color changes  Pertinent labs:  Last CBC Lab Results  Component Value Date   WBC 5.7 10/25/2021   HGB 13.2 10/25/2021   HCT 39.9 10/25/2021   MCV 95.2 10/25/2021   MCH 31.5 10/25/2021   RDW 12.0 10/25/2021   PLT 227 35/00/9381   Last metabolic panel Lab Results  Component Value Date   GLUCOSE 89 10/25/2021   NA 138 10/25/2021   K 3.7 10/25/2021   CL 103 10/25/2021   CO2 28 10/25/2021   BUN 11 10/25/2021   CREATININE 1.19 (H) 10/25/2021    GFRNONAA 53 (L) 10/25/2021   CALCIUM 9.7 10/25/2021   PROT 7.5 10/25/2021   ALBUMIN 4.5 10/25/2021   BILITOT 0.8 10/25/2021   ALKPHOS 83 10/25/2021   AST 38 10/25/2021   ALT 33 10/25/2021   ANIONGAP 7 10/25/2021   Last lipids Lab Results  Component Value Date   CHOL 179 10/21/2020   HDL 63 10/21/2020   LDLCALC 101 (H) 10/21/2020   TRIG 77 10/21/2020   CHOLHDL 2.8 10/21/2020   Last hemoglobin A1c Lab Results  Component Value Date   HGBA1C 5.2 10/21/2020   Last thyroid functions Lab Results  Component Value Date   TSH 0.97 09/13/2021   Last vitamin D Lab Results  Component Value Date   VD25OH 84.65 09/13/2021   Last vitamin B12 and Folate Lab Results  Component Value Date   VITAMINB12 1,409 (H) 09/13/2021   ASSESSMENT AND PLAN:   New patient, establishing care.  1.  Health maintenance exam: Reviewed age and gender appropriate health maintenance issues (prudent diet, regular exercise, health risks of tobacco and excessive alcohol, use of seatbelts, fire alarms in home, use of sunscreen).  Also reviewed age and gender appropriate health screening as well as vaccine recommendations. Vaccines: she declined shingrix->she is hesitant to do ANY vaccines due to her history of sensitivity to medications/food allergies. Labs: Fasting health panel, iron panel, vitamin B12 level today. Cervical ca screening: No longer seeing a GYN. We need to develop a plan for ongoing cervical cancer screening.  No pelvic exam or Pap smear was done today. Breast ca screening: Mammogram up-to-date 05/2022. Colon ca screening: Colonoscopy 2016 without polyps.  She says she got a colonoscopy again around 2022 when she was getting a GI workup at [facility].  No records available at this time.  #2 chronic fatigue.  We only discussed this a little bit today and decided to have her back to discuss in detail. Pertinent information today is some symptoms of obstructive sleep apnea. See #1 for labs.  #3  hypothyroidism, question of. No history of TSH abnormalities in the chart.  Patient states that she has never been told why she was started on thyroid medication. She has discontinued the Cytomel that endocrinology started her on--last dose approximately  3 weeks ago. She has decreased her levothyroxine to a 75 mcg tab every other day. Checking TSH today. Will likely continue to wean her off of this.  An After Visit Summary was printed and given to the patient.  Return for at earliest convenience for fatigue.  Signed:  Crissie Sickles, MD           06/24/2022

## 2022-06-24 NOTE — Patient Instructions (Signed)

## 2022-06-25 ENCOUNTER — Encounter: Payer: Self-pay | Admitting: Family Medicine

## 2022-06-25 LAB — CBC WITH DIFFERENTIAL/PLATELET
Absolute Monocytes: 381 cells/uL (ref 200–950)
Basophils Absolute: 61 cells/uL (ref 0–200)
Basophils Relative: 1.3 %
Eosinophils Absolute: 150 cells/uL (ref 15–500)
Eosinophils Relative: 3.2 %
HCT: 40.9 % (ref 35.0–45.0)
Hemoglobin: 14.1 g/dL (ref 11.7–15.5)
Lymphs Abs: 1786 cells/uL (ref 850–3900)
MCH: 31.9 pg (ref 27.0–33.0)
MCHC: 34.5 g/dL (ref 32.0–36.0)
MCV: 92.5 fL (ref 80.0–100.0)
MPV: 11 fL (ref 7.5–12.5)
Monocytes Relative: 8.1 %
Neutro Abs: 2322 cells/uL (ref 1500–7800)
Neutrophils Relative %: 49.4 %
Platelets: 276 10*3/uL (ref 140–400)
RBC: 4.42 10*6/uL (ref 3.80–5.10)
RDW: 12.2 % (ref 11.0–15.0)
Total Lymphocyte: 38 %
WBC: 4.7 10*3/uL (ref 3.8–10.8)

## 2022-06-25 LAB — LIPID PANEL
Cholesterol: 264 mg/dL — ABNORMAL HIGH (ref <200)
HDL: 82 mg/dL (ref >=50)
LDL Cholesterol (Calc): 163 mg/dL (calc) — ABNORMAL HIGH
Non-HDL Cholesterol (Calc): 182 mg/dL (calc) — ABNORMAL HIGH (ref <130)
Total CHOL/HDL Ratio: 3.2 (calc) (ref <5.0)
Triglycerides: 87 mg/dL (ref <150)

## 2022-06-25 LAB — TSH: TSH: 1.47 mIU/L (ref 0.40–4.50)

## 2022-06-25 LAB — COMPREHENSIVE METABOLIC PANEL
AG Ratio: 1.7 (calc) (ref 1.0–2.5)
ALT: 32 U/L — ABNORMAL HIGH (ref 6–29)
AST: 30 U/L (ref 10–35)
Albumin: 4.6 g/dL (ref 3.6–5.1)
Alkaline phosphatase (APISO): 92 U/L (ref 37–153)
BUN/Creatinine Ratio: 11 (calc) (ref 6–22)
BUN: 13 mg/dL (ref 7–25)
CO2: 30 mmol/L (ref 20–32)
Calcium: 10 mg/dL (ref 8.6–10.4)
Chloride: 103 mmol/L (ref 98–110)
Creat: 1.18 mg/dL — ABNORMAL HIGH (ref 0.50–1.03)
Globulin: 2.7 g/dL (calc) (ref 1.9–3.7)
Glucose, Bld: 78 mg/dL (ref 65–99)
Potassium: 4.6 mmol/L (ref 3.5–5.3)
Sodium: 140 mmol/L (ref 135–146)
Total Bilirubin: 0.6 mg/dL (ref 0.2–1.2)
Total Protein: 7.3 g/dL (ref 6.1–8.1)

## 2022-06-25 LAB — IRON,TIBC AND FERRITIN PANEL
%SAT: 35 % (calc) (ref 16–45)
Ferritin: 67 ng/mL (ref 16–232)
Iron: 124 ug/dL (ref 45–160)
TIBC: 354 mcg/dL (calc) (ref 250–450)

## 2022-06-25 LAB — VITAMIN B12: Vitamin B-12: 1131 pg/mL — ABNORMAL HIGH (ref 200–1100)

## 2022-07-01 ENCOUNTER — Ambulatory Visit (INDEPENDENT_AMBULATORY_CARE_PROVIDER_SITE_OTHER): Payer: Medicaid Other | Admitting: Family Medicine

## 2022-07-01 ENCOUNTER — Encounter: Payer: Self-pay | Admitting: Family Medicine

## 2022-07-01 VITALS — BP 116/78 | HR 76 | Temp 98.1°F | Ht 71.5 in | Wt 172.8 lb

## 2022-07-01 DIAGNOSIS — R5382 Chronic fatigue, unspecified: Secondary | ICD-10-CM

## 2022-07-01 DIAGNOSIS — F902 Attention-deficit hyperactivity disorder, combined type: Secondary | ICD-10-CM | POA: Diagnosis not present

## 2022-07-01 MED ORDER — LISDEXAMFETAMINE DIMESYLATE 20 MG PO CAPS: 20.0000 mg | ORAL_CAPSULE | Freq: Every day | ORAL | 0 refills | Status: DC

## 2022-07-01 MED ORDER — VENLAFAXINE HCL ER 37.5 MG PO CP24: 37.5000 mg | ORAL_CAPSULE | Freq: Every day | ORAL | 1 refills | Status: DC

## 2022-07-01 NOTE — Telephone Encounter (Signed)
Okay, we will start with Vyvanse 20 mg once a day Expect this to last about 10-12 hours. This is the beginning/lowest dose. We can certainly increase in future if you tolerate this well.

## 2022-07-01 NOTE — Progress Notes (Signed)
OFFICE VISIT  07/01/2022  CC:  Chief Complaint  Patient presents with   Fatigue    Patient is a 57 y.o. female who presents for chronic fatigue.  HPI: Candace Allen has had troubles with focus and concentration as long as she can remember.  Multitasking gets her irritable and she tends to not finish things, at least not near schedule. Problems with organization, remembering appointments and obligations, procrastinates.  Feels internally "driven by a motor".  Finds it hard to stay still and do nothing. All these things make her feel overwhelmed a lot of the time, likely contributing to her feeling of fatigue and exhaustion.  She does snore. Occasionally wakes herself with a gasp in the night. Husband has not mentioned any periods of apnea in sleep.  Reviewed recent labs today with patient: All normal except renal insufficiency, which is stable.  She is trying to wean herself off Effexor XR has 75 mg tabs and is trying to use these every other day but notes some difficulty with his regimen.  Past Medical History:  Diagnosis Date   Chronic renal insufficiency, stage 3 (moderate) (HCC)    ?d/t dexilant   Diverticulosis 02/19/2018   By colonoscopy 2016   GERD (gastroesophageal reflux disease)    Hx of concussion    Hypothyroidism    question of-->no hx of thyroid lab abnormalities in record   IBS (irritable bowel syndrome)     Past Surgical History:  Procedure Laterality Date   CHOLECYSTECTOMY N/A 11/08/2021   Procedure: LAPAROSCOPIC CHOLECYSTECTOMY;  Surgeon: Rusty Aus, DO;  Location: [facility];  Service: General;  Laterality: N/A;   COLONOSCOPY  2016   2016 no polyps. Rpt at [facility] 2022 per pt, normal, no record available   ENDOMETRIAL ABLATION     SHOULDER SURGERY Left    TRANSTHORACIC ECHOCARDIOGRAM     01/2021 NORMAL   TUBAL LIGATION     UPPER GASTROINTESTINAL ENDOSCOPY  11/10/2019   neg    Outpatient Medications Prior to Visit  Medication Sig Dispense Refill    loratadine (CLARITIN) 10 MG tablet Take 10 mg by mouth daily.     Multiple Vitamins-Minerals (ADULT GUMMY) CHEW Chew 1 capsule by mouth daily.     Sennosides (SENOKOT PO) Take by mouth daily.  2 chewables daily (morning and night)     vitamin B-12 (CYANOCOBALAMIN) 1000 MCG tablet Take 1,000 mcg by mouth daily.     VITAMIN D-VITAMIN K PO Take 1 capsule by mouth daily.     EPINEPHrine 0.3 mg/0.3 mL IJ SOAJ injection Inject 0.3 mg into the muscle as needed for anaphylaxis. (Patient not taking: Reported on 06/24/2022) 2 each 0   levothyroxine (SYNTHROID) 75 MCG tablet Take 1 tablet (75 mcg total) by mouth daily before breakfast. (Patient not taking: Reported on 07/01/2022) 90 tablet 3   Facility-Administered Medications Prior to Visit  Medication Dose Route Frequency Provider Last Rate Last Admin   dextrose 5 % solution   Intravenous Continuous Armbruster, Carlota Raspberry, MD        Allergies  Allergen Reactions   Bee Venom Anaphylaxis   Flagyl [Metronidazole] Anaphylaxis   Iodine Anaphylaxis   Shellfish Allergy Anaphylaxis   Tramadol Hcl Other (See Comments)    Showed signs of stroke   Gluten Meal     Bloating    Lactose Intolerance (Gi) Hives    Bloating,    Melatonin     Nightmares     Review of Systems  As per HPI  PE:  07/01/2022    9:52 AM 06/24/2022    9:10 AM 01/21/2022    9:27 AM  Vitals with BMI  Height 5' 11.5" 5' 11.5"   Weight 172 lbs 13 oz 172 lbs 10 oz 166 lbs 10 oz  BMI 23.77 78.58 85.0  Systolic 277 412   Diastolic 78 74   Pulse 76 74      Physical Exam  Gen: Alert, well appearing.  Patient is oriented to person, place, time, and situation. AFFECT: pleasant, lucid thought and speech. No further exam today  LABS:  Last CBC Lab Results  Component Value Date   WBC 4.7 06/24/2022   HGB 14.1 06/24/2022   HCT 40.9 06/24/2022   MCV 92.5 06/24/2022   MCH 31.9 06/24/2022   RDW 12.2 06/24/2022   PLT 276 06/24/2022   Lab Results  Component Value Date   IRON  124 06/24/2022   TIBC 354 06/24/2022   FERRITIN 67 87/86/7672   Last metabolic panel Lab Results  Component Value Date   GLUCOSE 78 06/24/2022   NA 140 06/24/2022   K 4.6 06/24/2022   CL 103 06/24/2022   CO2 30 06/24/2022   BUN 13 06/24/2022   CREATININE 1.18 (H) 06/24/2022   GFRNONAA 53 (L) 10/25/2021   CALCIUM 10.0 06/24/2022   PROT 7.3 06/24/2022   ALBUMIN 4.5 10/25/2021   BILITOT 0.6 06/24/2022   ALKPHOS 83 10/25/2021   AST 30 06/24/2022   ALT 32 (H) 06/24/2022   ANIONGAP 7 10/25/2021   Last lipids Lab Results  Component Value Date   CHOL 264 (H) 06/24/2022   HDL 82 06/24/2022   LDLCALC 163 (H) 06/24/2022   TRIG 87 06/24/2022   CHOLHDL 3.2 06/24/2022   Last hemoglobin A1c Lab Results  Component Value Date   HGBA1C 5.2 10/21/2020   Last thyroid functions Lab Results  Component Value Date   TSH 1.47 06/24/2022   Last vitamin D Lab Results  Component Value Date   VD25OH 84.65 09/13/2021   Last vitamin B12 and Folate Lab Results  Component Value Date   VITAMINB12 1,131 (H) 06/24/2022   IMPRESSION AND PLAN:  #1 ADHD: Start trial of stimulant.  I discussed with Dianca today the fact that insurance coverage dictates which medication we can prescribe so I gave her a list to check with her insurer about today: Vyvanse, any form of long-acting methylphenidate, Adderall, Mydayis.  She will let me know.  #2 chronic fatigue. Question contribution of obstructive sleep apnea as well as #1 above. At 1 point she was put on Effexor, possibly for treatment of the symptoms with the thought that they were perimenopausal symptoms. She noted no improvement with this medication.  Plan is to continue wean off effexor XR :  rx for 37.'5mg'$  tabs today, slowly transition from daily dose to every other day dose and d/c as able. Will see how she does with starting stimulant for ADHD before pursuing further workup for possible obstructive sleep apnea.  #3 question of  hypothyroidism. Again, diagnosis very unclear in the past.  Patient did not know why she got started on the levothyroxine. Medication has been discontinued now. TSH 1.3 about 1 week ago.  An After Visit Summary was printed and given to the patient.  FOLLOW UP: Return in about 2 weeks (around 07/15/2022) for f/u add.  Signed:  Crissie Sickles, MD           07/01/2022

## 2022-07-01 NOTE — Patient Instructions (Signed)
Check with your insurer about coverage for: Vyvanse  Concerta (or any methylphenidate formulation such as metadate, Ritalin LA, daytrana) Adderall Mydayis

## 2022-07-02 NOTE — Telephone Encounter (Signed)
Contacting pharmacy to confirm if prescription received.

## 2022-07-03 NOTE — Telephone Encounter (Signed)
Lisdexamfetamine is the generic name for vyvanse.  Is this what you are seeing on [place]?

## 2022-07-04 MED ORDER — LISDEXAMFETAMINE DIMESYLATE 20 MG PO CAPS
20.0000 mg | ORAL_CAPSULE | Freq: Every day | ORAL | 0 refills | Status: DC
Start: 2022-07-04 — End: 2022-08-05

## 2022-07-04 NOTE — Telephone Encounter (Signed)
Yes->Lisdexamfetamine is correct. I will send rx to whatever pharmacy you choose or if you prefer a printed prescription that is fine, let me know. -PM

## 2022-07-04 NOTE — Telephone Encounter (Signed)
Ok, rx sent

## 2022-07-05 ENCOUNTER — Telehealth: Payer: Self-pay | Admitting: Family Medicine

## 2022-07-05 NOTE — Telephone Encounter (Signed)
Patient also sent mychart message regarding concerns. Her message was forwarded to Prior Auth team to assist with PA.

## 2022-07-05 NOTE — Telephone Encounter (Signed)
Pt called and is a little upset about her medication (Vyvanse) is needing a PA. She reports that she did the leg work to see if any of the five medications that were suggested to her would be covered and find a pharmacy that had the medication in stock. She finally found one that has  it in stock and is now being told by the pharmacy that a PA is needed prior to filling the prescription. I informed her that we no longer handle PA's in office and that another team will be handling this and that it could take up to another week. She then says are you kidding me and ask if Dr. Anitra Lauth could give her a call.

## 2022-07-09 ENCOUNTER — Other Ambulatory Visit (HOSPITAL_COMMUNITY): Payer: Self-pay

## 2022-07-09 NOTE — Telephone Encounter (Signed)
Patient Advocate Encounter   Received notification from [facility] that prior authorization for Lisdexamfetamine Dimesylate is required.   PA submitted on 07/09/2022 Key [id number] Status is pending

## 2022-07-10 NOTE — Telephone Encounter (Signed)
Spoke with pharmacist at [facility], medication is still showing as PA needed and advised to contact [facility] [place] to cancel rx. [place] location and they do not have the medication in stock and currently would use discount coupon. Called [address] back and advised again that PA was approved as of today. They will continue to try and process but not going thru currently. Will inform patient.

## 2022-07-15 ENCOUNTER — Ambulatory Visit: Payer: Medicaid Other | Admitting: Family Medicine

## 2022-07-18 DIAGNOSIS — Z419 Encounter for procedure for purposes other than remedying health state, unspecified: Secondary | ICD-10-CM | POA: Diagnosis not present

## 2022-07-24 ENCOUNTER — Other Ambulatory Visit: Payer: Self-pay | Admitting: Family Medicine

## 2022-07-28 ENCOUNTER — Other Ambulatory Visit: Payer: Self-pay | Admitting: Obstetrics and Gynecology

## 2022-07-29 NOTE — Telephone Encounter (Signed)
FYI. Last AEX on 05/03/2021--recall was sent in 2023. However, pt has not been scheduled.   Looks like PCP has taken over care for this pt regarding effexor Rx. PCP d/c 70m dose in 06/2022 and rxd 37.5 mg dose.  Will refuse this request.

## 2022-08-05 ENCOUNTER — Encounter: Payer: Self-pay | Admitting: Family Medicine

## 2022-08-05 MED ORDER — LISDEXAMFETAMINE DIMESYLATE 20 MG PO CAPS: 20.0000 mg | ORAL_CAPSULE | Freq: Every day | ORAL | 0 refills | Status: DC

## 2022-08-05 NOTE — Telephone Encounter (Signed)
Requesting: vyvanse Contract: n/a UDS: n/a Last Visit: 07/01/22 Next Visit: 08/15/22 Last Refill: 07/04/22 (30,0)  Please Advise. Med pending

## 2022-08-08 ENCOUNTER — Encounter: Payer: Self-pay | Admitting: Family Medicine

## 2022-08-09 MED ORDER — FAMOTIDINE 40 MG PO TABS: ORAL_TABLET | ORAL | 1 refills | Status: DC

## 2022-08-09 NOTE — Telephone Encounter (Signed)
Maximum strength Pepcid prescription sent to her pharmacy

## 2022-08-15 ENCOUNTER — Ambulatory Visit (INDEPENDENT_AMBULATORY_CARE_PROVIDER_SITE_OTHER): Payer: Medicaid Other | Admitting: Family Medicine

## 2022-08-15 ENCOUNTER — Encounter: Payer: Self-pay | Admitting: Family Medicine

## 2022-08-15 VITALS — BP 122/83 | HR 76 | Temp 98.0°F | Ht 71.5 in | Wt 171.2 lb

## 2022-08-15 DIAGNOSIS — F988 Other specified behavioral and emotional disorders with onset usually occurring in childhood and adolescence: Secondary | ICD-10-CM

## 2022-08-15 DIAGNOSIS — K582 Mixed irritable bowel syndrome: Secondary | ICD-10-CM

## 2022-08-15 DIAGNOSIS — E039 Hypothyroidism, unspecified: Secondary | ICD-10-CM

## 2022-08-15 DIAGNOSIS — T50905D Adverse effect of unspecified drugs, medicaments and biological substances, subsequent encounter: Secondary | ICD-10-CM

## 2022-08-15 MED ORDER — LISDEXAMFETAMINE DIMESYLATE 30 MG PO CAPS: 30.0000 mg | ORAL_CAPSULE | Freq: Every day | ORAL | 0 refills | Status: DC

## 2022-08-15 MED ORDER — LUBIPROSTONE 8 MCG PO CAPS: 8.0000 ug | ORAL_CAPSULE | Freq: Two times a day (BID) | ORAL | 1 refills | Status: DC

## 2022-08-15 NOTE — Progress Notes (Signed)
OFFICE VISIT  08/15/2022  CC:  Chief Complaint  Patient presents with   Medical Management of Chronic Issues    Patient is a 58 y.o. female who presents for 6-week follow-up adult ADD. A/P as of last visit: "1 ADHD: Start trial of stimulant.  I discussed with Kande today the fact that insurance coverage dictates which medication we can prescribe so I gave her a list to check with her insurer about today: Vyvanse, any form of long-acting methylphenidate, Adderall, Mydayis.  She will let me know.   #2 chronic fatigue. Question contribution of obstructive sleep apnea as well as #1 above. At 1 point she was put on Effexor, possibly for treatment of the symptoms with the thought that they were perimenopausal symptoms. She noted no improvement with this medication.  Plan is to continue wean off effexor XR :  rx for 37.'5mg'$  tabs today, slowly transition from daily dose to every other day dose and d/c as able. Will see how she does with starting stimulant for ADHD before pursuing further workup for possible obstructive sleep apnea.   #3 question of hypothyroidism. Again, diagnosis very unclear in the past.  Patient did not know why she got started on the levothyroxine. Medication has been discontinued now. TSH 1.3 about 1 week ago.  INTERIM HX:  Pt states all is going better with the med at current dosing--Vyvanse 20 mg a day: Somewhat improved focus, concentration, task completion.  Less frustration, better multitasking, less impulsivity and restlessness.  Mood is stable. No side effects from the medication.  She is on the venlafaxine every other day and tried going to every 3 days but got pretty irritable.  Long history of IBS.  Mostly constipation type. Senna has worked in the past but recently she has increased her dose up to 5 tabs a day and still goes days at a time without a bowel movement and when she does have a bowel movement they are very small hard pebbles. Continuously feels  general abdominal discomfort and feels like she needs to have BM.  No diarrhea lately.  Appetite is good.  No nausea or vomiting.  No blood in stool.   PMP AWARE reviewed today: most recent rx for Vyvanse 20 mg capsule was filled 08/05/2022, # 30, rx by me. No red flags.  ROS as above, plus--> no fevers, no CP, no SOB, no wheezing, no cough, no dizziness, no HAs, no rashes, no melena/hematochezia.  No polyuria or polydipsia.  No myalgias or arthralgias.  No focal weakness, paresthesias, or tremors.  No acute vision or hearing abnormalities.  No dysuria or unusual/new urinary urgency or frequency.  No recent changes in lower legs. No palpitations.    Past Medical History:  Diagnosis Date   Chronic renal insufficiency, stage 3 (moderate) (HCC)    ?d/t dexilant   Diverticulosis 02/19/2018   By colonoscopy 2016   GERD (gastroesophageal reflux disease)    Hx of concussion    Hypothyroidism    question of-->no hx of thyroid lab abnormalities in record   IBS (irritable bowel syndrome)     Past Surgical History:  Procedure Laterality Date   CHOLECYSTECTOMY N/A 11/08/2021   Procedure: LAPAROSCOPIC CHOLECYSTECTOMY;  Surgeon: Rusty Aus, DO;  Location: [facility];  Service: General;  Laterality: N/A;   COLONOSCOPY  2016   2016 no polyps. Rpt at [facility] 2022 per pt, normal, no record available   ENDOMETRIAL ABLATION     SHOULDER SURGERY Left    TRANSTHORACIC ECHOCARDIOGRAM  01/2021 NORMAL   TUBAL LIGATION     UPPER GASTROINTESTINAL ENDOSCOPY  11/10/2019   neg    Outpatient Medications Prior to Visit  Medication Sig Dispense Refill   famotidine (PEPCID) 40 MG tablet 1 tab po bid as needed for heartburn/reflux 60 tablet 1   loratadine (CLARITIN) 10 MG tablet Take 10 mg by mouth daily.     Multiple Vitamins-Minerals (ADULT GUMMY) CHEW Chew 1 capsule by mouth daily.     Sennosides (SENOKOT PO) Take by mouth daily.  2 chewables daily (morning and night)     venlafaxine XR (EFFEXOR  XR) 37.5 MG 24 hr capsule Take 1 capsule (37.5 mg total) by mouth daily with breakfast. 30 capsule 1   VITAMIN D-VITAMIN K PO Take 1 capsule by mouth daily.     lisdexamfetamine (VYVANSE) 20 MG capsule Take 1 capsule (20 mg total) by mouth daily. 30 capsule 0   EPINEPHrine 0.3 mg/0.3 mL IJ SOAJ injection Inject 0.3 mg into the muscle as needed for anaphylaxis. (Patient not taking: Reported on 06/24/2022) 2 each 0   levothyroxine (SYNTHROID) 75 MCG tablet Take 1 tablet (75 mcg total) by mouth daily before breakfast. (Patient not taking: Reported on 07/01/2022) 90 tablet 3   vitamin B-12 (CYANOCOBALAMIN) 1000 MCG tablet Take 1,000 mcg by mouth daily.     Facility-Administered Medications Prior to Visit  Medication Dose Route Frequency Provider Last Rate Last Admin   dextrose 5 % solution   Intravenous Continuous Armbruster, Carlota Raspberry, MD        Allergies  Allergen Reactions   Bee Venom Anaphylaxis   Flagyl [Metronidazole] Anaphylaxis   Iodine Anaphylaxis   Shellfish Allergy Anaphylaxis   Tramadol Hcl Other (See Comments)    Showed signs of stroke   Gluten Meal     Bloating    Lactose Intolerance (Gi) Hives    Bloating,    Melatonin     Nightmares     Review of Systems As per HPI  PE:    08/15/2022   11:04 AM 08/15/2022   10:46 AM 07/01/2022    9:52 AM  Vitals with BMI  Height  5' 11.5" 5' 11.5"  Weight  171 lbs 3 oz 172 lbs 13 oz  BMI  [id number]  Systolic [id number]  Diastolic 83 88 78  Pulse  76 76     Physical Exam  Gen: Alert, well appearing.  Patient is oriented to person, place, time, and situation. AFFECT: pleasant, lucid thought and speech. No further exam today  LABS:  Last metabolic panel Lab Results  Component Value Date   GLUCOSE 78 06/24/2022   NA 140 06/24/2022   K 4.6 06/24/2022   CL 103 06/24/2022   CO2 30 06/24/2022   BUN 13 06/24/2022   CREATININE 1.18 (H) 06/24/2022   GFRNONAA 53 (L) 10/25/2021   CALCIUM 10.0 06/24/2022   PROT 7.3  06/24/2022   ALBUMIN 4.5 10/25/2021   BILITOT 0.6 06/24/2022   ALKPHOS 83 10/25/2021   AST 30 06/24/2022   ALT 32 (H) 06/24/2022   ANIONGAP 7 10/25/2021   IMPRESSION AND PLAN:  #1 adult ADD. Suboptimal response to Vyvanse at the 20 mg dose. Increase to 30 mg daily dose.  2.  IBS with both diarrhea and constipation.  Lately has been the significant/severe constipation stage.  Will have her stop Senokot for a while. Will do trial of Amitiza 8 mcg twice a day.  #3 medication side effect--venlafaxine suspected to have been  causing fatigue. She is doing a slow wean off of this medication.  An After Visit Summary was printed and given to the patient.  FOLLOW UP: Return for 4-6 wks f/u ADD and IBS/constipation.  Signed:  Crissie Sickles, MD           08/15/2022

## 2022-08-16 DIAGNOSIS — Z419 Encounter for procedure for purposes other than remedying health state, unspecified: Secondary | ICD-10-CM | POA: Diagnosis not present

## 2022-08-20 ENCOUNTER — Encounter: Payer: Self-pay | Admitting: Family Medicine

## 2022-08-20 NOTE — Telephone Encounter (Signed)
Please advise update on PA received

## 2022-08-23 ENCOUNTER — Telehealth: Payer: Self-pay

## 2022-08-23 ENCOUNTER — Encounter: Payer: Self-pay | Admitting: Family Medicine

## 2022-08-23 MED ORDER — LINACLOTIDE 290 MCG PO CAPS: 290.0000 ug | ORAL_CAPSULE | Freq: Every day | ORAL | 1 refills | Status: DC

## 2022-08-23 NOTE — Telephone Encounter (Addendum)
Denied. Per the health plan's preferred drug list, at least 1 preferred drugs must be tried before requesting this drug or tell us why the member cannot try any preferred alternatives. Please send [place] supporting chart notes and lab results. Here is list of preferred alternatives: Linzess capsules. Note: Some preferred drug(s) may have quantity limits. Refer to the health plan's preferred drug list for additional details.   Precious with Medicaid office trying to contact us regarding this medication but unfortunately call disconnected prior to transfer.  PA sent via covermymed on 08/23/22   Key: [id number]) [id number]   Medication: lubiprostone (AMITIZA) 8 MCG capsule    Dx: [id number], Irritable bowel syndrome with both constipation and diarrhea    Per Dr. Anitra Lauth pt has tried and failed otc Senokot tabs   Waiting for response.

## 2022-08-23 NOTE — Addendum Note (Signed)
Addended by: Tammi Sou on: 08/23/2022 03:31 PM   Modules accepted: Orders

## 2022-08-23 NOTE — Telephone Encounter (Signed)
LVM for pt regarding medication alternative.  Note: if pt returns call, please inform PA for Amitiza was denied due to alternative not being tried first, Linzess was sent in to her pharmacy.

## 2022-08-23 NOTE — Telephone Encounter (Signed)
OK, linzess sent

## 2022-09-07 ENCOUNTER — Other Ambulatory Visit: Payer: Self-pay | Admitting: Family Medicine

## 2022-09-16 DIAGNOSIS — Z419 Encounter for procedure for purposes other than remedying health state, unspecified: Secondary | ICD-10-CM | POA: Diagnosis not present

## 2022-09-17 ENCOUNTER — Ambulatory Visit: Payer: 59 | Admitting: Internal Medicine

## 2022-09-19 ENCOUNTER — Ambulatory Visit (INDEPENDENT_AMBULATORY_CARE_PROVIDER_SITE_OTHER): Payer: Medicaid Other | Admitting: Family Medicine

## 2022-09-19 ENCOUNTER — Encounter: Payer: Self-pay | Admitting: Family Medicine

## 2022-09-19 ENCOUNTER — Other Ambulatory Visit: Payer: Self-pay

## 2022-09-19 VITALS — BP 125/84 | HR 85 | Wt 170.4 lb

## 2022-09-19 DIAGNOSIS — K582 Mixed irritable bowel syndrome: Secondary | ICD-10-CM

## 2022-09-19 DIAGNOSIS — F988 Other specified behavioral and emotional disorders with onset usually occurring in childhood and adolescence: Secondary | ICD-10-CM

## 2022-09-19 DIAGNOSIS — G8929 Other chronic pain: Secondary | ICD-10-CM

## 2022-09-19 DIAGNOSIS — M25551 Pain in right hip: Secondary | ICD-10-CM

## 2022-09-19 DIAGNOSIS — M25562 Pain in left knee: Secondary | ICD-10-CM | POA: Diagnosis not present

## 2022-09-19 DIAGNOSIS — M222X2 Patellofemoral disorders, left knee: Secondary | ICD-10-CM | POA: Diagnosis not present

## 2022-09-19 MED ORDER — EPINEPHRINE 0.3 MG/0.3ML IJ SOAJ: 0.3000 mg | INTRAMUSCULAR | 0 refills | Status: DC | PRN

## 2022-09-19 MED ORDER — VENLAFAXINE HCL ER 37.5 MG PO CP24: 37.5000 mg | ORAL_CAPSULE | Freq: Every day | ORAL | 1 refills | Status: DC

## 2022-09-19 MED ORDER — LINACLOTIDE 290 MCG PO CAPS: 290.0000 ug | ORAL_CAPSULE | Freq: Every day | ORAL | 3 refills | Status: DC

## 2022-09-19 MED ORDER — FAMOTIDINE 40 MG PO TABS: ORAL_TABLET | ORAL | 1 refills | Status: DC

## 2022-09-19 MED ORDER — LISDEXAMFETAMINE DIMESYLATE 40 MG PO CAPS: 40.0000 mg | ORAL_CAPSULE | ORAL | 0 refills | Status: DC

## 2022-09-19 NOTE — Progress Notes (Signed)
OFFICE VISIT  09/19/2022  CC:  Chief Complaint  Patient presents with   Follow-up    5 week follow up. She has been experiencing joint pain mainly in her knee and hip that wakes her up in the middle of the night.    Patient is a 57 y.o. female who presents for 5-week follow-up adult ADD and IBS. A/P as of last visit: "1 adult ADD. Suboptimal response to Vyvanse at the 20 mg dose. Increase to 30 mg daily dose.   2.  IBS with both diarrhea and constipation.  Lately has been the significant/severe constipation stage.  Will have her stop Senokot for a while. Will do trial of Amitiza 8 mcg twice a day.   #3 medication side effect--venlafaxine suspected to have been causing fatigue. She is doing a slow wean off of this medication."  INTERIM HX: Amitiza was declined so we ended up going with Linzess. IBS symptoms much better.  Although our increasing her Vyvanse to 30 mg a day has resulted in significant improvement compared to 20 mg daily, she still has some room for improvement. No adverse side effects.  Has had a few months of pain in the lateral aspect of the right hip.  Hard for her to sleep on that side.  No back pain.  No radiating leg pain.  No groin pain.  Additionally, has approximately 8-year history of pain in her left knee that is progressing.  History of an abrasion on that knee and a contusion years ago in an accident.  She has never had this evaluated.  Takes Tylenol occasionally and it helps minimally.  Aches at night.  Hurts the worse going down hills.  Does not notice any significant swelling.  Says it feels deep.  Occasionally it does lock up a bit.   PMP AWARE reviewed today: most recent rx for Vyvanse 30 mg was filled 08/15/2022, # 30, rx by me. No red flags.    Past Medical History:  Diagnosis Date   Attention deficit disorder (ADD) in adult    Chronic renal insufficiency, stage 3 (moderate)    ?d/t dexilant   Diverticulosis 02/19/2018   By colonoscopy 2016    GERD (gastroesophageal reflux disease)    Hx of concussion    Approximately 2016   Hypothyroidism    question of-->no hx of thyroid lab abnormalities in record   IBS (irritable bowel syndrome)     Past Surgical History:  Procedure Laterality Date   CHOLECYSTECTOMY N/A 11/08/2021   Procedure: LAPAROSCOPIC CHOLECYSTECTOMY;  Surgeon: Rusty Aus, DO;  Location: [facility];  Service: General;  Laterality: N/A;   COLONOSCOPY  2016   2016 no polyps. Rpt at [facility] 2022 per pt, normal, no record available   ENDOMETRIAL ABLATION     SHOULDER SURGERY Left    Approximately 2016   TRANSTHORACIC ECHOCARDIOGRAM     01/2021 NORMAL   TUBAL LIGATION     UPPER GASTROINTESTINAL ENDOSCOPY  11/10/2019   neg    Outpatient Medications Prior to Visit  Medication Sig Dispense Refill   loratadine (CLARITIN) 10 MG tablet Take 10 mg by mouth daily.     Multiple Vitamins-Minerals (ADULT GUMMY) CHEW Chew 1 capsule by mouth daily.     VITAMIN D-VITAMIN K PO Take 1 capsule by mouth daily.     lisdexamfetamine (VYVANSE) 30 MG capsule Take 1 capsule (30 mg total) by mouth daily. 30 capsule 0   Sennosides (SENOKOT PO) Take by mouth daily.  2 chewables daily (  morning and night) (Patient not taking: Reported on 09/19/2022)     EPINEPHrine 0.3 mg/0.3 mL IJ SOAJ injection Inject 0.3 mg into the muscle as needed for anaphylaxis. (Patient not taking: Reported on 06/24/2022) 2 each 0   famotidine (PEPCID) 40 MG tablet 1 tab po bid as needed for heartburn/reflux 60 tablet 1   linaclotide (LINZESS) 290 MCG CAPS capsule Take 1 capsule (290 mcg total) by mouth daily before breakfast. 30 capsule 1   venlafaxine XR (EFFEXOR XR) 37.5 MG 24 hr capsule Take 1 capsule (37.5 mg total) by mouth daily with breakfast. 30 capsule 1   Facility-Administered Medications Prior to Visit  Medication Dose Route Frequency Provider Last Rate Last Admin   dextrose 5 % solution   Intravenous Continuous Armbruster, Carlota Raspberry, MD         Allergies  Allergen Reactions   Bee Venom Anaphylaxis   Flagyl [Metronidazole] Anaphylaxis   Iodine Anaphylaxis   Shellfish Allergy Anaphylaxis   Tramadol Hcl Other (See Comments)    Showed signs of stroke   Gluten Meal     Bloating    Lactose Intolerance (Gi) Hives    Bloating,    Melatonin     Nightmares     Review of Systems As per HPI  PE:    09/19/2022    8:26 AM 08/15/2022   11:04 AM 08/15/2022   10:46 AM  Vitals with BMI  Height   5' 11.5"  Weight 170 lbs 6 oz  171 lbs 3 oz  BMI   [id number]  Systolic 0000000 [id number] [id number]  Diastolic 84 83 88  Pulse 85  76    Physical Exam  Gen: Alert, well appearing.  Patient is oriented to person, place, time, and situation. AFFECT: pleasant, lucid thought and speech. Right hip with no tenderness to palpation.  Right hip range of motion fully intact without pain or stiffness. Left knee without erythema, warmth, tenderness, or swelling.  Mild discomfort with patellar grind, no subluxation. Ligamentous testing all stable.  McMurray's negative. Full range of motion, normal strength.  LABS:  Last CBC Lab Results  Component Value Date   WBC 4.7 06/24/2022   HGB 14.1 06/24/2022   HCT 40.9 06/24/2022   MCV 92.5 06/24/2022   MCH 31.9 06/24/2022   RDW 12.2 06/24/2022   PLT 276 [id number]   Last metabolic panel Lab Results  Component Value Date   GLUCOSE 78 06/24/2022   NA 140 06/24/2022   K 4.6 06/24/2022   CL 103 06/24/2022   CO2 30 06/24/2022   BUN 13 06/24/2022   CREATININE 1.18 (H) 06/24/2022   GFRNONAA 53 (L) 10/25/2021   CALCIUM 10.0 06/24/2022   PROT 7.3 06/24/2022   ALBUMIN 4.5 10/25/2021   BILITOT 0.6 06/24/2022   ALKPHOS 83 10/25/2021   AST 30 06/24/2022   ALT 32 (H) 06/24/2022   ANIONGAP 7 10/25/2021   Last lipids Lab Results  Component Value Date   CHOL 264 (H) 06/24/2022   HDL 82 06/24/2022   LDLCALC 163 (H) 06/24/2022   TRIG 87 06/24/2022   CHOLHDL 3.2 06/24/2022   Last hemoglobin A1c Lab Results   Component Value Date   HGBA1C 5.2 10/21/2020   Last thyroid functions Lab Results  Component Value Date   TSH 1.47 06/24/2022   Last vitamin D Lab Results  Component Value Date   VD25OH 84.65 09/13/2021   Last vitamin B12 and Folate Lab Results  Component Value Date   VITAMINB12 1,131 (H) 06/24/2022  IMPRESSION AND PLAN:  #1 chronic left knee pain.  Patellofemoral pain is most likely but intensity/severity of the pain is more severe than I would expect expect from this. (Bedside MSK u/s L knee today unremarkable). Will refer to sports medicine for further evaluation and management.  2.  Right hip pain consistent with trochanteric pain. Stretching/strengthening reviewed.  3.  Adult ADD. Improved but will increase Vyvanse to 40 mg daily dose.  4.  IBS, mixed constipation and diarrhea type. She is better on Linzess 200 mcg tab daily.  An After Visit Summary was printed and given to the patient.  FOLLOW UP: Return for 4-6 wks f/u ADD.  Signed:  Crissie Sickles, MD           09/19/2022

## 2022-09-20 NOTE — Telephone Encounter (Signed)
No further action needed.

## 2022-10-16 DIAGNOSIS — Z419 Encounter for procedure for purposes other than remedying health state, unspecified: Secondary | ICD-10-CM | POA: Diagnosis not present

## 2022-10-21 ENCOUNTER — Other Ambulatory Visit: Payer: Self-pay | Admitting: Family Medicine

## 2022-10-21 ENCOUNTER — Encounter: Payer: Self-pay | Admitting: Family Medicine

## 2022-10-21 MED ORDER — LISDEXAMFETAMINE DIMESYLATE 40 MG PO CAPS: 40.0000 mg | ORAL_CAPSULE | ORAL | 0 refills | Status: DC

## 2022-10-21 NOTE — Telephone Encounter (Signed)
Pt advised medication sent

## 2022-10-21 NOTE — Telephone Encounter (Signed)
Requesting: Vyvanse Contract: 09/18/22 UDS: n/a Last Visit: 09/19/22 Next Visit: 10/31/22 Last Refill: 09/19/22 (30,0)  Please Advise. Med pending

## 2022-10-31 ENCOUNTER — Encounter: Payer: Self-pay | Admitting: Family Medicine

## 2022-10-31 ENCOUNTER — Ambulatory Visit (INDEPENDENT_AMBULATORY_CARE_PROVIDER_SITE_OTHER): Payer: Medicaid Other | Admitting: Family Medicine

## 2022-10-31 VITALS — BP 107/77 | HR 90 | Wt 165.0 lb

## 2022-10-31 DIAGNOSIS — K582 Mixed irritable bowel syndrome: Secondary | ICD-10-CM | POA: Diagnosis not present

## 2022-10-31 DIAGNOSIS — F988 Other specified behavioral and emotional disorders with onset usually occurring in childhood and adolescence: Secondary | ICD-10-CM

## 2022-10-31 MED ORDER — LISDEXAMFETAMINE DIMESYLATE 40 MG PO CAPS: 40.0000 mg | ORAL_CAPSULE | ORAL | 0 refills | Status: DC

## 2022-10-31 NOTE — Progress Notes (Signed)
OFFICE VISIT  10/31/2022  CC:  Chief Complaint  Patient presents with   Follow-up    Patient is a 57 y.o. female who presents for 6-week follow-up adult ADD A/P as of last visit: "1 chronic left knee pain.  Patellofemoral pain is most likely but intensity/severity of the pain is more severe than I would expect expect from this. (Bedside MSK u/s L knee today unremarkable). Will refer to sports medicine for further evaluation and management.   2.  Right hip pain consistent with trochanteric pain. Stretching/strengthening reviewed.   3.  Adult ADD. Improved but will increase Vyvanse to 40 mg daily dose.   4.  IBS, mixed constipation and diarrhea type. She is better on Linzess 200 mcg tab daily."  INTERIM HX: Overall she has been struggling with her constipation again lately.  She went to [place] few weeks ago and that is where it started, feels like she got very dehydrated down there.  She has remained on her Linzess and is currently in the process of rehydrating the best she can, is adding some magnesium complex to her regimen and considering an enema soon.  Pt states all is going well with the med at current dosing--Vyvanse 40 mg every morning.: much improved focus, concentration, task completion.  Less frustration, better multitasking, less impulsivity and restlessness.  Mood is stable. No side effects from the medication.    PMP AWARE reviewed today: most recent rx for Vyvanse 40 mg was filled 10/21/2022, # 30, rx by me. No red flags.   Past Medical History:  Diagnosis Date   Attention deficit disorder (ADD) in adult    Chronic renal insufficiency, stage 3 (moderate) (HCC)    ?d/t dexilant   Diverticulosis 02/19/2018   By colonoscopy 2016   GERD (gastroesophageal reflux disease)    Hx of concussion    Approximately 2016   Hypothyroidism    question of-->no hx of thyroid lab abnormalities in record   IBS (irritable bowel syndrome)     Past Surgical History:   Procedure Laterality Date   CHOLECYSTECTOMY N/A 11/08/2021   Procedure: LAPAROSCOPIC CHOLECYSTECTOMY;  Surgeon: Lewie Chamber, DO;  Location: [facility];  Service: General;  Laterality: N/A;   COLONOSCOPY  2016   2016 no polyps. Rpt at [facility] 2022 per pt, normal, no record available   ENDOMETRIAL ABLATION     SHOULDER SURGERY Left    Approximately 2016   TRANSTHORACIC ECHOCARDIOGRAM     01/2021 NORMAL   TUBAL LIGATION     UPPER GASTROINTESTINAL ENDOSCOPY  11/10/2019   neg    Outpatient Medications Prior to Visit  Medication Sig Dispense Refill   EPINEPHrine 0.3 mg/0.3 mL IJ SOAJ injection Inject 0.3 mg into the muscle as needed for anaphylaxis. 2 each 0   famotidine (PEPCID) 40 MG tablet 1 tab po bid as needed for heartburn/reflux 60 tablet 1   linaclotide (LINZESS) 290 MCG CAPS capsule Take 1 capsule (290 mcg total) by mouth daily before breakfast. 90 capsule 3   loratadine (CLARITIN) 10 MG tablet Take 10 mg by mouth daily.     Multiple Vitamins-Minerals (ADULT GUMMY) CHEW Chew 1 capsule by mouth daily.     venlafaxine XR (EFFEXOR XR) 37.5 MG 24 hr capsule Take 1 capsule (37.5 mg total) by mouth daily with breakfast. 30 capsule 1   VITAMIN D-VITAMIN K PO Take 1 capsule by mouth daily.     lisdexamfetamine (VYVANSE) 40 MG capsule Take 1 capsule (40 mg total) by mouth every  morning. 30 capsule 0   Sennosides (SENOKOT PO) Take by mouth daily.  2 chewables daily (morning and night) (Patient not taking: Reported on 09/19/2022)     Facility-Administered Medications Prior to Visit  Medication Dose Route Frequency Provider Last Rate Last Admin   dextrose 5 % solution   Intravenous Continuous Armbruster, Willaim Rayas, MD        Allergies  Allergen Reactions   Bee Venom Anaphylaxis   Flagyl [Metronidazole] Anaphylaxis   Iodine Anaphylaxis   Shellfish Allergy Anaphylaxis   Tramadol Hcl Other (See Comments)    Showed signs of stroke   Gluten Meal     Bloating    Lactose Intolerance (Gi)  Hives    Bloating,    Melatonin     Nightmares     Review of Systems As per HPI  PE:    10/31/2022    8:44 AM 09/19/2022    8:26 AM 08/15/2022   11:04 AM  Vitals with BMI  Weight 165 lbs 170 lbs 6 oz   Systolic 107 125 664  Diastolic 77 84 83  Pulse 90 85      Physical Exam  Gen: Alert, well appearing.  Patient is oriented to person, place, time, place, situation. AFFECT: pleasant, lucid thought and speech. No further exam today  LABS:  Last metabolic panel Lab Results  Component Value Date   GLUCOSE 78 06/24/2022   NA 140 06/24/2022   K 4.6 06/24/2022   CL 103 06/24/2022   CO2 30 06/24/2022   BUN 13 06/24/2022   CREATININE 1.18 (H) 06/24/2022   GFRNONAA 53 (L) 10/25/2021   CALCIUM 10.0 06/24/2022   PROT 7.3 06/24/2022   ALBUMIN 4.5 10/25/2021   BILITOT 0.6 06/24/2022   ALKPHOS 83 10/25/2021   AST 30 06/24/2022   ALT 32 (H) 06/24/2022   ANIONGAP 7 10/25/2021   IMPRESSION AND PLAN:  #1 adult ADD. Doing well on Vyvanse 40 mg daily. Controlled substance contract up-to-date.  2.  IBS with both diarrhea and constipation.  Currently in a worsened state of constipation. Continue Linzess, continue magnesium complex, discussed use of enema as needed.  An After Visit Summary was printed and given to the patient.  FOLLOW UP: Return in about 3 months (around 01/31/2023) for f/u ADD.  Signed:  Santiago Bumpers, MD           10/31/2022

## 2022-11-03 ENCOUNTER — Encounter: Payer: Self-pay | Admitting: Family Medicine

## 2022-11-04 DIAGNOSIS — R300 Dysuria: Secondary | ICD-10-CM | POA: Diagnosis not present

## 2022-11-04 DIAGNOSIS — R110 Nausea: Secondary | ICD-10-CM | POA: Diagnosis not present

## 2022-11-04 DIAGNOSIS — N390 Urinary tract infection, site not specified: Secondary | ICD-10-CM | POA: Diagnosis not present

## 2022-11-05 NOTE — Telephone Encounter (Signed)
I agree, just stick with antibiotic and flomax.

## 2022-11-05 NOTE — Telephone Encounter (Signed)
No further action needed.

## 2022-11-06 ENCOUNTER — Encounter: Payer: Self-pay | Admitting: Family Medicine

## 2022-11-06 ENCOUNTER — Ambulatory Visit (INDEPENDENT_AMBULATORY_CARE_PROVIDER_SITE_OTHER): Payer: Medicaid Other | Admitting: Family Medicine

## 2022-11-06 VITALS — BP 102/72 | HR 95 | Temp 98.9°F | Ht 71.0 in | Wt 167.1 lb

## 2022-11-06 DIAGNOSIS — R1030 Lower abdominal pain, unspecified: Secondary | ICD-10-CM | POA: Diagnosis not present

## 2022-11-06 DIAGNOSIS — N390 Urinary tract infection, site not specified: Secondary | ICD-10-CM

## 2022-11-06 LAB — POCT URINALYSIS DIPSTICK OB
Blood, UA: NEGATIVE
Spec Grav, UA: 1.015 (ref 1.010–1.025)
Urobilinogen, UA: 2 E.U./dL — AB
pH, UA: 5 (ref 5.0–8.0)

## 2022-11-06 NOTE — Progress Notes (Signed)
Acute Office Visit   Subjective:  Patient ID: Candace Allen, female    DOB: 08-May-1966, 57 y.o.   MRN: [id number]  Chief Complaint  Patient presents with   Urinary Frequency    Pt reports shooting pain in lower abdominal and lower (back rt and left) Pt reports she was seen at Urgent Care. Pt has C/O nausea, fatigued . OTC - AZO      HPI:  Patient is a 57 year old caucasian female that presents with lower abd pain and lower lumbar pain. Pain started in left lower quad and radiated to her left flank. Yesterday, there pain moved across her whole lumbar area. Associated symptoms: nausea with no vomiting, fatigue, increased frequency, hematuria on Sunday.   Denies fever.   Symptoms started last week, but got worse on Sunday.   Patient was seen at [facility] on Monday. Dx with severe UTI and possible kidney stone. Rx.Augmentin 875mg -125mg , Tamsulosin 0.4mg , Pyridium 200mg , and Ondansetron 4mg , Hydrocodone-APAP 5-325mg .. Patient was provided injection in office, but unsure it is an antibiotic or pain medication. She has tried to call back twice, but has been told someone will call her. Yesterday, after taking medication that was prescribed, she felt lethargic. She is concerned it could be from Tamsulosin since she has not had this medication previously.   Review of Systems  Genitourinary:  Positive for frequency.   See HPI above     Objective:   BP 102/72   Pulse 95   Temp 98.9 F (37.2 C)   Ht 5\' 11"  (1.803 m)   Wt 167 lb 2 oz (75.8 kg)   LMP 02/19/2006 (Approximate)   SpO2 95%   BMI 23.31 kg/m    Physical Exam Vitals reviewed.  Constitutional:      General: She is not in acute distress.    Appearance: Normal appearance. She is well-groomed. She is not ill-appearing, toxic-appearing or diaphoretic.     Comments: Patient is able to sit still to have a conversation.   Eyes:     General:        Right eye: No discharge.        Left eye: No discharge.      Conjunctiva/sclera: Conjunctivae normal.  Cardiovascular:     Rate and Rhythm: Normal rate and regular rhythm.     Heart sounds: Normal heart sounds. No murmur heard.    No friction rub. No gallop.  Pulmonary:     Effort: Pulmonary effort is normal. No respiratory distress.     Breath sounds: Normal breath sounds.  Abdominal:     General: Bowel sounds are normal.     Palpations: Abdomen is soft.     Tenderness: There is abdominal tenderness (Mild tenderness left and right lower abd.). There is no right CVA tenderness, left CVA tenderness or guarding.  Musculoskeletal:        General: Normal range of motion.  Skin:    General: Skin is warm and dry.  Neurological:     General: No focal deficit present.     Mental Status: She is alert and oriented to person, place, and time. Mental status is at baseline.  Psychiatric:        Mood and Affect: Mood normal.        Behavior: Behavior normal. Behavior is cooperative.        Thought Content: Thought content normal.        Judgment: Judgment normal.    Results for orders placed  or performed in visit on 11/06/22  POC Urinalysis Dipstick OB  Result Value Ref Range   Color, UA     Clarity, UA     Glucose, UA Small (1+) (A) Negative   Bilirubin, UA 3+    Ketones, UA 2+    Spec Grav, UA 1.015 1.010 - 1.025   Blood, UA neg    pH, UA 5.0 5.0 - 8.0   POC,PROTEIN,UA Small (1+) Negative, Trace, Small (1+), Moderate (2+), Large (3+), 4+   Urobilinogen, UA 2.0 (A) 0.2 or 1.0 E.U./dL   Nitrite, UA +    Leukocytes, UA Large (3+) (A) Negative   Appearance dark (A)    Odor        Assessment & Plan:  Lower abdominal pain -     POC Urinalysis Dipstick OB  Urinary tract infection without hematuria, site unspecified -     Urine Culture  -Urinalysis was positive for glucose, urobilinogen, nitrate, and leukocytes, indicates urinary tract infection. Recommend to continue all the medications that [facility] prescribed. Advised if she is  concerned about the reaction of Flomax, recommend to take medication by itself to see how her body reacts with it.  -Urine sent for culture. Office will call with results and she will see them on MyChart. If antibiotic is changed, it is because the Augmentin will not cover the bacteria found in the culture. -If symptoms become worse through the night, recommend to go to the closes emergency department. -Offered a CT abd/pelvis scan today. Through shared decision making, decided to wait another day to see if the medication helps. Her symptoms are a little improved today. Advised if she is still having severe symptoms tomorrow, please call back to the office and will order CT abd and pelvis scan.   Zandra Abts, NP

## 2022-11-06 NOTE — Patient Instructions (Signed)
-  Urinalysis was positive for glucose, urobilinogen, nitrate, and leukocytes, which means urinary tract infection. Recommend to continue all the medications that [facility]. If you are concerned about the reaction of Flomax, I recommend to take medication by itself to see how your body reacts with it.  -Urine send for culture. Office will call with results and you will see them on MyChart. If antibiotic is changed, it is because the Augmentin will not cover the bacteria found in the culture. -If symptoms become worse through the night, recommend to go to the closes emergency department. -Offered a CT abd/pelvis scan today. Through shared decision making, decided to wait another day to see if the medication helps. If you are still having severe symptoms tomorrow, please call back to the office and will ordered scan. -Hang in there!

## 2022-11-06 NOTE — Telephone Encounter (Signed)
No further action needed.

## 2022-11-07 LAB — URINE CULTURE
MICRO NUMBER:: 14990338
Result:: NO GROWTH
SPECIMEN QUALITY:: ADEQUATE

## 2022-11-08 ENCOUNTER — Telehealth: Payer: Self-pay

## 2022-11-08 NOTE — Telephone Encounter (Signed)
-----   Message from Joanna R Williamson, NP sent at 11/08/2022  8:38 AM EDT ----- There is no growth on urine culture. How are you feeling?  

## 2022-11-08 NOTE — Telephone Encounter (Signed)
-----   Message from Candace Apley, NP sent at 11/08/2022  8:38 AM EDT ----- There is no growth on urine culture. How are you feeling?

## 2022-11-11 ENCOUNTER — Emergency Department (HOSPITAL_BASED_OUTPATIENT_CLINIC_OR_DEPARTMENT_OTHER)
Admission: EM | Admit: 2022-11-11 | Discharge: 2022-11-11 | Disposition: A | Discharge status: Home / Self Care | Payer: Medicaid Other | Attending: Emergency Medicine | Admitting: Emergency Medicine

## 2022-11-11 ENCOUNTER — Encounter: Payer: Self-pay | Admitting: Family Medicine

## 2022-11-11 ENCOUNTER — Other Ambulatory Visit: Payer: Self-pay

## 2022-11-11 DIAGNOSIS — N183 Chronic kidney disease, stage 3 unspecified: Secondary | ICD-10-CM | POA: Insufficient documentation

## 2022-11-11 DIAGNOSIS — T782XXA Anaphylactic shock, unspecified, initial encounter: Secondary | ICD-10-CM | POA: Diagnosis not present

## 2022-11-11 DIAGNOSIS — E039 Hypothyroidism, unspecified: Secondary | ICD-10-CM | POA: Insufficient documentation

## 2022-11-11 DIAGNOSIS — R0602 Shortness of breath: Secondary | ICD-10-CM | POA: Diagnosis not present

## 2022-11-11 DIAGNOSIS — T7840XA Allergy, unspecified, initial encounter: Secondary | ICD-10-CM | POA: Diagnosis present

## 2022-11-11 MED ORDER — EPINEPHRINE 0.3 MG/0.3ML IJ SOAJ: 0.3000 mg | INTRAMUSCULAR | 0 refills | Status: DC | PRN

## 2022-11-11 MED ORDER — PREDNISONE 10 MG (21) PO TBPK: ORAL_TABLET | Freq: Every day | ORAL | 0 refills | Status: DC

## 2022-11-11 MED ORDER — FAMOTIDINE IN NACL 20-0.9 MG/50ML-% IV SOLN
20.0000 mg | Freq: Once | INTRAVENOUS | Status: AC
  Administered 2022-11-11: 20 mg via INTRAVENOUS
  Filled 2022-11-11: qty 50

## 2022-11-11 MED ORDER — DIPHENHYDRAMINE HCL 50 MG/ML IJ SOLN
25.0000 mg | Freq: Once | INTRAMUSCULAR | Status: AC
  Administered 2022-11-11: 25 mg via INTRAVENOUS
  Filled 2022-11-11: qty 1

## 2022-11-11 MED ORDER — METHYLPREDNISOLONE SODIUM SUCC 125 MG IJ SOLR
125.0000 mg | Freq: Once | INTRAMUSCULAR | Status: AC
  Administered 2022-11-11: 125 mg via INTRAVENOUS
  Filled 2022-11-11: qty 2

## 2022-11-11 MED ORDER — EPINEPHRINE 0.3 MG/0.3ML IJ SOAJ
INTRAMUSCULAR | Status: AC
  Filled 2022-11-11: qty 0.3

## 2022-11-11 MED ORDER — EPINEPHRINE 0.3 MG/0.3ML IJ SOAJ
0.3000 mg | Freq: Once | INTRAMUSCULAR | Status: AC
  Administered 2022-11-11: 0.3 mg via INTRAMUSCULAR

## 2022-11-11 NOTE — ED Triage Notes (Signed)
Stung 20 minutes ago. Has Epipen-not used. Complains of SOB.

## 2022-11-11 NOTE — ED Notes (Signed)
Pt discharged to home, NAD noted 

## 2022-11-11 NOTE — ED Provider Notes (Addendum)
[place] EMERGENCY DEPARTMENT AT [facility] Provider Note  CSN: [id number] Arrival date & time: 11/11/22 1750  Chief Complaint(s) Insect Bite  HPI Candace Allen is a 57 y.o. female with past medical history as below, significant for CKD stage III, GERD, IBS, diverticulosis, ADD who presents to the ED with complaint of possible allergic reaction.  Patient reports that she was stung by a wasp or hornet just prior to arrival on her right forearm.  Did have itching and irritation to her right forearm, then felt as though her throat was scratchy and she was having difficulty swallowing.  Difficulty breathing while driving to the hospital.  Reported that she felt like her throat was closing.  Insect bite was approximately 20-30 minutes prior to arrival.  Prior allergic reaction with bee sting in the past but this is much worse than prior reaction  Past Medical History Past Medical History:  Diagnosis Date   Attention deficit disorder (ADD) in adult    Chronic renal insufficiency, stage 3 (moderate) (HCC)    ?d/t dexilant   Diverticulosis 02/19/2018   By colonoscopy 2016   GERD (gastroesophageal reflux disease)    Hx of concussion    Approximately 2016   Hypothyroidism    question of-->no hx of thyroid lab abnormalities in record   IBS (irritable bowel syndrome)    Patient Active Problem List   Diagnosis Date Noted   Calculus of gallbladder without cholecystitis without obstruction    Other fatigue 09/13/2021   TIA (transient ischemic attack) 10/20/2020   Postmenopausal 11/09/2019   Acquired hypothyroidism 11/09/2019   Weight loss 11/09/2019   Hypothyroidism (acquired) 08/20/2019   Visit for preventive health examination 08/20/2019   Encounter for screening mammogram for malignant neoplasm of breast 08/20/2019   History of endometrial cancer 02/19/2018   GERD (gastroesophageal reflux disease) 02/19/2018   IBS (irritable bowel syndrome) 02/19/2018   Diverticulosis  02/19/2018   Home Medication(s) Prior to Admission medications   Medication Sig Start Date End Date Taking? Authorizing Provider  EPINEPHrine 0.3 mg/0.3 mL IJ SOAJ injection Inject 0.3 mg into the muscle as needed for anaphylaxis. 11/11/22  Yes Tanda Rockers A, DO  predniSONE (STERAPRED UNI-PAK 21 TAB) 10 MG (21) TBPK tablet Take by mouth daily. Take 6 tabs by mouth daily  for 2 days, then 5 tabs for 2 days, then 4 tabs for 2 days, then 3 tabs for 2 days, 2 tabs for 2 days, then 1 tab by mouth daily for 2 days 11/11/22  Yes Tanda Rockers A, DO  amoxicillin-clavulanate (AUGMENTIN) 875-125 MG tablet Take 1 tablet by mouth 2 (two) times daily. 11/04/22   [provider]  EPINEPHrine 0.3 mg/0.3 mL IJ SOAJ injection Inject 0.3 mg into the muscle as needed for anaphylaxis. 09/19/22   Jeoffrey Massed, MD  famotidine (PEPCID) 40 MG tablet 1 tab po bid as needed for heartburn/reflux 09/19/22   McGowen, Maryjean Morn, MD  HYDROcodone-acetaminophen (NORCO/VICODIN) 5-325 MG tablet Take 1 tablet by mouth every 4 (four) hours as needed. 11/04/22   [provider]  linaclotide Karlene Einstein) 290 MCG CAPS capsule Take 1 capsule (290 mcg total) by mouth daily before breakfast. 09/19/22   McGowen, Maryjean Morn, MD  lisdexamfetamine (VYVANSE) 40 MG capsule Take 1 capsule (40 mg total) by mouth every morning. 10/31/22   McGowen, Maryjean Morn, MD  loratadine (CLARITIN) 10 MG tablet Take 10 mg by mouth daily.    [provider]  Multiple Vitamins-Minerals (ADULT GUMMY) CHEW Chew 1  capsule by mouth daily.    [provider]  ondansetron (ZOFRAN-ODT) 8 MG disintegrating tablet Take 8 mg by mouth every 8 (eight) hours as needed. 11/04/22   [provider]  phenazopyridine (PYRIDIUM) 200 MG tablet Take 200 mg by mouth 3 (three) times daily. 11/04/22   [provider]  tamsulosin (FLOMAX) 0.4 MG CAPS capsule Take 0.4 mg by mouth daily. 11/04/22   [provider]  venlafaxine XR (EFFEXOR XR)  37.5 MG 24 hr capsule Take 1 capsule (37.5 mg total) by mouth daily with breakfast. 09/19/22   McGowen, Maryjean Morn, MD  VITAMIN D-VITAMIN K PO Take 1 capsule by mouth daily.    [provider]                                                                                                                                    Past Surgical History Past Surgical History:  Procedure Laterality Date   CHOLECYSTECTOMY N/A 11/08/2021   Procedure: LAPAROSCOPIC CHOLECYSTECTOMY;  Surgeon: Lewie Chamber, DO;  Location: [facility];  Service: General;  Laterality: N/A;   COLONOSCOPY  2016   2016 no polyps. Rpt at [facility] 2022 per pt, normal, no record available   ENDOMETRIAL ABLATION     SHOULDER SURGERY Left    Approximately 2016   TRANSTHORACIC ECHOCARDIOGRAM     01/2021 NORMAL   TUBAL LIGATION     UPPER GASTROINTESTINAL ENDOSCOPY  11/10/2019   neg   Family History Family History  Problem Relation Age of Onset   Peripheral Artery Disease Mother    Stroke Mother    Other Father        Hx unknown to patient   Testicular cancer Brother    Esophageal cancer Brother    Early death Brother    Hypotension Maternal Grandmother    Aortic dissection Maternal Grandmother    Heart disease Maternal Grandfather    Diabetes Paternal Grandmother        AODM   Diabetes Paternal Grandfather        AODM   Healthy Daughter    Healthy Son    Ovarian cancer Paternal Great-grandmother     Social History Social History   Tobacco Use   Smoking status: Never    Passive exposure: Never   Smokeless tobacco: Never  Vaping Use   Vaping Use: Never used  Substance Use Topics   Alcohol use: Yes    Alcohol/week: 2.0 standard drinks of alcohol    Types: 2 Standard drinks or equivalent per week    Comment: social   Drug use: Never   Allergies Bee venom, Flagyl [metronidazole], Iodine, Shellfish allergy, Tramadol hcl, Gluten meal, Lactose intolerance (gi), and Melatonin  Review of Systems Review of  Systems  Constitutional:  Negative for activity change and fever.  HENT:  Positive for sore throat and trouble swallowing. Negative for facial swelling.   Eyes:  Negative for  discharge and redness.  Respiratory:  Positive for shortness of breath. Negative for cough.   Cardiovascular:  Negative for chest pain and palpitations.  Gastrointestinal:  Negative for abdominal pain and nausea.  Genitourinary:  Negative for dysuria and flank pain.  Musculoskeletal:  Negative for back pain and gait problem.  Skin:  Positive for rash. Negative for pallor.  Neurological:  Negative for syncope and headaches.    Physical Exam Vital Signs  I have reviewed the triage vital signs BP (!) 145/74   Pulse 80   Temp 98.4 F (36.9 C) (Oral)   Resp 14   LMP 02/19/2006 (Approximate)   SpO2 95%  Physical Exam Vitals and nursing note reviewed.  Constitutional:      General: She is not in acute distress.    Appearance: Normal appearance.  HENT:     Head: Normocephalic and atraumatic.     Right Ear: External ear normal.     Left Ear: External ear normal.     Nose: Nose normal.     Mouth/Throat:     Mouth: Mucous membranes are moist.  Eyes:     General: No scleral icterus.       Right eye: No discharge.        Left eye: No discharge.  Neck:     Trachea: Trachea normal. No tracheal deviation.     Comments: No drooling trismus or stridor Cardiovascular:     Rate and Rhythm: Normal rate and regular rhythm.     Pulses: Normal pulses.     Heart sounds: Normal heart sounds.  Pulmonary:     Effort: Pulmonary effort is normal. No respiratory distress.     Breath sounds: Normal breath sounds.  Abdominal:     General: Abdomen is flat.     Tenderness: There is no abdominal tenderness.  Musculoskeletal:        General: Normal range of motion.     Cervical back: No rigidity.     Right lower leg: No edema.     Left lower leg: No edema.  Skin:    General: Skin is warm and dry.     Capillary Refill:  Capillary refill takes less than 2 seconds.       Neurological:     Mental Status: She is alert.  Psychiatric:        Mood and Affect: Mood normal.        Behavior: Behavior normal.     ED Results and Treatments Labs (all labs ordered are listed, but only abnormal results are displayed) Labs Reviewed - No data to display                                                                                                                        Radiology No results found.  Pertinent labs & imaging results that were available during my care of the patient were reviewed by me and considered in my medical decision making (see MDM for details).  Medications Ordered in ED Medications  diphenhydrAMINE (BENADRYL) injection 25 mg (25 mg Intravenous Given 11/11/22 1808)  EPINEPHrine (EPI-PEN) injection 0.3 mg (0.3 mg Intramuscular Given 11/11/22 1802)  methylPREDNISolone sodium succinate (SOLU-MEDROL) 125 mg/2 mL injection 125 mg (125 mg Intravenous Given 11/11/22 1808)  famotidine (PEPCID) IVPB 20 mg premix (20 mg Intravenous New Bag/Given 11/11/22 1812)                                                                                                                                     Procedures .Critical Care  Performed by: Sloan Leiter, DO Authorized by: Sloan Leiter, DO   Critical care provider statement:    Critical care time (minutes):  40   Critical care time was exclusive of:  Separately billable procedures and treating other patients   Critical care was necessary to treat or prevent imminent or life-threatening deterioration of the following conditions: anaphylaxis.   Critical care was time spent personally by me on the following activities:  Development of treatment plan with patient or surrogate, discussions with consultants, evaluation of patient's response to treatment, examination of patient, ordering and review of laboratory studies, ordering and review of radiographic studies,  ordering and performing treatments and interventions, pulse oximetry, re-evaluation of patient's condition, review of old charts and obtaining history from patient or surrogate   (including critical care time)  Medical Decision Making / ED Course    Medical Decision Making:    Candace Allen is a 57 y.o. female with past medical history as below, significant for CKD stage III, GERD, IBS, diverticulosis, ADD who presents to the ED with complaint of possible allergic reaction.. The complaint involves an extensive differential diagnosis and also carries with it a high risk of complications and morbidity.  Serious etiology was considered. Ddx includes but is not limited to: Anaphylaxis, localized allergic reaction, anxiety, cellulitis, etc.  Complete initial physical exam performed, notably the patient  was no overt facial swelling, there is some localized swelling to her right forearm.  No stridor..    Reviewed and confirmed nursing documentation for past medical history, family history, social history.  Vital signs reviewed.    Clinical Course as of 11/11/22 1937  Mon Nov 11, 2022  1931 Symptoms greatly improved, breathing comfortably, no dysphagia or dysphonia. Feeling better overall  [SG]    Clinical Course User Index [SG] Sloan Leiter, DO    Patient with recent bug bite/sting.  Complaining of dysphagia, dyspnea, rash to forearm.  Concern for possibly developing anaphylaxis.  Given anaphylaxis cocktail including EpiPen.  Plan observation.  Symptoms greatly improved following interventions, she is speaking at her typical level, no dysphagia.  Irritation to her skin has improved.  No obvious hives.  Patient reports that she followed with allergist previously but has not seen them in greater than 10 years.  Will discharge home with EpiPen, antihistamine, short course of steroids, advised  follow-up with PCP and with allergist.  Advised to avoid allergens in the future  The patient  improved significantly and was discharged in stable condition. Detailed discussions were had with the patient regarding current findings, and need for close f/u with PCP or on call doctor. The patient has been instructed to return immediately if the symptoms worsen in any way for re-evaluation. Patient verbalized understanding and is in agreement with current care plan. All questions answered prior to discharge.             Additional history obtained: -Additional history obtained from spouse -External records from outside source obtained and reviewed including: Chart review including previous notes, labs, imaging, consultation notes including primary care recommendation, allergy list, home medications   Lab Tests: na  EKG   EKG Interpretation  Date/Time:    Ventricular Rate:    PR Interval:    QRS Duration:   QT Interval:    QTC Calculation:   R Axis:     Text Interpretation:           Imaging Studies ordered: na   Medicines ordered and prescription drug management: Meds ordered this encounter  Medications   diphenhydrAMINE (BENADRYL) injection 25 mg   EPINEPHrine (EPI-PEN) injection 0.3 mg   methylPREDNISolone sodium succinate (SOLU-MEDROL) 125 mg/2 mL injection 125 mg    IV methylprednisolone will be converted to either a q12h or q24h frequency with the same total daily dose (TDD).  Ordered Dose: 1 to 125 mg TDD; convert to: TDD q24h.  Ordered Dose: 126 to 250 mg TDD; convert to: TDD div q12h.  Ordered Dose: >250 mg TDD; DAW.   famotidine (PEPCID) IVPB 20 mg premix   EPINEPHrine (EPI-PEN) 0.3 mg/0.3 mL injection    Lozer, Connor: cabinet override   EPINEPHrine 0.3 mg/0.3 mL IJ SOAJ injection    Sig: Inject 0.3 mg into the muscle as needed for anaphylaxis.    Dispense:  1 each    Refill:  0   predniSONE (STERAPRED UNI-PAK 21 TAB) 10 MG (21) TBPK tablet    Sig: Take by mouth daily. Take 6 tabs by mouth daily  for 2 days, then 5 tabs for 2 days, then 4 tabs  for 2 days, then 3 tabs for 2 days, 2 tabs for 2 days, then 1 tab by mouth daily for 2 days    Dispense:  42 tablet    Refill:  0    -I have reviewed the patients home medicines and have made adjustments as needed   Consultations Obtained: na   Cardiac Monitoring: The patient was maintained on a cardiac monitor.  I personally viewed and interpreted the cardiac monitored which showed an underlying rhythm of: NSR  Social Determinants of Health:  Diagnosis or treatment significantly limited by social determinants of health: non smoker   Reevaluation: After the interventions noted above, I reevaluated the patient and found that they have resolved  Co morbidities that complicate the patient evaluation  Past Medical History:  Diagnosis Date   Attention deficit disorder (ADD) in adult    Chronic renal insufficiency, stage 3 (moderate) (HCC)    ?d/t dexilant   Diverticulosis 02/19/2018   By colonoscopy 2016   GERD (gastroesophageal reflux disease)    Hx of concussion    Approximately 2016   Hypothyroidism    question of-->no hx of thyroid lab abnormalities in record   IBS (irritable bowel syndrome)       Dispostion: Disposition decision including need for hospitalization  was considered, and patient discharged from emergency department.    Final Clinical Impression(s) / ED Diagnoses Final diagnoses:  Anaphylaxis, initial encounter     This chart was dictated using voice recognition software.  Despite best efforts to proofread,  errors can occur which can change the documentation meaning.    Sloan Leiter, DO 11/11/22 1937    Sloan Leiter, DO 11/11/22 2128

## 2022-11-11 NOTE — Discharge Instructions (Addendum)
Please continue taking your Claritin daily, please follow up with allergy clinic  It was a pleasure caring for you today in the emergency department.  Please return to the emergency department for any worsening or worrisome symptoms.

## 2022-11-11 NOTE — ED Provider Triage Note (Signed)
Emergency Medicine Provider Triage Evaluation Note  Candace Allen , a 57 y.o. female  was evaluated in triage.  Pt complains of shortness of breath after a bee sting occurring about 20 minutes ago.  Patient was stung on the right wrist.  She started having shortness of breath during the drive to the hospital.  No lip or tongue swelling.  She feels like her throat is closing.  No lightheadedness or syncope.  No vomiting or diarrhea.  She has had some localized swelling of her right wrist.  Review of Systems  Positive: Wrist swelling, shortness of breath Negative: Syncope  Physical Exam  LMP 02/19/2006 (Approximate)  Gen:   Awake, no distress   Resp:  Normal effort  MSK:   Moves extremities without difficulty  Other:  Patient speaking in whispers, lungs are clear to auscultation bilaterally  Medical Decision Making  Medically screening exam initiated at 6:01 PM.  Appropriate orders placed.  Candace Allen was informed that the remainder of the evaluation will be completed by another provider, this initial triage assessment does not replace that evaluation, and the importance of remaining in the ED until their evaluation is complete.  Epinephrine and anaphylaxis meds ordered.  Patient currently stable.   Renne Crigler, PA-C 11/11/22 1610

## 2022-11-12 ENCOUNTER — Encounter: Payer: Self-pay | Admitting: Family Medicine

## 2022-11-16 ENCOUNTER — Other Ambulatory Visit: Payer: Self-pay | Admitting: Family Medicine

## 2022-11-16 DIAGNOSIS — Z419 Encounter for procedure for purposes other than remedying health state, unspecified: Secondary | ICD-10-CM | POA: Diagnosis not present

## 2022-11-18 ENCOUNTER — Other Ambulatory Visit: Payer: Self-pay

## 2022-11-18 MED ORDER — FAMOTIDINE 40 MG PO TABS: ORAL_TABLET | ORAL | 1 refills | Status: DC

## 2022-11-18 NOTE — Telephone Encounter (Signed)
RF request for Famotidine LOV: 10/31/22 Next ov: 12/31/22 Last written: 09/19/22 (60,1)

## 2022-12-16 DIAGNOSIS — Z419 Encounter for procedure for purposes other than remedying health state, unspecified: Secondary | ICD-10-CM | POA: Diagnosis not present

## 2022-12-23 ENCOUNTER — Other Ambulatory Visit: Payer: Self-pay | Admitting: Family Medicine

## 2022-12-23 ENCOUNTER — Encounter: Payer: Self-pay | Admitting: Family Medicine

## 2022-12-23 MED ORDER — LISDEXAMFETAMINE DIMESYLATE 40 MG PO CAPS: 40.0000 mg | ORAL_CAPSULE | ORAL | 0 refills | Status: DC

## 2022-12-23 MED ORDER — VENLAFAXINE HCL ER 37.5 MG PO CP24: 37.5000 mg | ORAL_CAPSULE | Freq: Every day | ORAL | 1 refills | Status: DC

## 2022-12-23 NOTE — Telephone Encounter (Signed)
Prescription Request  12/23/2022  LOV: 10/31/2022  What is the name of the medication or equipment? venlafaxine XR (EFFEXOR XR) 37.5 MG 24 hr capsule , lisdexamfetamine (VYVANSE) 40 MG capsule   Have you contacted your pharmacy to request a refill? No   Which pharmacy would you like this sent to?  [id number] - [place],  - [address] [place] [address] Phone: [phone] Fax: [phone]    Patient notified that their request is being sent to the clinical staff for review and that they should receive a response within 2 business days.   Please advise at Mobile [phone] (mobile)

## 2022-12-23 NOTE — Telephone Encounter (Signed)
RF request for venlafaxine LOV: 10/31/22 Next ov: advised to f/u 3 mo Last written: 09/19/22 (30,1)  Requesting: vyvanse Contract: 09/18/22, not signed UDS: N/A Last Visit: 10/31/22 Next Visit: advised to f/u 3 mo Last Refill: 10/31/22 (30,0)  Please Advise. Meds pending

## 2022-12-23 NOTE — Telephone Encounter (Signed)
Please refer to phone note sent this morning, 7/8. Closing this encounter for duplicate request

## 2022-12-25 ENCOUNTER — Other Ambulatory Visit: Payer: Self-pay | Admitting: Family Medicine

## 2022-12-25 NOTE — Telephone Encounter (Signed)
RF request for linaclotide (LINZESS) 290 MCG CAPS capsule  LOV: 10/31/22 Next ov: 12/31/22 Last written: 09/19/22 (90,3) [facility] [place]  Refill requested too early

## 2022-12-31 ENCOUNTER — Ambulatory Visit: Payer: Medicaid Other | Admitting: Family Medicine

## 2022-12-31 ENCOUNTER — Encounter: Payer: Self-pay | Admitting: Family Medicine

## 2022-12-31 VITALS — BP 137/88 | HR 90 | Temp 98.1°F | Wt 167.4 lb

## 2022-12-31 DIAGNOSIS — F988 Other specified behavioral and emotional disorders with onset usually occurring in childhood and adolescence: Secondary | ICD-10-CM

## 2022-12-31 DIAGNOSIS — F411 Generalized anxiety disorder: Secondary | ICD-10-CM | POA: Diagnosis not present

## 2022-12-31 DIAGNOSIS — N3000 Acute cystitis without hematuria: Secondary | ICD-10-CM

## 2022-12-31 DIAGNOSIS — K582 Mixed irritable bowel syndrome: Secondary | ICD-10-CM | POA: Diagnosis not present

## 2022-12-31 DIAGNOSIS — R3915 Urgency of urination: Secondary | ICD-10-CM | POA: Diagnosis not present

## 2022-12-31 LAB — POCT URINALYSIS DIPSTICK
Bilirubin, UA: NEGATIVE
Blood, UA: NEGATIVE
Glucose, UA: NEGATIVE
Ketones, UA: NEGATIVE
Leukocytes, UA: NEGATIVE
Nitrite, UA: NEGATIVE
Protein, UA: NEGATIVE
Spec Grav, UA: <=1.005 — AB (ref 1.010–1.025)
Urobilinogen, UA: 0.2 E.U./dL
pH, UA: 7 (ref 5.0–8.0)

## 2022-12-31 MED ORDER — LISDEXAMFETAMINE DIMESYLATE 40 MG PO CAPS: 40.0000 mg | ORAL_CAPSULE | ORAL | 0 refills | Status: DC

## 2022-12-31 MED ORDER — CEPHALEXIN 500 MG PO CAPS
ORAL_CAPSULE | ORAL | 0 refills | Status: DC
Start: 2022-12-31 — End: 2023-01-20

## 2022-12-31 NOTE — Progress Notes (Signed)
OFFICE VISIT  12/31/2022  CC:  Chief Complaint  Patient presents with   Follow-up    2 month follow up on ADD. She thinks she may have a Uti. Symptoms consist of urgency.    Patient is a 57 y.o. female who presents for 67-month follow-up adult ADD, anxiety, also having acute urinary concerns. A/P as of last visit: "1 adult ADD. Doing well on Vyvanse 40 mg daily. Controlled substance contract up-to-date.   2.  IBS with both diarrhea and constipation.  Currently in a worsened state of constipation. Continue Linzess, continue magnesium complex, discussed use of enema as needed."  INTERIM HX: Onset last night L suprapubic pain. No dysuria.  Some urgency.  No odor or abnl appearance.  Linzess not helping as much, goes days w/out BM.  Bloated and lethargic feeling, dec appetite. Stool hard, little pebbles.  Alt with diarrhea.  Pt states all is going well with the med at current dosing (vyvanse 40mg ): much improved focus, concentration, task completion.  Less frustration, better multitasking, less impulsivity and restlessness.  Mood is stable. Anxiety level stable. No side effects from the medication.  ROS as above, plus--> no fevers, no CP, no SOB, no wheezing, no cough, no dizziness, no HAs, no rashes, no melena/hematochezia.  No polyuria or polydipsia.  No myalgias or arthralgias.  No focal weakness, paresthesias, or tremors.  No acute vision or hearing abnormalities.  No recent changes in lower legs. No nausea or vomiting , no palpitations.     Past Medical History:  Diagnosis Date   Attention deficit disorder (ADD) in adult    Chronic renal insufficiency, stage 3 (moderate) (HCC)    ?d/t dexilant   Diverticulosis 02/19/2018   By colonoscopy 2016   GERD (gastroesophageal reflux disease)    Hx of concussion    Approximately 2016   Hypothyroidism    question of-->no hx of thyroid lab abnormalities in record   IBS (irritable bowel syndrome)     Past Surgical History:   Procedure Laterality Date   CHOLECYSTECTOMY N/A 11/08/2021   Procedure: LAPAROSCOPIC CHOLECYSTECTOMY;  Surgeon: Lewie Chamber, DO;  Location: [facility];  Service: General;  Laterality: N/A;   COLONOSCOPY  2016   2016 no polyps. Rpt at [facility] 2022 per pt, normal, no record available   ENDOMETRIAL ABLATION     SHOULDER SURGERY Left    Approximately 2016   TRANSTHORACIC ECHOCARDIOGRAM     01/2021 NORMAL   TUBAL LIGATION     UPPER GASTROINTESTINAL ENDOSCOPY  11/10/2019   neg    Outpatient Medications Prior to Visit  Medication Sig Dispense Refill   EPINEPHrine 0.3 mg/0.3 mL IJ SOAJ injection Inject 0.3 mg into the muscle as needed for anaphylaxis. 1 each 0   famotidine (PEPCID) 40 MG tablet 1 tab po bid as needed for heartburn/reflux 60 tablet 1   linaclotide (LINZESS) 290 MCG CAPS capsule Take 1 capsule (290 mcg total) by mouth daily before breakfast. 90 capsule 3   loratadine (CLARITIN) 10 MG tablet Take 10 mg by mouth daily.     Multiple Vitamins-Minerals (ADULT GUMMY) CHEW Chew 1 capsule by mouth daily.     venlafaxine XR (EFFEXOR XR) 37.5 MG 24 hr capsule Take 1 capsule (37.5 mg total) by mouth daily with breakfast. 90 capsule 1   VITAMIN D-VITAMIN K PO Take 1 capsule by mouth daily.     lisdexamfetamine (VYVANSE) 40 MG capsule Take 1 capsule (40 mg total) by mouth every morning. 30 capsule 0  ondansetron (ZOFRAN-ODT) 8 MG disintegrating tablet Take 8 mg by mouth every 8 (eight) hours as needed. (Patient not taking: Reported on 12/31/2022)     phenazopyridine (PYRIDIUM) 200 MG tablet Take 200 mg by mouth 3 (three) times daily. (Patient not taking: Reported on 12/31/2022)     amoxicillin-clavulanate (AUGMENTIN) 875-125 MG tablet Take 1 tablet by mouth 2 (two) times daily.     EPINEPHrine 0.3 mg/0.3 mL IJ SOAJ injection Inject 0.3 mg into the muscle as needed for anaphylaxis. 2 each 0   HYDROcodone-acetaminophen (NORCO/VICODIN) 5-325 MG tablet Take 1 tablet by mouth every 4 (four)  hours as needed. (Patient not taking: Reported on 12/31/2022)     predniSONE (STERAPRED UNI-PAK 21 TAB) 10 MG (21) TBPK tablet Take by mouth daily. Take 6 tabs by mouth daily  for 2 days, then 5 tabs for 2 days, then 4 tabs for 2 days, then 3 tabs for 2 days, 2 tabs for 2 days, then 1 tab by mouth daily for 2 days 42 tablet 0   tamsulosin (FLOMAX) 0.4 MG CAPS capsule Take 0.4 mg by mouth daily.     Facility-Administered Medications Prior to Visit  Medication Dose Route Frequency Provider Last Rate Last Admin   dextrose 5 % solution   Intravenous Continuous Armbruster, Willaim Rayas, MD        Allergies  Allergen Reactions   Bee Venom Anaphylaxis   Flagyl [Metronidazole] Anaphylaxis   Iodine Anaphylaxis   Shellfish Allergy Anaphylaxis   Tramadol Hcl Other (See Comments)    Showed signs of stroke   Wasp Venom Anaphylaxis   Gluten Meal     Bloating    Lactose Intolerance (Gi) Hives    Bloating,    Melatonin     Nightmares     Review of Systems As per HPI  PE:    12/31/2022    8:20 AM 11/11/2022    7:10 PM 11/11/2022    7:02 PM  Vitals with BMI  Weight 167 lbs 6 oz    Systolic 137 128   Diastolic 88 76   Pulse 90 84 80     Physical Exam  Gen: Alert, well appearing.  Patient is oriented to person, place, time, and situation. AFFECT: pleasant, lucid thought and speech. No further exam today  LABS:  Last CBC Lab Results  Component Value Date   WBC 4.7 06/24/2022   HGB 14.1 06/24/2022   HCT 40.9 06/24/2022   MCV 92.5 06/24/2022   MCH 31.9 06/24/2022   RDW 12.2 06/24/2022   PLT 276 06/24/2022   Last metabolic panel Lab Results  Component Value Date   GLUCOSE 78 06/24/2022   NA 140 06/24/2022   K 4.6 06/24/2022   CL 103 06/24/2022   CO2 30 06/24/2022   BUN 13 06/24/2022   CREATININE 1.18 (H) 06/24/2022   GFRNONAA 53 (L) 10/25/2021   CALCIUM 10.0 06/24/2022   PROT 7.3 06/24/2022   ALBUMIN 4.5 10/25/2021   BILITOT 0.6 06/24/2022   ALKPHOS 83 10/25/2021   AST 30  06/24/2022   ALT 32 (H) 06/24/2022   ANIONGAP 7 10/25/2021   Last lipids Lab Results  Component Value Date   CHOL 264 (H) 06/24/2022   HDL 82 06/24/2022   LDLCALC 163 (H) 06/24/2022   TRIG 87 06/24/2022   CHOLHDL 3.2 06/24/2022   Last thyroid functions Lab Results  Component Value Date   TSH 1.47 06/24/2022    IMPRESSION AND PLAN:  #1 adult ADD. Doing well on  Vyvanse 40 mg a day. Controlled substance contract up-to-date. Will do urine drug screen at next follow-up in 3 months.  #2 GAD. Doing well on Effexor XR 37.5 mg a day.  #3 IBS, alternating diarrhea and constipation. She gets some improvement with Linzess 290 mcg daily but still has problems. Multiple constipation medications in the past have not been effective.  Trouble with hydrating adequately lately due to all the heat and sweating. She will try to improve hydration and use a combination of over-the-counter meds to add to her Linzess periodically.  4.  Left suprapubic pain, some urinary urgency recently. These symptoms are often indicative of UTI and her. Point-of-care UA today: NORMAL. Urine culture sent. Start Keflex 500 mg 3 times daily x 3 days.  An After Visit Summary was printed and given to the patient.  FOLLOW UP: Return in about 3 months (around 04/02/2023) for routine chronic illness f/u. Next cpe 06/2023 Signed:  Santiago Bumpers, MD           12/31/2022

## 2023-01-01 LAB — URINE CULTURE
MICRO NUMBER:: 15206484
SPECIMEN QUALITY:: ADEQUATE

## 2023-01-16 DIAGNOSIS — Z419 Encounter for procedure for purposes other than remedying health state, unspecified: Secondary | ICD-10-CM | POA: Diagnosis not present

## 2023-01-20 ENCOUNTER — Other Ambulatory Visit: Payer: Self-pay | Admitting: Family Medicine

## 2023-01-20 ENCOUNTER — Other Ambulatory Visit: Payer: Self-pay

## 2023-01-20 MED ORDER — FAMOTIDINE 40 MG PO TABS: ORAL_TABLET | ORAL | 1 refills | Status: DC

## 2023-01-20 NOTE — Telephone Encounter (Signed)
RF request for famotidine (PEPCID) 40 MG tablet  LOV: 12/31/22 Next ov: 1016/24 Last written: 11/18/22 (60,1)  Refill sent until next appt

## 2023-01-24 ENCOUNTER — Other Ambulatory Visit: Payer: Self-pay | Admitting: Family Medicine

## 2023-01-24 MED ORDER — LISDEXAMFETAMINE DIMESYLATE 40 MG PO CAPS: 40.0000 mg | ORAL_CAPSULE | ORAL | 0 refills | Status: DC

## 2023-01-24 NOTE — Telephone Encounter (Signed)
Requesting: vyvanse Contract: N/A UDS: N/A Last Visit: 12/31/22 Next Visit: 04/02/23 Last Refill: 12/31/22 (30,0)  Please Advise. Med pending

## 2023-01-29 DIAGNOSIS — M9901 Segmental and somatic dysfunction of cervical region: Secondary | ICD-10-CM | POA: Diagnosis not present

## 2023-01-29 DIAGNOSIS — M9902 Segmental and somatic dysfunction of thoracic region: Secondary | ICD-10-CM | POA: Diagnosis not present

## 2023-01-29 DIAGNOSIS — M5382 Other specified dorsopathies, cervical region: Secondary | ICD-10-CM | POA: Diagnosis not present

## 2023-01-30 DIAGNOSIS — M9902 Segmental and somatic dysfunction of thoracic region: Secondary | ICD-10-CM | POA: Diagnosis not present

## 2023-01-30 DIAGNOSIS — M5382 Other specified dorsopathies, cervical region: Secondary | ICD-10-CM | POA: Diagnosis not present

## 2023-01-30 DIAGNOSIS — M9901 Segmental and somatic dysfunction of cervical region: Secondary | ICD-10-CM | POA: Diagnosis not present

## 2023-02-03 DIAGNOSIS — M5382 Other specified dorsopathies, cervical region: Secondary | ICD-10-CM | POA: Diagnosis not present

## 2023-02-03 DIAGNOSIS — M9902 Segmental and somatic dysfunction of thoracic region: Secondary | ICD-10-CM | POA: Diagnosis not present

## 2023-02-03 DIAGNOSIS — M9901 Segmental and somatic dysfunction of cervical region: Secondary | ICD-10-CM | POA: Diagnosis not present

## 2023-02-06 DIAGNOSIS — M9901 Segmental and somatic dysfunction of cervical region: Secondary | ICD-10-CM | POA: Diagnosis not present

## 2023-02-06 DIAGNOSIS — M5382 Other specified dorsopathies, cervical region: Secondary | ICD-10-CM | POA: Diagnosis not present

## 2023-02-06 DIAGNOSIS — M9902 Segmental and somatic dysfunction of thoracic region: Secondary | ICD-10-CM | POA: Diagnosis not present

## 2023-02-07 ENCOUNTER — Other Ambulatory Visit: Payer: Self-pay | Admitting: Family Medicine

## 2023-02-07 ENCOUNTER — Encounter: Payer: Self-pay | Admitting: Family Medicine

## 2023-02-10 DIAGNOSIS — M9901 Segmental and somatic dysfunction of cervical region: Secondary | ICD-10-CM | POA: Diagnosis not present

## 2023-02-10 DIAGNOSIS — M9902 Segmental and somatic dysfunction of thoracic region: Secondary | ICD-10-CM | POA: Diagnosis not present

## 2023-02-10 DIAGNOSIS — M5382 Other specified dorsopathies, cervical region: Secondary | ICD-10-CM | POA: Diagnosis not present

## 2023-02-13 DIAGNOSIS — M5382 Other specified dorsopathies, cervical region: Secondary | ICD-10-CM | POA: Diagnosis not present

## 2023-02-13 DIAGNOSIS — M9902 Segmental and somatic dysfunction of thoracic region: Secondary | ICD-10-CM | POA: Diagnosis not present

## 2023-02-13 DIAGNOSIS — M9901 Segmental and somatic dysfunction of cervical region: Secondary | ICD-10-CM | POA: Diagnosis not present

## 2023-02-16 DIAGNOSIS — Z419 Encounter for procedure for purposes other than remedying health state, unspecified: Secondary | ICD-10-CM | POA: Diagnosis not present

## 2023-02-18 DIAGNOSIS — M5382 Other specified dorsopathies, cervical region: Secondary | ICD-10-CM | POA: Diagnosis not present

## 2023-02-18 DIAGNOSIS — M9901 Segmental and somatic dysfunction of cervical region: Secondary | ICD-10-CM | POA: Diagnosis not present

## 2023-02-18 DIAGNOSIS — M9902 Segmental and somatic dysfunction of thoracic region: Secondary | ICD-10-CM | POA: Diagnosis not present

## 2023-02-24 ENCOUNTER — Encounter: Payer: Self-pay | Admitting: Family Medicine

## 2023-02-24 DIAGNOSIS — M9901 Segmental and somatic dysfunction of cervical region: Secondary | ICD-10-CM | POA: Diagnosis not present

## 2023-02-24 DIAGNOSIS — M5382 Other specified dorsopathies, cervical region: Secondary | ICD-10-CM | POA: Diagnosis not present

## 2023-02-24 DIAGNOSIS — M9902 Segmental and somatic dysfunction of thoracic region: Secondary | ICD-10-CM | POA: Diagnosis not present

## 2023-02-25 MED ORDER — LISDEXAMFETAMINE DIMESYLATE 40 MG PO CAPS: 40.0000 mg | ORAL_CAPSULE | ORAL | 0 refills | Status: DC

## 2023-02-25 NOTE — Telephone Encounter (Signed)
OK, vyvanse sent

## 2023-02-25 NOTE — Telephone Encounter (Signed)
Refill requested for Vyvanse. Pt states she took the last pill today. Next OV 10/16

## 2023-03-05 ENCOUNTER — Inpatient Hospital Stay (HOSPITAL_COMMUNITY): Admission: RE | Admit: 2023-03-05 | Payer: Medicaid Other | Source: Ambulatory Visit

## 2023-03-05 ENCOUNTER — Ambulatory Visit (INDEPENDENT_AMBULATORY_CARE_PROVIDER_SITE_OTHER): Payer: Medicaid Other | Admitting: Family Medicine

## 2023-03-05 ENCOUNTER — Encounter: Payer: Self-pay | Admitting: Family Medicine

## 2023-03-05 VITALS — BP 116/80 | HR 91 | Temp 98.0°F | Wt 160.8 lb

## 2023-03-05 DIAGNOSIS — M79644 Pain in right finger(s): Secondary | ICD-10-CM | POA: Diagnosis not present

## 2023-03-05 DIAGNOSIS — Z1231 Encounter for screening mammogram for malignant neoplasm of breast: Secondary | ICD-10-CM

## 2023-03-05 NOTE — Progress Notes (Signed)
OFFICE VISIT  03/05/2023  CC:  Chief Complaint  Patient presents with   Hand Pain    Pain in right thumb for a few months.    Patient is a 57 y.o. female who presents for right thumb pain.  HPI: Describes a month or 2 of intermittently feeling a sharp pain over the thenar eminence of the right hand. Seems to last minutes to hours and then spontaneously resolve.  Happening more frequently the last week or 2.  The thumb does not seem stiff in the joints and she is not have any triggering sensation. She has gotten over-the-counter splint that she describes like a thumb spica and feels like it has helped some. As far as repetitive use she does use her phone to text a lot. She has some neck stiffness but no radicular shoulder or arms pain. The other day she did feel a significant sense of tingling in the right hand but also felt similar in the left at that time.  It spontaneously resolved and has not recurred.   Past Medical History:  Diagnosis Date   Attention deficit disorder (ADD) in adult    Chronic renal insufficiency, stage 3 (moderate) (HCC)    ?d/t dexilant   Diverticulosis 02/19/2018   By colonoscopy 2016   GERD (gastroesophageal reflux disease)    Hx of concussion    Approximately 2016   Hypothyroidism    question of-->no hx of thyroid lab abnormalities in record   IBS (irritable bowel syndrome)     Past Surgical History:  Procedure Laterality Date   CHOLECYSTECTOMY N/A 11/08/2021   Procedure: LAPAROSCOPIC CHOLECYSTECTOMY;  Surgeon: Lewie Chamber, DO;  Location: [facility];  Service: General;  Laterality: N/A;   COLONOSCOPY  2016   2016 no polyps. Rpt at [facility] 2022 per pt, normal, no record available   ENDOMETRIAL ABLATION     SHOULDER SURGERY Left    Approximately 2016   TRANSTHORACIC ECHOCARDIOGRAM     01/2021 NORMAL   TUBAL LIGATION     UPPER GASTROINTESTINAL ENDOSCOPY  11/10/2019   neg    Outpatient Medications Prior to Visit  Medication Sig Dispense  Refill   EPINEPHrine 0.3 mg/0.3 mL IJ SOAJ injection Inject 0.3 mg into the muscle as needed for anaphylaxis. 1 each 0   famotidine (PEPCID) 40 MG tablet 1 tab po bid as needed for heartburn/reflux 60 tablet 1   linaclotide (LINZESS) 290 MCG CAPS capsule TAKE 1 CAPSULE(290 MCG) BY MOUTH DAILY BEFORE BREAKFAST 30 capsule 1   lisdexamfetamine (VYVANSE) 40 MG capsule Take 1 capsule (40 mg total) by mouth every morning. 30 capsule 0   loratadine (CLARITIN) 10 MG tablet Take 10 mg by mouth daily.     Multiple Vitamins-Minerals (ADULT GUMMY) CHEW Chew 1 capsule by mouth daily.     venlafaxine XR (EFFEXOR XR) 37.5 MG 24 hr capsule Take 1 capsule (37.5 mg total) by mouth daily with breakfast. 90 capsule 1   VITAMIN D-VITAMIN K PO Take 1 capsule by mouth daily.     ondansetron (ZOFRAN-ODT) 8 MG disintegrating tablet Take 8 mg by mouth every 8 (eight) hours as needed. (Patient not taking: Reported on 12/31/2022)     phenazopyridine (PYRIDIUM) 200 MG tablet Take 200 mg by mouth 3 (three) times daily. (Patient not taking: Reported on 12/31/2022)     Facility-Administered Medications Prior to Visit  Medication Dose Route Frequency Provider Last Rate Last Admin   dextrose 5 % solution   Intravenous Continuous Armbruster, Willaim Rayas,  MD        Allergies  Allergen Reactions   Bee Venom Anaphylaxis   Flagyl [Metronidazole] Anaphylaxis   Iodine Anaphylaxis   Shellfish Allergy Anaphylaxis   Tramadol Hcl Other (See Comments)    Showed signs of stroke   Wasp Venom Anaphylaxis   Gluten Meal     Bloating    Lactose Intolerance (Gi) Hives    Bloating,    Melatonin     Nightmares     Review of Systems  As per HPI  PE:    03/05/2023    8:34 AM 12/31/2022    8:20 AM 11/11/2022    7:10 PM  Vitals with BMI  Weight 160 lbs 13 oz 167 lbs 6 oz   Systolic 116 137 409  Diastolic 80 88 76  Pulse 91 90 84     Physical Exam  Gen: Alert, well appearing.  Patient is oriented to person, place, time, and  situation. Right thumb and wrist without swelling or tenderness.  She does have a bit of tenderness with ultrasound probe pressure over the thenar eminence.  Range of motion of thumb and fingers fully intact.  Thumb strength 5 out of 5.  De Quervain's negative.  Phalen's negative.  Tinel's elicits slight tingling in the index and middle finger of the right hand.  LABS:  Last CBC Lab Results  Component Value Date   WBC 4.7 06/24/2022   HGB 14.1 06/24/2022   HCT 40.9 06/24/2022   MCV 92.5 06/24/2022   MCH 31.9 06/24/2022   RDW 12.2 06/24/2022   PLT 276 06/24/2022   Last metabolic panel Lab Results  Component Value Date   GLUCOSE 78 06/24/2022   NA 140 06/24/2022   K 4.6 06/24/2022   CL 103 06/24/2022   CO2 30 06/24/2022   BUN 13 06/24/2022   CREATININE 1.18 (H) 06/24/2022   GFRNONAA 53 (L) 10/25/2021   CALCIUM 10.0 06/24/2022   PROT 7.3 06/24/2022   ALBUMIN 4.5 10/25/2021   BILITOT 0.6 06/24/2022   ALKPHOS 83 10/25/2021   AST 30 06/24/2022   ALT 32 (H) 06/24/2022   ANIONGAP 7 10/25/2021   IMPRESSION AND PLAN:  #1 right thumb pain over thenar eminence, intermittent. Unclear etiology but seems more consistent with overuse, possibly due to excessive texting on her phone. Bedside MSK ultrasound showed normal median nerve.  Normal motion and appearance of flexor pollicis longus tendon.  Visible cortex of first metacarpal bone and TMC normal.  No increased flow on PDI. Thenar musculature with normal echogenicity.  Will go ahead and check thumb radiographs. Continue splint. Try to minimize overuse.  If not improving over the next couple weeks will have hand specialist see her.  An After Visit Summary was printed and given to the patient.  FOLLOW UP: No follow-ups on file.  Signed:  Santiago Bumpers, MD           03/05/2023

## 2023-03-10 ENCOUNTER — Other Ambulatory Visit: Payer: Self-pay

## 2023-03-10 ENCOUNTER — Encounter (HOSPITAL_BASED_OUTPATIENT_CLINIC_OR_DEPARTMENT_OTHER): Payer: Self-pay

## 2023-03-10 ENCOUNTER — Emergency Department (HOSPITAL_BASED_OUTPATIENT_CLINIC_OR_DEPARTMENT_OTHER)
Admission: EM | Admit: 2023-03-10 | Discharge: 2023-03-10 | Disposition: A | Discharge status: Home / Self Care | Payer: Medicaid Other | Attending: Emergency Medicine | Admitting: Emergency Medicine

## 2023-03-10 DIAGNOSIS — E039 Hypothyroidism, unspecified: Secondary | ICD-10-CM | POA: Diagnosis not present

## 2023-03-10 DIAGNOSIS — T63441A Toxic effect of venom of bees, accidental (unintentional), initial encounter: Secondary | ICD-10-CM | POA: Diagnosis not present

## 2023-03-10 MED ORDER — METHYLPREDNISOLONE SODIUM SUCC 125 MG IJ SOLR
125.0000 mg | Freq: Once | INTRAMUSCULAR | Status: AC
  Administered 2023-03-10: 125 mg via INTRAVENOUS
  Filled 2023-03-10: qty 2

## 2023-03-10 MED ORDER — PREDNISONE 10 MG PO TABS: 60.0000 mg | ORAL_TABLET | Freq: Every day | ORAL | 0 refills | Status: AC

## 2023-03-10 MED ORDER — LORATADINE 10 MG PO TABS
10.0000 mg | ORAL_TABLET | Freq: Once | ORAL | Status: AC
  Administered 2023-03-10: 10 mg via ORAL
  Filled 2023-03-10: qty 1

## 2023-03-10 MED ORDER — EPINEPHRINE 0.3 MG/0.3ML IJ SOAJ: 0.3000 mg | INTRAMUSCULAR | 0 refills | Status: AC | PRN

## 2023-03-10 MED ORDER — FAMOTIDINE 20 MG PO TABS
40.0000 mg | ORAL_TABLET | Freq: Once | ORAL | Status: AC
  Administered 2023-03-10: 40 mg via ORAL
  Filled 2023-03-10: qty 2

## 2023-03-10 NOTE — ED Notes (Signed)
Patient verbalizes understanding of discharge instructions. Opportunity for questioning and answers were provided. Patient discharged from ED. Iv removed x1

## 2023-03-10 NOTE — ED Provider Notes (Signed)
 EMERGENCY DEPARTMENT AT [facility] Provider Note   CSN: [id number] Arrival date & time: 03/10/23  1353     History Chief Complaint  Patient presents with   Insect Bite    Bee Sting   Allergic Reaction    Candace Allen is a 57 y.o. female.  Patient with medical history significant for GERD, IBS, hypothyroidism, and anaphylaxis presents emergency department concerns of allergic reaction.  Patient reportedly was stung by a bee on her left wrist at about 1:10 PM.  Patient began to feel some chest tightness and a cottonmouth feeling so self-administered EpiPen at 1:35 PM.  Also took a dose of Benadryl 25 mg.  Endorses some shakiness after the epinephrine but denies any chest pain shortness of breath at this time.  No dysphagia.    Allergic Reaction Presenting symptoms: rash        Home Medications Prior to Admission medications   Medication Sig Start Date End Date Taking? Authorizing Provider  EPINEPHrine 0.3 mg/0.3 mL IJ SOAJ injection Inject 0.3 mg into the muscle as needed for anaphylaxis. 03/10/23  Yes Smitty Knudsen, PA-C  predniSONE (DELTASONE) 10 MG tablet Take 6 tablets (60 mg total) by mouth daily for 4 days. 03/10/23 03/14/23 Yes Smitty Knudsen, PA-C  EPINEPHrine 0.3 mg/0.3 mL IJ SOAJ injection Inject 0.3 mg into the muscle as needed for anaphylaxis. 11/11/22   Sloan Leiter, DO  famotidine (PEPCID) 40 MG tablet 1 tab po bid as needed for heartburn/reflux 01/20/23   McGowen, Maryjean Morn, MD  linaclotide [facility]) 290 MCG CAPS capsule TAKE 1 CAPSULE(290 MCG) BY MOUTH DAILY BEFORE BREAKFAST 02/07/23   McGowen, Maryjean Morn, MD  lisdexamfetamine (VYVANSE) 40 MG capsule Take 1 capsule (40 mg total) by mouth every morning. 02/25/23   McGowen, Maryjean Morn, MD  loratadine (CLARITIN) 10 MG tablet Take 10 mg by mouth daily.    [provider]  Multiple Vitamins-Minerals (ADULT GUMMY) CHEW Chew 1 capsule by mouth daily.    [provider]  ondansetron  (ZOFRAN-ODT) 8 MG disintegrating tablet Take 8 mg by mouth every 8 (eight) hours as needed. Patient not taking: Reported on 12/31/2022 11/04/22   [provider]  phenazopyridine (PYRIDIUM) 200 MG tablet Take 200 mg by mouth 3 (three) times daily. Patient not taking: Reported on 12/31/2022 11/04/22   [provider]  venlafaxine XR (EFFEXOR XR) 37.5 MG 24 hr capsule Take 1 capsule (37.5 mg total) by mouth daily with breakfast. 12/23/22   McGowen, Maryjean Morn, MD  VITAMIN D-VITAMIN K PO Take 1 capsule by mouth daily.    [provider]      Allergies    Bee venom, Flagyl [metronidazole], Iodine, Shellfish allergy, Tramadol hcl, Wasp venom, Gluten meal, Lactose intolerance (gi), and Melatonin    Review of Systems   Review of Systems  Skin:  Positive for rash.  All other systems reviewed and are negative.   Physical Exam Updated Vital Signs BP 136/79   Pulse 87   Temp 98.1 F (36.7 C)   Resp 13   Ht 5\' 11"  (1.803 m)   Wt 73 kg   LMP 02/19/2006 (Approximate)   SpO2 100%   BMI 22.45 kg/m  Physical Exam Vitals and nursing note reviewed.  Constitutional:      General: She is not in acute distress.    Appearance: She is well-developed.  HENT:     Head: Normocephalic and atraumatic.  Eyes:     Conjunctiva/sclera: Conjunctivae  normal.  Cardiovascular:     Rate and Rhythm: Normal rate and regular rhythm.     Heart sounds: No murmur heard. Pulmonary:     Effort: Pulmonary effort is normal. No respiratory distress.     Breath sounds: Normal breath sounds. No wheezing.  Abdominal:     Palpations: Abdomen is soft.     Tenderness: There is no abdominal tenderness.  Musculoskeletal:        General: No swelling.     Cervical back: Neck supple.  Skin:    General: Skin is warm and dry.     Capillary Refill: Capillary refill takes less than 2 seconds.     Findings: No rash or wound.     Comments: Small area of swelling at the left wrist from insect sting   Neurological:     Mental Status: She is alert.  Psychiatric:        Mood and Affect: Mood normal.     ED Results / Procedures / Treatments   Labs (all labs ordered are listed, but only abnormal results are displayed) Labs Reviewed - No data to display  EKG None  Radiology No results found.  Procedures Procedures   Medications Ordered in ED Medications  loratadine (CLARITIN) tablet 10 mg (10 mg Oral Given 03/10/23 1506)  famotidine (PEPCID) tablet 40 mg (40 mg Oral Given 03/10/23 1506)  methylPREDNISolone sodium succinate (SOLU-MEDROL) 125 mg/2 mL injection 125 mg (125 mg Intravenous Given 03/10/23 1506)    ED Course/ Medical Decision Making/ A&P                               Medical Decision Making Risk OTC drugs. Prescription drug management.   This patient presents to the ED for concern of allergic reaction.  Differential diagnosis includes anaphylaxis, urticarial rash, hives, dysphagia   Medicines ordered and prescription drug management:  I ordered medication including famotidine, loratadine, Solu-Medrol for allergic reaction Reevaluation of the patient after these medicines showed that the patient improved I have reviewed the patients home medicines and have made adjustments as needed   Problem List / ED Course:  Patient with history of anaphylaxis presents to the emergency department concerns of a bee sting.  Other past medical history significant for IBS, GERD.  Patient reports that she had a bee sting at approximately 1:10 PM this afternoon and self-administered epinephrine at 1:35 PM.  Also took a dose of Benadryl 25 mg.  Reports that she had some mild swelling to the sting site but this has not returned.  Has not had to repeat any doses of epinephrine.  States that previously she has had more severe reactions and feels significantly more mild.  Not in any acute respiratory distress at this time.  Will plan observation with no for approximately 2 hours from  administration of epinephrine.  Patient remained stable, will anticipate likely discharge home with continued course of steroids.  Will also represcribe patient's EpiPen and to allow patient to have this at home. Patient administered dose of Pepcid, Claritin, and Solu-Medrol here in the emergency department for suspected allergic eczema.  Patient not appearing to be in any anaphylactic response on initial assessment. After reevaluation after administration of medications, patient remained stable without any regression or recurrence of symptoms.  No epinephrine has been administered here in the ER. Given the patient has remained stable without recurrence in symptoms, I believe patient is safe and stable for discharge  with outpatient follow-up with primary care provider.  Prescription for EpiPen refilled as well as a short course of prednisone for the next 4 days.  Advised patient to take these medications as prescribed and return the emergency department if she has any recurrence in symptoms.  Patient agreed with treatment plan verbalized understanding strict return precautions.  Patient discharged home in stable condition.  Final Clinical Impression(s) / ED Diagnoses Final diagnoses:  Bee sting reaction, accidental or unintentional, initial encounter    Rx / DC Orders ED Discharge Orders          Ordered    EPINEPHrine 0.3 mg/0.3 mL IJ SOAJ injection  As needed        03/10/23 1601    predniSONE (DELTASONE) 10 MG tablet  Daily        03/10/23 1601              Smitty Knudsen, PA-C 03/10/23 1603    Royanne Foots, DO 03/11/23 1650

## 2023-03-10 NOTE — Discharge Instructions (Signed)
You are seen in the emergency department today for an allergic reaction to a bee sting.  You administered epinephrine to resolve prior to arriving here in the emergency department which appears to likely have stabilized the reaction.  You were given a dose of several antihistamines and the steroid here you did not have any recurrence in her symptoms.  I have sent a refill of your EpiPen to your pharmacy and also a short course of prednisone to continue take over the next few days.  If you are to have any recurrence in symptoms, return to the emergency department after administering a dose of epinephrine at home.  If concern that you do not have your EpiPen with you, call 911.

## 2023-03-10 NOTE — ED Triage Notes (Addendum)
Patient arrives with complaints being stung by a bee prior to arrival. Patient was stung on her left wrist. Gave herself emergency epi-pen at 1335. Also took one benadryl tablet as well.  Reports no pain. Reports some shakiness post epi.  No shortness of breath or mouth swelling.

## 2023-03-12 ENCOUNTER — Other Ambulatory Visit: Payer: Self-pay | Admitting: Family Medicine

## 2023-03-18 DIAGNOSIS — Z419 Encounter for procedure for purposes other than remedying health state, unspecified: Secondary | ICD-10-CM | POA: Diagnosis not present

## 2023-03-24 ENCOUNTER — Other Ambulatory Visit: Payer: Self-pay | Admitting: Family Medicine

## 2023-03-24 DIAGNOSIS — M9901 Segmental and somatic dysfunction of cervical region: Secondary | ICD-10-CM | POA: Diagnosis not present

## 2023-03-24 DIAGNOSIS — M9902 Segmental and somatic dysfunction of thoracic region: Secondary | ICD-10-CM | POA: Diagnosis not present

## 2023-03-24 DIAGNOSIS — M5383 Other specified dorsopathies, cervicothoracic region: Secondary | ICD-10-CM | POA: Diagnosis not present

## 2023-03-25 ENCOUNTER — Other Ambulatory Visit: Payer: Self-pay | Admitting: Family Medicine

## 2023-03-25 DIAGNOSIS — M5383 Other specified dorsopathies, cervicothoracic region: Secondary | ICD-10-CM | POA: Diagnosis not present

## 2023-03-25 DIAGNOSIS — M9902 Segmental and somatic dysfunction of thoracic region: Secondary | ICD-10-CM | POA: Diagnosis not present

## 2023-03-25 DIAGNOSIS — M9901 Segmental and somatic dysfunction of cervical region: Secondary | ICD-10-CM | POA: Diagnosis not present

## 2023-03-25 MED ORDER — LISDEXAMFETAMINE DIMESYLATE 40 MG PO CAPS: 40.0000 mg | ORAL_CAPSULE | ORAL | 0 refills | Status: DC

## 2023-03-27 DIAGNOSIS — M9902 Segmental and somatic dysfunction of thoracic region: Secondary | ICD-10-CM | POA: Diagnosis not present

## 2023-03-27 DIAGNOSIS — M9901 Segmental and somatic dysfunction of cervical region: Secondary | ICD-10-CM | POA: Diagnosis not present

## 2023-03-27 DIAGNOSIS — M5383 Other specified dorsopathies, cervicothoracic region: Secondary | ICD-10-CM | POA: Diagnosis not present

## 2023-03-31 DIAGNOSIS — M5383 Other specified dorsopathies, cervicothoracic region: Secondary | ICD-10-CM | POA: Diagnosis not present

## 2023-03-31 DIAGNOSIS — M9901 Segmental and somatic dysfunction of cervical region: Secondary | ICD-10-CM | POA: Diagnosis not present

## 2023-03-31 DIAGNOSIS — M9902 Segmental and somatic dysfunction of thoracic region: Secondary | ICD-10-CM | POA: Diagnosis not present

## 2023-04-01 DIAGNOSIS — M5383 Other specified dorsopathies, cervicothoracic region: Secondary | ICD-10-CM | POA: Diagnosis not present

## 2023-04-01 DIAGNOSIS — M9902 Segmental and somatic dysfunction of thoracic region: Secondary | ICD-10-CM | POA: Diagnosis not present

## 2023-04-01 DIAGNOSIS — M9901 Segmental and somatic dysfunction of cervical region: Secondary | ICD-10-CM | POA: Diagnosis not present

## 2023-04-02 ENCOUNTER — Ambulatory Visit (INDEPENDENT_AMBULATORY_CARE_PROVIDER_SITE_OTHER): Payer: Medicaid Other | Admitting: Family Medicine

## 2023-04-02 ENCOUNTER — Encounter: Payer: Self-pay | Admitting: Family Medicine

## 2023-04-02 VITALS — BP 117/74 | HR 84 | Wt 164.8 lb

## 2023-04-02 DIAGNOSIS — E039 Hypothyroidism, unspecified: Secondary | ICD-10-CM | POA: Diagnosis not present

## 2023-04-02 DIAGNOSIS — N1831 Chronic kidney disease, stage 3a: Secondary | ICD-10-CM

## 2023-04-02 DIAGNOSIS — F411 Generalized anxiety disorder: Secondary | ICD-10-CM | POA: Diagnosis not present

## 2023-04-02 DIAGNOSIS — F988 Other specified behavioral and emotional disorders with onset usually occurring in childhood and adolescence: Secondary | ICD-10-CM | POA: Diagnosis not present

## 2023-04-02 DIAGNOSIS — M9901 Segmental and somatic dysfunction of cervical region: Secondary | ICD-10-CM | POA: Diagnosis not present

## 2023-04-02 DIAGNOSIS — M24851 Other specific joint derangements of right hip, not elsewhere classified: Secondary | ICD-10-CM

## 2023-04-02 DIAGNOSIS — R202 Paresthesia of skin: Secondary | ICD-10-CM

## 2023-04-02 DIAGNOSIS — Z79899 Other long term (current) drug therapy: Secondary | ICD-10-CM

## 2023-04-02 DIAGNOSIS — M25551 Pain in right hip: Secondary | ICD-10-CM | POA: Diagnosis not present

## 2023-04-02 DIAGNOSIS — M9902 Segmental and somatic dysfunction of thoracic region: Secondary | ICD-10-CM | POA: Diagnosis not present

## 2023-04-02 DIAGNOSIS — M5383 Other specified dorsopathies, cervicothoracic region: Secondary | ICD-10-CM | POA: Diagnosis not present

## 2023-04-02 LAB — BASIC METABOLIC PANEL
BUN: 17 mg/dL (ref 6–23)
CO2: 29 meq/L (ref 19–32)
Calcium: 10.2 mg/dL (ref 8.4–10.5)
Chloride: 101 meq/L (ref 96–112)
Creatinine, Ser: 1.26 mg/dL — ABNORMAL HIGH (ref 0.40–1.20)
GFR: 47.06 mL/min — ABNORMAL LOW (ref >60.00)
Glucose, Bld: 82 mg/dL (ref 70–99)
Potassium: 4.5 meq/L (ref 3.5–5.1)
Sodium: 138 meq/L (ref 135–145)

## 2023-04-02 LAB — TSH: TSH: 4.1 u[IU]/mL (ref 0.35–5.50)

## 2023-04-02 MED ORDER — LINACLOTIDE 290 MCG PO CAPS: 290.0000 ug | ORAL_CAPSULE | Freq: Every day | ORAL | 3 refills | Status: DC

## 2023-04-02 MED ORDER — FAMOTIDINE 40 MG PO TABS: ORAL_TABLET | ORAL | 1 refills | Status: DC

## 2023-04-02 MED ORDER — LISDEXAMFETAMINE DIMESYLATE 40 MG PO CAPS: 40.0000 mg | ORAL_CAPSULE | ORAL | 0 refills | Status: DC

## 2023-04-02 NOTE — Progress Notes (Signed)
OFFICE VISIT  04/02/2023  CC:  Chief Complaint  Patient presents with   Medical Management of Chronic Issues    Pt states for the last 2 months she c/o of itching/irritation of the L arm. States it comes in waves; burning, itching, tingling. Has not changed body wash, shampoo, lotion, etc. Has taken benedryl and applied cortisone cream but has not found any relief.     Patient is a 57 y.o. female who presents for 50-month follow-up adult ADD and anxiety as well as chronic renal insufficiency stage III. A/P as of last visit: "#1 adult ADD. Doing well on Vyvanse 40 mg a day. Controlled substance contract up-to-date. Will do urine drug screen at next follow-up in 3 months.   #2 GAD. Doing well on Effexor XR 37.5 mg a day.   #3 IBS, alternating diarrhea and constipation. She gets some improvement with Linzess 290 mcg daily but still has problems. Multiple constipation medications in the past have not been effective.  Trouble with hydrating adequately lately due to all the heat and sweating. She will try to improve hydration and use a combination of over-the-counter meds to add to her Linzess periodically.   4.  Left suprapubic pain, some urinary urgency recently. These symptoms are often indicative of UTI and her. Point-of-care UA today: NORMAL. Urine culture sent. Start Keflex 500 mg 3 times daily x 3 days."  INTERIM HX: She recently started to develop some intermittent itching sensation in her left arm.  Some more vague type of tingling and occasional sharp shooting pain occurs in the same area.  Initially it was most of her forearm.  That then resolved and has been more of over the elbow level.  No neck pain.  No rash. No known contact irritants or allergens.  Lately has been having some pain in the anterior aspect of the right hip.  She hears popping sometimes.  No preceding overuse or injury.  The pain seems to come and go and is not severe.  No lateral hip pain or gluteal pain.   No low back pain.   Pt states all is going well with the med at current dosing (vyvanse 40 every day): much improved focus, concentration, task completion.  Less frustration, better multitasking, less impulsivity and restlessness.  Mood is stable. No side effects from the medication.  PMP AWARE reviewed today: most recent rx for Vyvanse 40 mg was filled 03/25/2023, # 30, rx by me. No red flags.  Past Medical History:  Diagnosis Date   Attention deficit disorder (ADD) in adult    Chronic renal insufficiency, stage 3 (moderate) (HCC)    ?d/t dexilant   Diverticulosis 02/19/2018   By colonoscopy 2016   GERD (gastroesophageal reflux disease)    Hx of concussion    Approximately 2016   Hypothyroidism    question of-->no hx of thyroid lab abnormalities in record   IBS (irritable bowel syndrome)     Past Surgical History:  Procedure Laterality Date   CHOLECYSTECTOMY N/A 11/08/2021   Procedure: LAPAROSCOPIC CHOLECYSTECTOMY;  Surgeon: Lewie Chamber, DO;  Location: [facility];  Service: General;  Laterality: N/A;   COLONOSCOPY  2016   2016 no polyps. Rpt at [facility] 2022 per pt, normal, no record available   ENDOMETRIAL ABLATION     SHOULDER SURGERY Left    Approximately 2016   TRANSTHORACIC ECHOCARDIOGRAM     01/2021 NORMAL   TUBAL LIGATION     UPPER GASTROINTESTINAL ENDOSCOPY  11/10/2019   neg  Outpatient Medications Prior to Visit  Medication Sig Dispense Refill   EPINEPHrine 0.3 mg/0.3 mL IJ SOAJ injection Inject 0.3 mg into the muscle as needed for anaphylaxis. 1 each 0   EPINEPHrine 0.3 mg/0.3 mL IJ SOAJ injection Inject 0.3 mg into the muscle as needed for anaphylaxis. 1 each 0   loratadine (CLARITIN) 10 MG tablet Take 10 mg by mouth daily.     Multiple Vitamins-Minerals (ADULT GUMMY) CHEW Chew 1 capsule by mouth daily.     ondansetron (ZOFRAN-ODT) 8 MG disintegrating tablet Take 8 mg by mouth every 8 (eight) hours as needed.     phenazopyridine (PYRIDIUM) 200 MG tablet  Take 200 mg by mouth 3 (three) times daily.     venlafaxine XR (EFFEXOR XR) 37.5 MG 24 hr capsule Take 1 capsule (37.5 mg total) by mouth daily with breakfast. 90 capsule 1   VITAMIN D-VITAMIN K PO Take 1 capsule by mouth daily.     famotidine (PEPCID) 40 MG tablet 1 tab po bid as needed for heartburn/reflux 60 tablet 1   linaclotide (LINZESS) 290 MCG CAPS capsule TAKE 1 CAPSULE(290 MCG) BY MOUTH DAILY BEFORE BREAKFAST 30 capsule 0   lisdexamfetamine (VYVANSE) 40 MG capsule Take 1 capsule (40 mg total) by mouth every morning. 30 capsule 0   Facility-Administered Medications Prior to Visit  Medication Dose Route Frequency Provider Last Rate Last Admin   dextrose 5 % solution   Intravenous Continuous Armbruster, Willaim Rayas, MD        Allergies  Allergen Reactions   Bee Venom Anaphylaxis   Flagyl [Metronidazole] Anaphylaxis   Iodine Anaphylaxis   Shellfish Allergy Anaphylaxis   Tramadol Hcl Other (See Comments)    Showed signs of stroke   Wasp Venom Anaphylaxis   Gluten Meal     Bloating    Lactose Intolerance (Gi) Hives    Bloating,    Melatonin     Nightmares     Review of Systems As per HPI  PE:    04/02/2023    8:52 AM 03/10/2023    4:08 PM 03/10/2023    2:41 PM  Vitals with BMI  Weight 164 lbs 13 oz    Systolic 117 136 413  Diastolic 74 80 79  Pulse 84 76 87     Physical Exam Exam chaperoned by Cloe Motsinger, CMA  Gen: Alert, well appearing.  Patient is oriented to person, place, time, and situation. Arms appear normal, without rash or swelling.  No tenderness.  Strength is normal. She has some mild discomfort to palpation over the right groin/hip. No lateral hip tenderness.  When right hip is in the flexed and abducted position she has an audible snap over the anterior hip with repositioning maneuver through adduction and extension of the hip.  This does not particularly cause much pain. Bedside MSK ultrasound today showed a normal iliopsoas tendon and no visual  snapping of the psoas tendon was noted during dynamic imaging.  LABS:  Last CBC Lab Results  Component Value Date   WBC 4.7 06/24/2022   HGB 14.1 06/24/2022   HCT 40.9 06/24/2022   MCV 92.5 06/24/2022   MCH 31.9 06/24/2022   RDW 12.2 06/24/2022   PLT 276 06/24/2022   Last metabolic panel Lab Results  Component Value Date   GLUCOSE 78 06/24/2022   NA 140 06/24/2022   K 4.6 06/24/2022   CL 103 06/24/2022   CO2 30 06/24/2022   BUN 13 06/24/2022   CREATININE 1.18 (H) 06/24/2022  GFRNONAA 53 (L) 10/25/2021   CALCIUM 10.0 06/24/2022   PROT 7.3 06/24/2022   ALBUMIN 4.5 10/25/2021   BILITOT 0.6 06/24/2022   ALKPHOS 83 10/25/2021   AST 30 06/24/2022   ALT 32 (H) 06/24/2022   ANIONGAP 7 10/25/2021   Lab Results  Component Value Date   TSH 1.47 06/24/2022   IMPRESSION AND PLAN:  #1 adult ADD, doing well on Vyvanse 40 mg a day. Controlled substance contract is up-to-date. Urine drug screen today.  2.  Left arm itching/paresthesias.  Unknown etiology.  Observe for changes.  3.  Right hip pain.  She is having some medial snapping hip syndrome. Short-term activity modification, stretching and strengthening recommended.  4. History of hypothyroidism.  TSH was 1.47 in January of this year after she had decreased her levothyroxine to 75 mcg a day.  At that point since her history of this problem was unclear we went ahead and discontinued the levothyroxine. Recheck TSH today.  An After Visit Summary was printed and given to the patient.  FOLLOW UP: Return in about 3 months (around 07/03/2023) for annual CPE (fasting). Next CPE 06/2023  Signed:  Santiago Bumpers, MD           04/02/2023

## 2023-04-03 DIAGNOSIS — M9902 Segmental and somatic dysfunction of thoracic region: Secondary | ICD-10-CM | POA: Diagnosis not present

## 2023-04-03 DIAGNOSIS — M9901 Segmental and somatic dysfunction of cervical region: Secondary | ICD-10-CM | POA: Diagnosis not present

## 2023-04-03 DIAGNOSIS — M5383 Other specified dorsopathies, cervicothoracic region: Secondary | ICD-10-CM | POA: Diagnosis not present

## 2023-04-04 LAB — DRUG MONITORING PANEL 376104, URINE
Amphetamine: 1150 ng/mL — ABNORMAL HIGH (ref <250)
Amphetamines: POSITIVE ng/mL — AB (ref <500)
Barbiturates: NEGATIVE ng/mL (ref <300)
Benzodiazepines: NEGATIVE ng/mL (ref <100)
Cocaine Metabolite: NEGATIVE ng/mL (ref <150)
Desmethyltramadol: NEGATIVE ng/mL (ref <100)
Methamphetamine: NEGATIVE ng/mL (ref <250)
Opiates: NEGATIVE ng/mL (ref <100)
Oxycodone: NEGATIVE ng/mL (ref <100)
Tramadol: NEGATIVE ng/mL (ref <100)

## 2023-04-04 LAB — DM TEMPLATE

## 2023-04-18 DIAGNOSIS — Z419 Encounter for procedure for purposes other than remedying health state, unspecified: Secondary | ICD-10-CM | POA: Diagnosis not present

## 2023-04-24 ENCOUNTER — Other Ambulatory Visit: Payer: Self-pay | Admitting: Family Medicine

## 2023-04-24 MED ORDER — LISDEXAMFETAMINE DIMESYLATE 40 MG PO CAPS: 40.0000 mg | ORAL_CAPSULE | ORAL | 0 refills | Status: DC

## 2023-04-24 NOTE — Telephone Encounter (Signed)
Requesting: lisdexamfetamine (VYVANSE) 40 MG capsule  Contract: N/A, not signed UDS: 04/02/23 Last Visit: 04/02/23 Next Visit: 07/08/23 Last Refill: 04/02/23 (30,0)  Please Advise. Med pending

## 2023-04-25 ENCOUNTER — Telehealth: Payer: Self-pay

## 2023-04-25 NOTE — Telephone Encounter (Signed)
Please assist with completing PA

## 2023-04-28 ENCOUNTER — Other Ambulatory Visit: Payer: Self-pay

## 2023-04-28 MED ORDER — FAMOTIDINE 40 MG PO TABS: ORAL_TABLET | ORAL | 0 refills | Status: DC

## 2023-04-28 MED ORDER — VENLAFAXINE HCL ER 37.5 MG PO CP24: 37.5000 mg | ORAL_CAPSULE | Freq: Every day | ORAL | 0 refills | Status: DC

## 2023-04-28 NOTE — Telephone Encounter (Signed)
Patient requested to have medication refilled thru [facility] order pharmacy. New prescriptions sent. Patient is currently scheduled for her next follow up on 07/08/23.

## 2023-04-30 ENCOUNTER — Other Ambulatory Visit (HOSPITAL_COMMUNITY): Payer: Self-pay

## 2023-05-01 ENCOUNTER — Other Ambulatory Visit (HOSPITAL_COMMUNITY): Payer: Self-pay

## 2023-05-01 ENCOUNTER — Telehealth: Payer: Self-pay

## 2023-05-01 NOTE — Telephone Encounter (Signed)
Pharmacy Patient Advocate Encounter  Received notification from [facility] Medicaid that Prior Authorization for [place] 40MG  has been APPROVED from 05/01/23 to 06/16/24   PA #/Case ID/Reference #: [id number]

## 2023-05-01 NOTE — Telephone Encounter (Signed)
Pharmacy Patient Advocate Encounter   Received notification from Physician's Office that prior authorization for Lisdexamfetamine Dimesylate 40MG  capsules is required/requested.   Insurance verification completed.   The patient is insured through [facility] [place] .   Per test claim: PA required; PA submitted to above mentioned insurance via CoverMyMeds Key/confirmation #/EOC Key: [id number] Status is pending

## 2023-05-02 ENCOUNTER — Other Ambulatory Visit (HOSPITAL_COMMUNITY): Payer: Self-pay

## 2023-05-02 NOTE — Telephone Encounter (Signed)
PA request has been Approved. New Encounter created for follow up. For additional info see Pharmacy Prior Auth telephone encounter from 05/01/23.

## 2023-06-24 ENCOUNTER — Other Ambulatory Visit: Payer: Self-pay | Admitting: Family Medicine

## 2023-06-24 ENCOUNTER — Other Ambulatory Visit (HOSPITAL_COMMUNITY): Payer: Self-pay

## 2023-06-24 ENCOUNTER — Other Ambulatory Visit: Payer: Self-pay

## 2023-06-24 MED ORDER — FAMOTIDINE 40 MG PO TABS
40.0000 mg | ORAL_TABLET | Freq: Two times a day (BID) | ORAL | 0 refills | Status: DC | PRN
  Filled 2023-06-24 – 2023-11-14 (×4): qty 180, 90d supply, fill #0

## 2023-06-25 ENCOUNTER — Other Ambulatory Visit: Payer: Self-pay | Admitting: Family Medicine

## 2023-06-25 ENCOUNTER — Telehealth: Payer: Self-pay

## 2023-06-25 ENCOUNTER — Encounter (HOSPITAL_BASED_OUTPATIENT_CLINIC_OR_DEPARTMENT_OTHER): Payer: Self-pay

## 2023-06-25 ENCOUNTER — Other Ambulatory Visit (HOSPITAL_BASED_OUTPATIENT_CLINIC_OR_DEPARTMENT_OTHER): Payer: Self-pay

## 2023-06-25 ENCOUNTER — Other Ambulatory Visit: Payer: Self-pay

## 2023-06-25 MED ORDER — LISDEXAMFETAMINE DIMESYLATE 40 MG PO CAPS: 40.0000 mg | ORAL_CAPSULE | ORAL | 0 refills | Status: DC

## 2023-06-25 NOTE — Telephone Encounter (Signed)
 Refill requested for Vyvanse sent to [facility] in [place]

## 2023-06-25 NOTE — Telephone Encounter (Signed)
 OK to fill vyvanse today

## 2023-06-25 NOTE — Telephone Encounter (Signed)
 Copied from CRM [id number]. Topic: Clinical - Medication Refill >> Jun 25, 2023  1:56 PM Corin V wrote: Most Recent Primary Care Visit:  Provider: CANDISE ALEENE DEL  Department: [facility]  Visit Type: OFFICE VISIT  Date: 04/02/2023  Medication: lisdexamfetamine (VYVANSE ) 40 MG capsule  Has the patient contacted their pharmacy? Yes- she was just notified that they do not have it in stock at normal pharmacy and she is completely out and has not taken it yet today. (Agent: If no, request that the patient contact the pharmacy for the refill. If patient does not wish to contact the pharmacy document the reason why and proceed with request.) (Agent: If yes, when and what did the pharmacy advise?)  Is this the correct pharmacy for this prescription? Yes If no, delete pharmacy and type the correct one.  This is the patient's preferred pharmacy:  [facility] in [place], [place]  Has the prescription been filled recently? No  Is the patient out of the medication? Yes  Has the patient been seen for an appointment in the last year OR does the patient have an upcoming appointment? No  Can we respond through MyChart? Yes  Agent: Please be advised that Rx refills may take up to 3 business days. We ask that you follow-up with your pharmacy.

## 2023-06-27 ENCOUNTER — Other Ambulatory Visit: Payer: Self-pay

## 2023-07-08 ENCOUNTER — Encounter: Payer: Medicaid Other | Admitting: Family Medicine

## 2023-07-17 ENCOUNTER — Ambulatory Visit (INDEPENDENT_AMBULATORY_CARE_PROVIDER_SITE_OTHER): Payer: Medicaid Other | Admitting: Family Medicine

## 2023-07-17 VITALS — BP 136/77 | HR 86 | Temp 98.2°F | Ht 71.0 in | Wt 158.0 lb

## 2023-07-17 DIAGNOSIS — M79644 Pain in right finger(s): Secondary | ICD-10-CM

## 2023-07-17 DIAGNOSIS — N1831 Chronic kidney disease, stage 3a: Secondary | ICD-10-CM

## 2023-07-17 DIAGNOSIS — E039 Hypothyroidism, unspecified: Secondary | ICD-10-CM | POA: Diagnosis not present

## 2023-07-17 DIAGNOSIS — F988 Other specified behavioral and emotional disorders with onset usually occurring in childhood and adolescence: Secondary | ICD-10-CM | POA: Diagnosis not present

## 2023-07-17 DIAGNOSIS — E782 Mixed hyperlipidemia: Secondary | ICD-10-CM | POA: Insufficient documentation

## 2023-07-17 DIAGNOSIS — R944 Abnormal results of kidney function studies: Secondary | ICD-10-CM | POA: Diagnosis not present

## 2023-07-17 DIAGNOSIS — K582 Mixed irritable bowel syndrome: Secondary | ICD-10-CM | POA: Diagnosis not present

## 2023-07-17 DIAGNOSIS — E7800 Pure hypercholesterolemia, unspecified: Secondary | ICD-10-CM | POA: Insufficient documentation

## 2023-07-17 NOTE — Progress Notes (Addendum)
New Patient Office Visit  Subjective   Patient ID: Candace Allen, female    DOB: 1965/07/30  Age: 58 y.o. 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 Reports that she had an ablation with Ob/Gyn at [facility]. She is not able to find records. Reports that this was completed 18 years ago.  TIA believes that it was related to her husband's recent open heart surgery.  Concerns today include   Thyroid - she has not been taking supplementation for over one year. She was unsure of why she started it in the first place and states that she never had abnormal labs.   IBS  States that she started linzess in the past. She was taking 290 mcg. She states that it caused diarrhea so she took herself off of it in the past month. States that she was trying tumeric to see if that would help. Now she is more constipated. States that she is one extreme to the other. Reports that she struggles to drink appropriate amounts of water. Drinks decaf tea.   Joint pain  States that intermittently her right thumb is very painful, painful to touch. Used a brace for some time, states that it was too big for her and that it did not help much. Happens about 1x month. States that it lasts a few hours to days. Has not found anything that makes it better. Denies history of arthritis, RA, gout. Endorses some numbness and tingling. States that when she runs her hands go numb and throb. States that toes and fingertips go numb easily. This has been happening for ~30 years.   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 States that she could not finish a task. States  that she started vyvanse and that improved her symptoms. She has ~1.5 weeks left of last dose. Last filled on 06/26/2023. States that both of her children have ADHD as well.   Weight  States that she goes 5 days per week to the gym. 6-7 miles on bike or elliptical. Strength training and yoga.    Outpatient Encounter Medications as of 07/17/2023  Medication Sig   EPINEPHrine 0.3 mg/0.3 mL IJ SOAJ injection Inject 0.3 mg into the muscle as needed for anaphylaxis.   famotidine (PEPCID) 40 MG tablet Take 1 tablet (40 mg total) by mouth 2 (two) times daily as needed for heartburn/reflux.   linaclotide (LINZESS) 290 MCG CAPS capsule Take 1 capsule (290 mcg total) by mouth daily.   lisdexamfetamine (VYVANSE) 40 MG capsule Take 1 capsule (40 mg total) by mouth every morning.   loratadine (CLARITIN) 10 MG tablet Take 10 mg by mouth daily.   Multiple Vitamins-Minerals (ADULT GUMMY) CHEW Chew 1 capsule by mouth daily.   ondansetron (ZOFRAN-ODT) 8 MG disintegrating tablet Take 8 mg by mouth every 8 (eight) hours as needed.   venlafaxine XR (EFFEXOR XR) 37.5 MG 24 hr capsule Take 1 capsule (37.5 mg total) by mouth daily with breakfast.   VITAMIN D-VITAMIN K PO Take 1 capsule by mouth daily.   [DISCONTINUED] EPINEPHrine 0.3 mg/0.3 mL  IJ SOAJ injection Inject 0.3 mg into the muscle as needed for anaphylaxis.   [DISCONTINUED] phenazopyridine (PYRIDIUM) 200 MG tablet Take 200 mg by mouth 3 (three) times daily.   Facility-Administered Encounter Medications as of 07/17/2023  Medication   dextrose 5 % solution    Past Medical History:  Diagnosis Date   Attention deficit disorder (ADD) in adult    Chronic renal insufficiency, stage 3 (moderate) (HCC)    ?d/t dexilant   Diverticulosis 02/19/2018   By colonoscopy 2016   GERD (gastroesophageal reflux disease)    Hx of concussion    Approximately 2016   Hypothyroidism    question of-->no hx of thyroid lab abnormalities in record   IBS (irritable bowel  syndrome)     Past Surgical History:  Procedure Laterality Date   CHOLECYSTECTOMY N/A 11/08/2021   Procedure: LAPAROSCOPIC CHOLECYSTECTOMY;  Surgeon: Lewie Chamber, DO;  Location: [facility];  Service: General;  Laterality: N/A;   COLONOSCOPY  2016   2016 no polyps. Rpt at [facility] 2022 per pt, normal, no record available   ENDOMETRIAL ABLATION     SHOULDER SURGERY Left    Approximately 2016   TRANSTHORACIC ECHOCARDIOGRAM     01/2021 NORMAL   TUBAL LIGATION     UPPER GASTROINTESTINAL ENDOSCOPY  11/10/2019   neg    Family History  Problem Relation Age of Onset   Peripheral Artery Disease Mother    Stroke Mother    Other Father        Hx unknown to patient   Testicular cancer Brother    Esophageal cancer Brother    Early death Brother    Hypotension Maternal Grandmother    Aortic dissection Maternal Grandmother    Heart disease Maternal Grandfather    Diabetes Paternal Grandmother        AODM   Diabetes Paternal Grandfather        AODM   Healthy Daughter    Healthy Son    Ovarian cancer Paternal Great-grandmother     Social History   Socioeconomic History   Marital status: Married    Spouse name: Not on file   Number of children: 2   Years of education: Not on file   Highest education level: Associate degree: occupational, Scientist, product/process development, or vocational program  Occupational History   Occupation: Oceanographer  Tobacco Use   Smoking status: Never    Passive exposure: Never   Smokeless tobacco: Never  Vaping Use   Vaping status: Never Used  Substance and Sexual Activity   Alcohol use: Yes    Alcohol/week: 2.0 standard drinks of alcohol    Types: 2 Standard drinks or equivalent per week    Comment: social   Drug use: Never   Sexual activity: Yes    Birth control/protection: Surgical    Comment: Ablation  Other Topics Concern   Not on file  Social History Narrative   Married, 2 children   Educ: 12+.   Grew up in [place].  [place] area approximately 2016.    Occup: publisher->[employer]   No tob   Social Drivers of Corporate investment banker Strain: Low Risk  (07/17/2023)   Overall Financial Resource Strain (CARDIA)    Difficulty of Paying Living Expenses: Not hard at all  Food Insecurity: No Food Insecurity (07/17/2023)   Hunger Vital Sign    Worried About Running Out of Food in the Last Year: Never true    Ran Out of Food in the Last Year: Never  true  Transportation Needs: No Transportation Needs (07/17/2023)   PRAPARE - Administrator, Civil Service (Medical): No    Lack of Transportation (Non-Medical): No  Physical Activity: Sufficiently Active (07/17/2023)   Exercise Vital Sign    Days of Exercise per Week: 5 days    Minutes of Exercise per Session: 90 min  Stress: No Stress Concern Present (07/17/2023)   [employer] of Occupational Health - Occupational Stress Questionnaire    Feeling of Stress : Only a little  Social Connections: Unknown (07/17/2023)   Social Connection and Isolation Panel [NHANES]    Frequency of Communication with Friends and Family: More than three times a week    Frequency of Social Gatherings with Friends and Family: More than three times a week    Attends Religious Services: Patient declined    Database administrator or Organizations: No    Attends Engineer, structural: Not on file    Marital Status: Married  Catering manager Violence: Not At Risk (07/17/2023)   Humiliation, Afraid, Rape, and Kick questionnaire    Fear of Current or Ex-Partner: No    Emotionally Abused: No    Physically Abused: No    Sexually Abused: No   ROS As per HPI   Objective   LMP 02/19/2006 (Approximate)   Physical Exam Constitutional:      General: She is awake. She is not in acute distress.    Appearance: Normal appearance. She is well-developed and well-groomed. She is not ill-appearing, toxic-appearing or diaphoretic.  Cardiovascular:     Rate and Rhythm: Normal rate and  regular rhythm.     Pulses: Normal pulses.          Radial pulses are 2+ on the right side and 2+ on the left side.       Posterior tibial pulses are 2+ on the right side and 2+ on the left side.     Heart sounds: Normal heart sounds. No murmur heard.    No gallop.  Pulmonary:     Effort: Pulmonary effort is normal. No respiratory distress.     Breath sounds: Normal breath sounds. No stridor. No wheezing, rhonchi or rales.  Musculoskeletal:     Cervical back: Full passive range of motion without pain and neck supple.     Right lower leg: No edema.     Left lower leg: No edema.  Skin:    General: Skin is warm.     Capillary Refill: Capillary refill takes less than 2 seconds.  Neurological:     General: No focal deficit present.     Mental Status: She is alert, oriented to person, place, and time and easily aroused. Mental status is at baseline.     GCS: GCS eye subscore is 4. GCS verbal subscore is 5. GCS motor subscore is 6.     Motor: No weakness.  Psychiatric:        Attention and Perception: Attention and perception normal.        Mood and Affect: Mood and affect normal.        Speech: Speech normal.        Behavior: Behavior normal. Behavior is cooperative.        Thought Content: Thought content normal. Thought content does not include homicidal or suicidal ideation. Thought content does not include homicidal or suicidal plan.        Cognition and Memory: Cognition and memory normal.  Judgment: Judgment normal.        07/17/2023    1:10 PM 12/31/2022    8:28 AM 10/31/2022    8:45 AM  Depression screen PHQ 2/9  Decreased Interest 0 0 0  Down, Depressed, Hopeless 0 0 0  PHQ - 2 Score 0 0 0  Altered sleeping 0  0  Tired, decreased energy 0  0  Change in appetite 0  0  Feeling bad or failure about yourself  0  0  Trouble concentrating 0  0  Moving slowly or fidgety/restless 0  0  Suicidal thoughts 0  0  PHQ-9 Score 0  0  Difficult doing work/chores Not difficult  at all        07/17/2023    1:10 PM 12/31/2022    8:28 AM 06/24/2022    9:17 AM  GAD 7 : Generalized Anxiety Score  Nervous, Anxious, on Edge 0 0 0  Control/stop worrying 0 0 0  Worry too much - different things 0 0 0  Trouble relaxing 0 0 3  Restless 0 0 3  Easily annoyed or irritable 0 0 0  Afraid - awful might happen 0 0 0  Total GAD 7 Score 0 0 6  Anxiety Difficulty Not difficult at all Not difficult at all Extremely difficult   Assessment & Plan:  1. Irritable bowel syndrome with both constipation and diarrhea (Primary) Discussed with patient working with GI to find a balance of her medications. Provided patient with sample of Linzess 72 mcg to trial. Reviewed notes from McGowen, MD 04/02/23 - CBC with Differential/Platelet - CMP14+EGFR  2. Hypothyroidism (acquired) Believe this was an inaccurate diagnosis based on chart review and patient history. Will collect labs as below. If normal, will remove from patient problem list. Denies symptoms.  - TSH  3. Attention deficit disorder (ADD) in adult Patient is currently taking vyvanse. Patient diagnosed at primary care, has not established with psychiatry. Referral placed as below for patient to complete evaluation. Reviewed nots from McGowen MD 04/02/2023.  - Ambulatory referral to Psychiatry - ToxASSURE Select 13 (MW), Urine  4. Mixed hyperlipidemia Labs as below. Will communicate results to patient once available. Will await results to determine next steps.  Not fasting.  - Lipid panel  5. Decreased GFR Labs as below. Will communicate results to patient once available. Will await results to determine next steps.  Patient has not followed up with nephrology in the past or had further evaluation. If remains declined, will refer.  - CBC with Differential/Platelet - CMP14+EGFR  6. Thumb pain, right Not currently in pain. Discussed with patient to follow up in person at next flare of pain and we can evaluate rheumatology labs,  gout, and other processes for pain.   The above assessment and management plan was discussed with the patient. The patient verbalized understanding of and has agreed to the management plan using shared-decision making. Patient is aware to call the clinic if they develop any new symptoms or if symptoms fail to improve or worsen. Patient is aware when to return to the clinic for a follow-up visit. Patient educated on when it is appropriate to go to the emergency department.   Return in about 2 weeks (around 07/31/2023) for Chronic Condition Follow up.   Neale Burly, DNP-FNP [facility] [address] [place], [place] [address] [phone]

## 2023-07-18 LAB — CMP14+EGFR
ALT: 30 [IU]/L (ref 0–32)
AST: 33 [IU]/L (ref 0–40)
Albumin: 4.9 g/dL (ref 3.8–4.9)
Alkaline Phosphatase: 129 [IU]/L — ABNORMAL HIGH (ref 44–121)
BUN/Creatinine Ratio: 12 (ref 9–23)
BUN: 14 mg/dL (ref 6–24)
Bilirubin Total: 0.3 mg/dL (ref 0.0–1.2)
CO2: 26 mmol/L (ref 20–29)
Calcium: 10 mg/dL (ref 8.7–10.2)
Chloride: 99 mmol/L (ref 96–106)
Creatinine, Ser: 1.2 mg/dL — ABNORMAL HIGH (ref 0.57–1.00)
Globulin, Total: 2.3 g/dL (ref 1.5–4.5)
Glucose: 94 mg/dL (ref 70–99)
Potassium: 4.5 mmol/L (ref 3.5–5.2)
Sodium: 139 mmol/L (ref 134–144)
Total Protein: 7.2 g/dL (ref 6.0–8.5)
eGFR: 52 mL/min/{1.73_m2} — ABNORMAL LOW (ref >59)

## 2023-07-18 LAB — CBC WITH DIFFERENTIAL/PLATELET
Basophils Absolute: 0.1 10*3/uL (ref 0.0–0.2)
Basos: 1 %
EOS (ABSOLUTE): 0.1 10*3/uL (ref 0.0–0.4)
Eos: 1 %
Hematocrit: 42.2 % (ref 34.0–46.6)
Hemoglobin: 13.9 g/dL (ref 11.1–15.9)
Immature Grans (Abs): 0 10*3/uL (ref 0.0–0.1)
Immature Granulocytes: 0 %
Lymphocytes Absolute: 2.5 10*3/uL (ref 0.7–3.1)
Lymphs: 39 %
MCH: 31 pg (ref 26.6–33.0)
MCHC: 32.9 g/dL (ref 31.5–35.7)
MCV: 94 fL (ref 79–97)
Monocytes Absolute: 0.5 10*3/uL (ref 0.1–0.9)
Monocytes: 7 %
Neutrophils Absolute: 3.5 10*3/uL (ref 1.4–7.0)
Neutrophils: 52 %
Platelets: 264 10*3/uL (ref 150–450)
RBC: 4.48 x10E6/uL (ref 3.77–5.28)
RDW: 11.8 % (ref 11.7–15.4)
WBC: 6.6 10*3/uL (ref 3.4–10.8)

## 2023-07-18 LAB — LIPID PANEL
Chol/HDL Ratio: 2.7 {ratio} (ref 0.0–4.4)
Cholesterol, Total: 246 mg/dL — ABNORMAL HIGH (ref 100–199)
HDL: 91 mg/dL (ref >39)
LDL Chol Calc (NIH): 141 mg/dL — ABNORMAL HIGH (ref 0–99)
Triglycerides: 86 mg/dL (ref 0–149)
VLDL Cholesterol Cal: 14 mg/dL (ref 5–40)

## 2023-07-18 LAB — TSH: TSH: 2.75 u[IU]/mL (ref 0.450–4.500)

## 2023-07-21 ENCOUNTER — Encounter: Payer: Self-pay | Admitting: Family Medicine

## 2023-07-21 LAB — TOXASSURE SELECT 13 (MW), URINE

## 2023-07-22 ENCOUNTER — Encounter: Payer: Self-pay | Admitting: Family Medicine

## 2023-07-22 DIAGNOSIS — N1831 Chronic kidney disease, stage 3a: Secondary | ICD-10-CM | POA: Insufficient documentation

## 2023-07-22 IMAGING — MG MM DIGITAL SCREENING BILAT W/ TOMO AND CAD
8 series · 9 of 24 positions shown · non-contrast
Comparison: Previous exam(s).

CLINICAL DATA: Screening.

EXAM:
DIGITAL SCREENING BILATERAL MAMMOGRAM WITH TOMOSYNTHESIS AND CAD
TECHNIQUE: Bilateral screening digital craniocaudal and mediolateral oblique
mammograms were obtained. Bilateral screening digital breast
tomosynthesis was performed. The images were evaluated with
computer-aided detection.

[R MLO synth-2D]
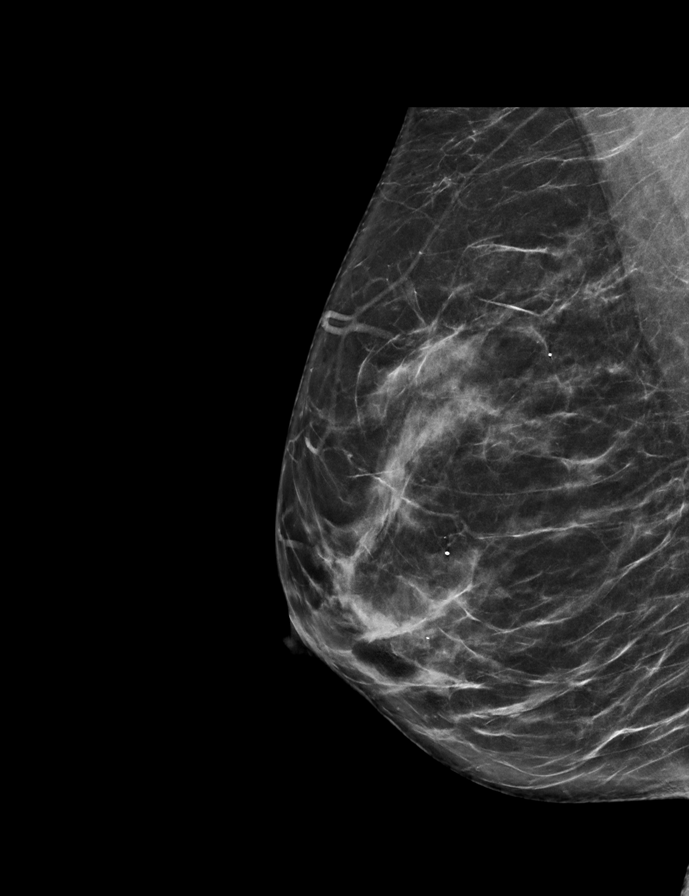

[L MLO synth-2D]
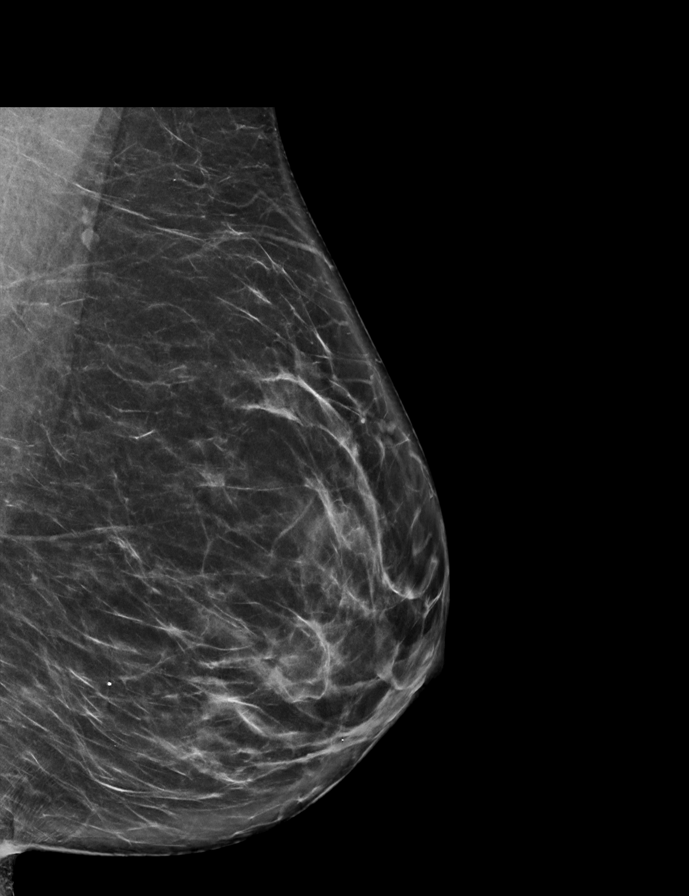

[L CC synth-2D]
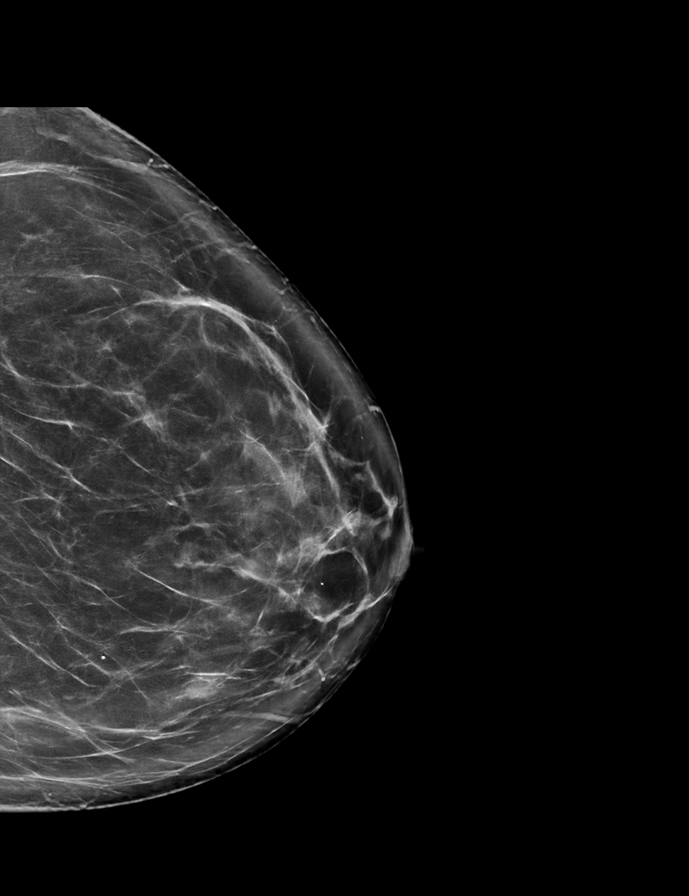

[R CC synth-2D]
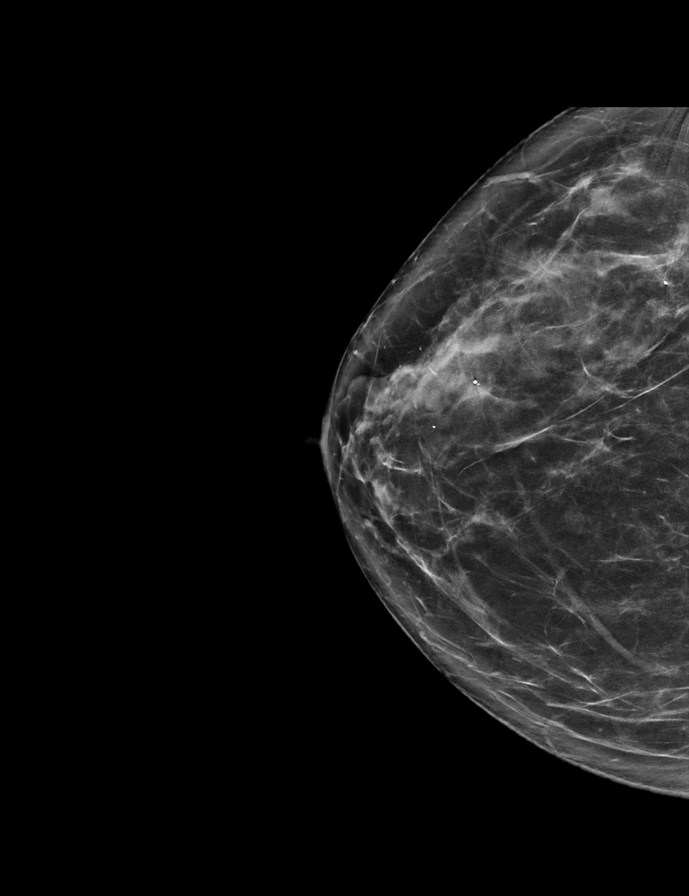

[R MLO tomo · 2 of 70 frames shown]
[frame 23/70]
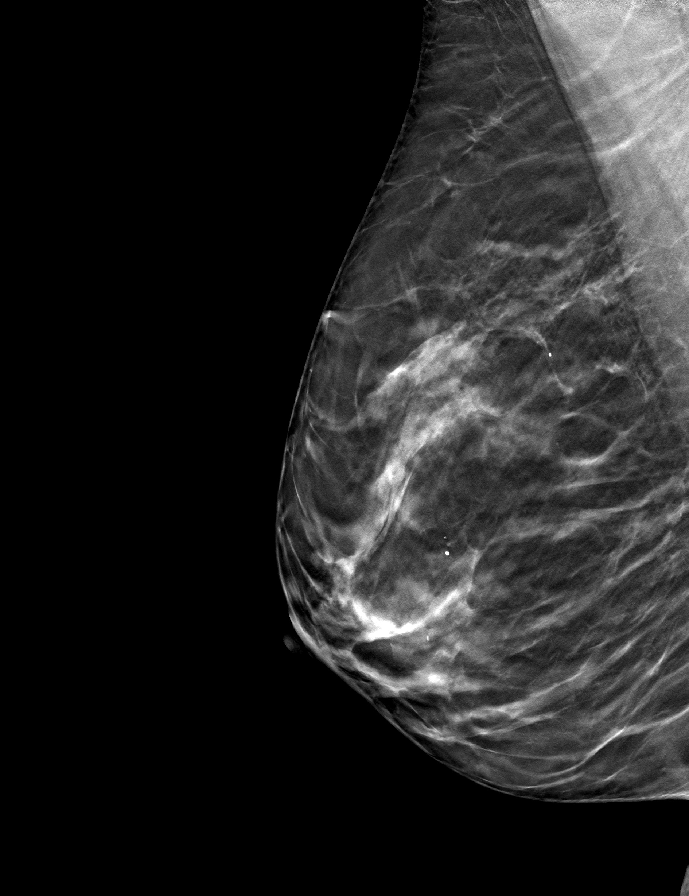
[frame 35/70]
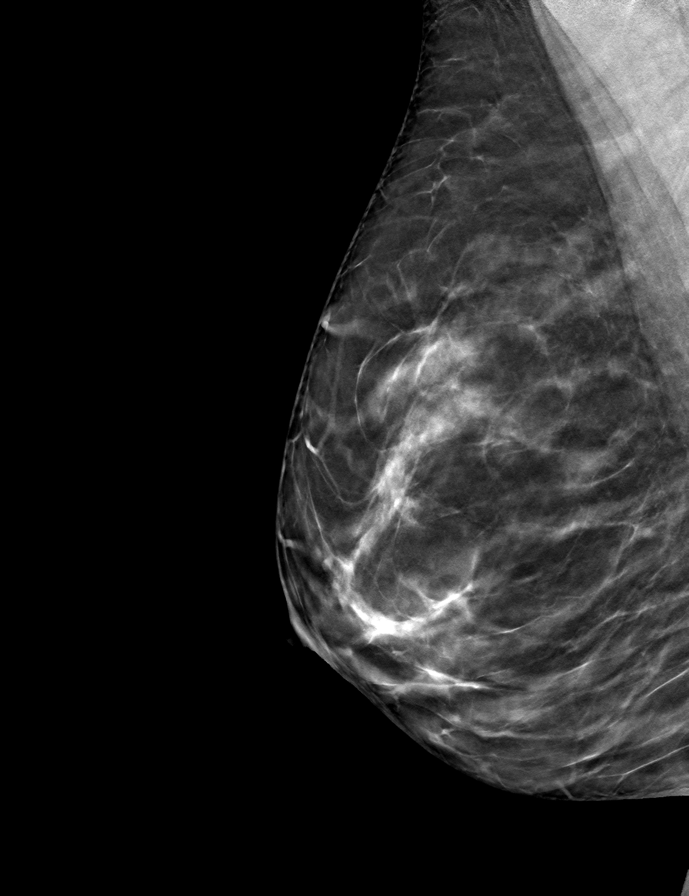

[L MLO tomo · tomo slice 39/76.0]
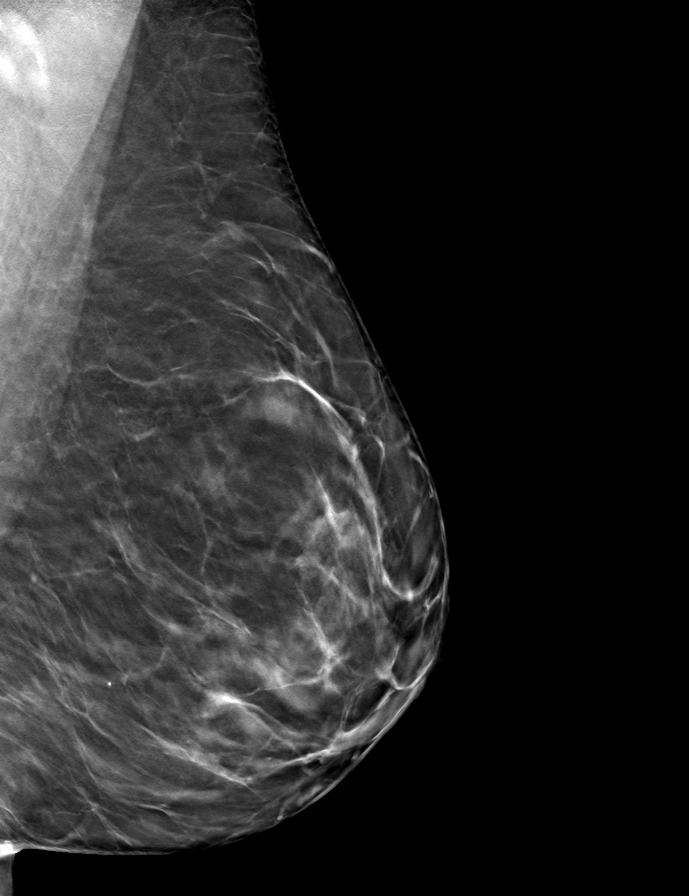

[L CC tomo · tomo slice 42/83.0]
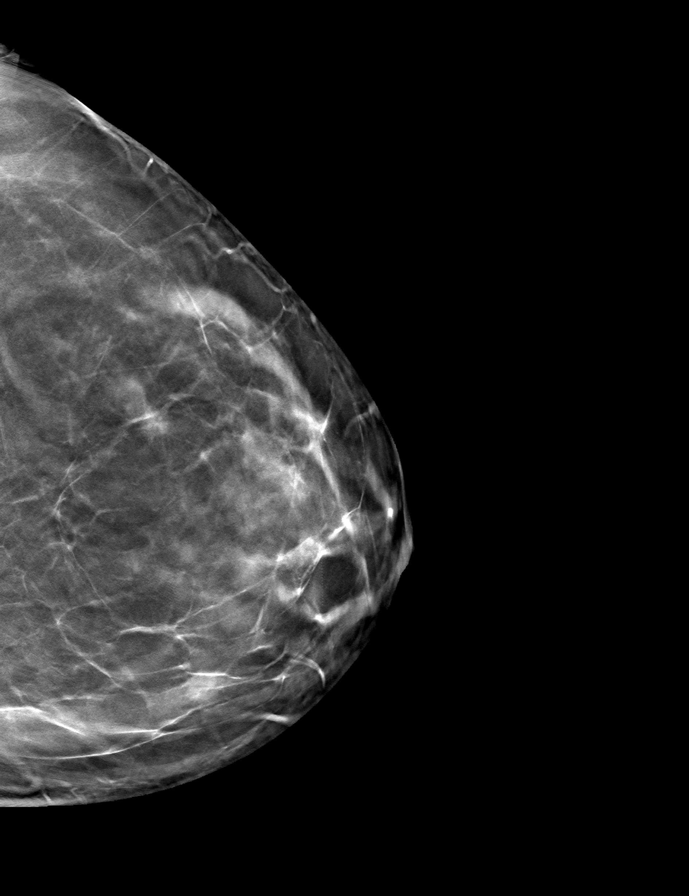

[R CC tomo · tomo slice 36/71.0]
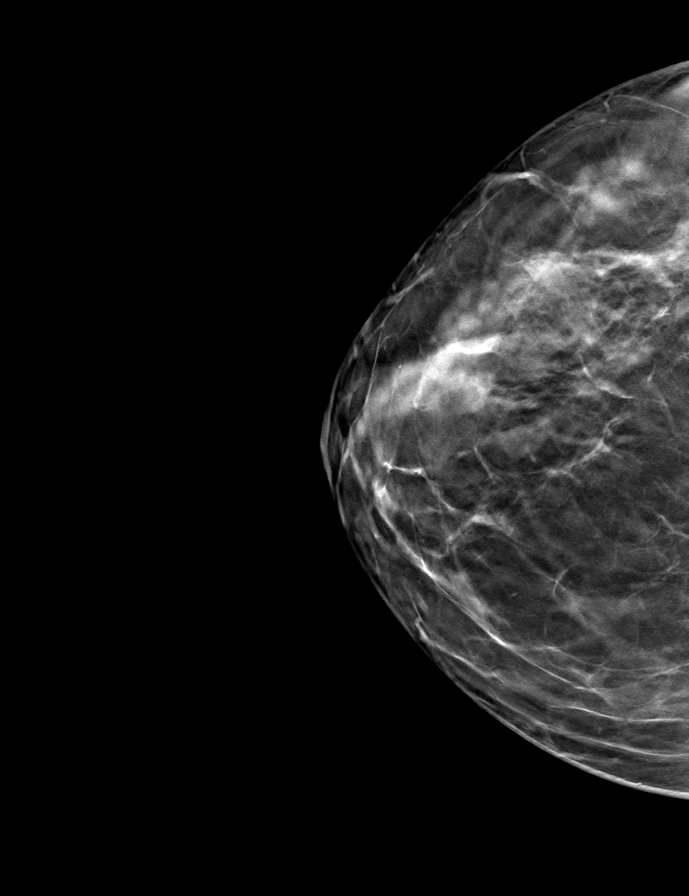

[9 of 24 positions shown; findings below may reference images not displayed]

ACR Breast Density Category c: The breast tissue is heterogeneously
dense, which may obscure small masses.
FINDINGS: In the left breast, a possible asymmetry warrants further
evaluation. In the right breast, no findings suspicious for
malignancy.
IMPRESSION: Further evaluation is suggested for possible asymmetry in the left
breast.

RECOMMENDATION:
Diagnostic mammogram and possibly ultrasound of the left breast.
(Code:8C-[id number])

The patient will be contacted regarding the findings, and additional
imaging will be scheduled.

BI-RADS CATEGORY  0: Incomplete. Need additional imaging evaluation
and/or prior mammograms for comparison.

## 2023-07-22 MED ORDER — LISINOPRIL 5 MG PO TABS: 2.5000 mg | ORAL_TABLET | Freq: Every day | ORAL | 0 refills | Status: DC

## 2023-07-22 NOTE — Addendum Note (Signed)
Addended by: Neale Burly on: 07/22/2023 02:44 PM   Modules accepted: Orders

## 2023-07-22 NOTE — Progress Notes (Signed)
Continues to have decrease GFR, meeting criteria for chronic kidney disease. I would like to start her on low dose ACEI and refer to nephrology. Slightly elevated alk phos, will repeat in 4 weeks to monitor. Slightly elevated cholesterol. Cholesterol is elevated. Diet encouraged - increase intake of fresh fruits and vegetables, increase intake of lean proteins. Bake, broil, or grill foods. Avoid fried, greasy, and fatty foods. Avoid fast foods. Increase intake of fiber-rich whole grains. Exercise encouraged - at least 150 minutes per week and advance as tolerated. Can also try red yeast rice and we will recheck in 3 months. Based on ASCVD risk score, starting statin is not recommended at this time. The 10-year ASCVD risk score (Arnett DK, et al., 2019) is: 2.4%. Toxassure as expected. Needs to follow up with psychiatry for ADD in adult. Patient approved for psychiatry with [facility] at [facility]. [address], [place] [address] [phone]

## 2023-07-23 ENCOUNTER — Encounter: Payer: Self-pay | Admitting: Family Medicine

## 2023-07-23 ENCOUNTER — Ambulatory Visit: Payer: Medicaid Other | Admitting: Family Medicine

## 2023-07-23 VITALS — BP 149/76 | HR 84 | Temp 98.2°F | Ht 71.0 in | Wt 160.0 lb

## 2023-07-23 DIAGNOSIS — F988 Other specified behavioral and emotional disorders with onset usually occurring in childhood and adolescence: Secondary | ICD-10-CM | POA: Diagnosis not present

## 2023-07-23 DIAGNOSIS — Z8542 Personal history of malignant neoplasm of other parts of uterus: Secondary | ICD-10-CM

## 2023-07-23 DIAGNOSIS — N1831 Chronic kidney disease, stage 3a: Secondary | ICD-10-CM

## 2023-07-23 DIAGNOSIS — E7800 Pure hypercholesterolemia, unspecified: Secondary | ICD-10-CM | POA: Diagnosis not present

## 2023-07-23 DIAGNOSIS — R030 Elevated blood-pressure reading, without diagnosis of hypertension: Secondary | ICD-10-CM

## 2023-07-23 MED ORDER — LISDEXAMFETAMINE DIMESYLATE 40 MG PO CAPS: 40.0000 mg | ORAL_CAPSULE | ORAL | 0 refills | Status: DC

## 2023-07-23 NOTE — Patient Instructions (Signed)
[facility] [place] [address] - [place] [address] [phone] 

## 2023-07-23 NOTE — Progress Notes (Addendum)
 Subjective:  Patient ID: Candace Allen, female    DOB: 12-Apr-1966, 58 y.o.   MRN: [id number]  Patient Care Team: Cathlene Marry Lenis, FNP as PCP - General (Family Medicine) [facility], Donell Cardinal, MD as Consulting Physician (Endocrinology) Armbruster, Elspeth SQUIBB, MD as Consulting Physician (Gastroenterology) Verlin Lonni BIRCH, MD as Consulting Physician (Cardiology) Pappayliou, Dorothyann LABOR, DO as Consulting Physician (General Surgery)   Chief Complaint:  Medical Management of Chronic Issues (2 week follow up/Review labs and tx plan)  HPI: Candace Allen is a 58 y.o. female presenting on 07/23/2023 for Medical Management of Chronic Issues (2 week follow up/Review labs and tx plan)  CKD Patient states that she was unaware of diagnosis prior. She wonders if she saw a nephrologist in the past but she is is not sure. She denies family history. States that she formerly used NSAIDs for pain in college. States that she does not use NSAIDs frequently now.   Hyperlipidemia: Patient presents with hyperlipidemia.  She was tested because screening. negative symptoms. There is not a family history of hyperlipidemia. There is not a family history of early ischemia heart disease.  ADD  Last dose will be on Saturday. 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 States that she has several projects going at one time and will struggle to complete a task before starting a new one. States that the last few months she has been better. Has been taking vyvanse  for about one year. Denies palpitations, weight loss. States that she is able to sleep well at night.   Elevated BP in office  Patient reports that she is under a lot of stress with work and diagnosis of CKD. She has a monitor at home. Monitors BP twice daily. AM averages 120s/80s. PM averages 130-140s/80s  Relevant past medical, surgical, family, and social history reviewed and updated as  indicated.  Allergies and medications reviewed and updated. Data reviewed: Chart in Epic.   Past Medical History:  Diagnosis Date   Attention deficit disorder (ADD) in adult    Chronic renal insufficiency, stage 3 (moderate) (HCC)    ?d/t dexilant   Diverticulosis 02/19/2018   By colonoscopy 2016   GERD (gastroesophageal reflux disease)    Hx of concussion    Approximately 2016   Hypothyroidism    question of-->no hx of thyroid  lab abnormalities in record   Hypothyroidism (acquired) 08/20/2019   IBS (irritable bowel syndrome)     Past Surgical History:  Procedure Laterality Date   CHOLECYSTECTOMY N/A 11/08/2021   Procedure: LAPAROSCOPIC CHOLECYSTECTOMY;  Surgeon: Evonnie Dorothyann LABOR, DO;  Location: [facility];  Service: General;  Laterality: N/A;   COLONOSCOPY  2016   2016 no polyps. Rpt at [facility] 2022 per pt, normal, no record available   ENDOMETRIAL ABLATION     SHOULDER SURGERY Left    Approximately 2016   TRANSTHORACIC ECHOCARDIOGRAM     01/2021 NORMAL   TUBAL LIGATION     UPPER GASTROINTESTINAL ENDOSCOPY  11/10/2019   neg    Social History   Socioeconomic History   Marital status: Married    Spouse name: Not on file   Number of children: 2   Years of education: Not on file   Highest education level: Associate degree: occupational, scientist, product/process development, or vocational program  Occupational History   Occupation: oceanographer  Tobacco Use   Smoking status: Never    Passive exposure: Never  Smokeless tobacco: Never  Vaping Use   Vaping status: Never Used  Substance and Sexual Activity   Alcohol use: Yes    Alcohol/week: 2.0 standard drinks of alcohol    Types: 2 Standard drinks or equivalent per week    Comment: social   Drug use: Never   Sexual activity: Yes    Birth control/protection: Surgical    Comment: Ablation  Other Topics Concern   Not on file  Social History Narrative   Married, 2 children   Educ: 12+.   Grew up in [place].  [place] area approximately  [place].   Occup: publisher->[employer]   No tob   Social Drivers of Corporate Investment Banker Strain: Low Risk  (07/17/2023)   Overall Financial Resource Strain (CARDIA)    Difficulty of Paying Living Expenses: Not hard at all  Food Insecurity: No Food Insecurity (07/17/2023)   Hunger Vital Sign    Worried About Running Out of Food in the Last Year: Never true    Ran Out of Food in the Last Year: Never true  Transportation Needs: No Transportation Needs (07/17/2023)   PRAPARE - Administrator, Civil Service (Medical): No    Lack of Transportation (Non-Medical): No  Physical Activity: Sufficiently Active (07/17/2023)   Exercise Vital Sign    Days of Exercise per Week: 5 days    Minutes of Exercise per Session: 90 min  Stress: No Stress Concern Present (07/17/2023)   [employer] of Occupational Health - Occupational Stress Questionnaire    Feeling of Stress : Only a little  Social Connections: Unknown (07/17/2023)   Social Connection and Isolation Panel [NHANES]    Frequency of Communication with Friends and Family: More than three times a week    Frequency of Social Gatherings with Friends and Family: More than three times a week    Attends Religious Services: Patient declined    Database Administrator or Organizations: No    Attends Engineer, Structural: Not on file    Marital Status: Married  Catering Manager Violence: Not At Risk (07/17/2023)   Humiliation, Afraid, Rape, and Kick questionnaire    Fear of Current or Ex-Partner: No    Emotionally Abused: No    Physically Abused: No    Sexually Abused: No    Outpatient Encounter Medications as of 07/23/2023  Medication Sig   EPINEPHrine  0.3 mg/0.3 mL IJ SOAJ injection Inject 0.3 mg into the muscle as needed for anaphylaxis.   famotidine  (PEPCID ) 40 MG tablet Take 1 tablet (40 mg total) by mouth 2 (two) times daily as needed for heartburn/reflux.   linaclotide  (LINZESS ) 290 MCG CAPS capsule  Take 1 capsule (290 mcg total) by mouth daily.   lisdexamfetamine (VYVANSE ) 40 MG capsule Take 1 capsule (40 mg total) by mouth every morning.   lisinopril  (ZESTRIL ) 5 MG tablet Take 0.5 tablets (2.5 mg total) by mouth daily.   loratadine  (CLARITIN ) 10 MG tablet Take 10 mg by mouth daily.   Multiple Vitamins-Minerals (ADULT GUMMY) CHEW Chew 1 capsule by mouth daily.   ondansetron  (ZOFRAN -ODT) 8 MG disintegrating tablet Take 8 mg by mouth every 8 (eight) hours as needed.   venlafaxine  XR (EFFEXOR  XR) 37.5 MG 24 hr capsule Take 1 capsule (37.5 mg total) by mouth daily with breakfast.   VITAMIN D-VITAMIN K  PO Take 1 capsule by mouth daily.   Facility-Administered Encounter Medications as of 07/23/2023  Medication   dextrose  5 % solution  Allergies  Allergen Reactions   Bee Venom Anaphylaxis   Flagyl  [Metronidazole ] Anaphylaxis   Iodine Anaphylaxis   Shellfish Allergy Anaphylaxis   Tramadol  Hcl Other (See Comments)    Showed signs of stroke   Wasp Venom Anaphylaxis   Gluten Meal     Bloating    Lactose Intolerance (Gi) Hives    Bloating,    Melatonin     Nightmares     Review of Systems As per HPI  Objective:  BP (!) 149/76   Pulse 84   Temp 98.2 F (36.8 C)   Ht 5' 11 (1.803 m)   Wt 160 lb (72.6 kg)   LMP 02/19/2006 (Approximate)   SpO2 98%   BMI 22.32 kg/m    Wt Readings from Last 3 Encounters:  07/23/23 160 lb (72.6 kg)  07/17/23 158 lb (71.7 kg)  04/02/23 164 lb 12.8 oz (74.8 kg)    Physical Exam Constitutional:      General: She is awake. She is not in acute distress.    Appearance: Normal appearance. She is well-developed and well-groomed. She is not ill-appearing, toxic-appearing or diaphoretic.  Cardiovascular:     Rate and Rhythm: Normal rate and regular rhythm.     Pulses: Normal pulses.          Radial pulses are 2+ on the right side and 2+ on the left side.       Posterior tibial pulses are 2+ on the right side and 2+ on the left side.     Heart  sounds: Normal heart sounds. No murmur heard.    No gallop.  Pulmonary:     Effort: Pulmonary effort is normal. No respiratory distress.     Breath sounds: Normal breath sounds. No stridor. No wheezing, rhonchi or rales.  Musculoskeletal:     Cervical back: Full passive range of motion without pain and neck supple.     Right lower leg: No edema.     Left lower leg: No edema.  Skin:    General: Skin is warm.     Capillary Refill: Capillary refill takes less than 2 seconds.  Neurological:     General: No focal deficit present.     Mental Status: She is alert, oriented to person, place, and time and easily aroused. Mental status is at baseline.     GCS: GCS eye subscore is 4. GCS verbal subscore is 5. GCS motor subscore is 6.     Motor: No weakness.  Psychiatric:        Attention and Perception: Attention and perception normal.        Mood and Affect: Mood is anxious. Affect is tearful.        Speech: Speech normal.        Behavior: Behavior normal. Behavior is cooperative.        Thought Content: Thought content normal. Thought content does not include homicidal or suicidal ideation. Thought content does not include homicidal or suicidal plan.        Cognition and Memory: Cognition and memory normal.        Judgment: Judgment normal.     Results for orders placed or performed in visit on 07/17/23  CBC with Differential/Platelet   Collection Time: 07/17/23  1:52 PM  Result Value Ref Range   WBC 6.6 3.4 - 10.8 x10E3/uL   RBC 4.48 3.77 - 5.28 x10E6/uL   Hemoglobin 13.9 11.1 - 15.9 g/dL   Hematocrit 57.7 65.9 - 46.6 %  MCV 94 79 - 97 fL   MCH 31.0 26.6 - 33.0 pg   MCHC 32.9 31.5 - 35.7 g/dL   RDW 88.1 88.2 - 84.5 %   Platelets 264 150 - 450 x10E3/uL   Neutrophils 52 Not Estab. %   Lymphs 39 Not Estab. %   Monocytes 7 Not Estab. %   Eos 1 Not Estab. %   Basos 1 Not Estab. %   Neutrophils Absolute 3.5 1.4 - 7.0 x10E3/uL   Lymphocytes Absolute 2.5 0.7 - 3.1 x10E3/uL    Monocytes Absolute 0.5 0.1 - 0.9 x10E3/uL   EOS (ABSOLUTE) 0.1 0.0 - 0.4 x10E3/uL   Basophils Absolute 0.1 0.0 - 0.2 x10E3/uL   Immature Granulocytes 0 Not Estab. %   Immature Grans (Abs) 0.0 0.0 - 0.1 x10E3/uL  CMP14+EGFR   Collection Time: 07/17/23  1:52 PM  Result Value Ref Range   Glucose 94 70 - 99 mg/dL   BUN 14 6 - 24 mg/dL   Creatinine, Ser 8.79 (H) 0.57 - 1.00 mg/dL   eGFR 52 (L) >40 fO/fpw/8.26   BUN/Creatinine Ratio 12 9 - 23   Sodium 139 134 - 144 mmol/L   Potassium 4.5 3.5 - 5.2 mmol/L   Chloride 99 96 - 106 mmol/L   CO2 26 20 - 29 mmol/L   Calcium 10.0 8.7 - 10.2 mg/dL   Total Protein 7.2 6.0 - 8.5 g/dL   Albumin 4.9 3.8 - 4.9 g/dL   Globulin, Total 2.3 1.5 - 4.5 g/dL   Bilirubin Total 0.3 0.0 - 1.2 mg/dL   Alkaline Phosphatase 129 (H) 44 - 121 IU/L   AST 33 0 - 40 IU/L   ALT 30 0 - 32 IU/L  Lipid panel   Collection Time: 07/17/23  1:52 PM  Result Value Ref Range   Cholesterol, Total 246 (H) 100 - 199 mg/dL   Triglycerides 86 0 - 149 mg/dL   HDL 91 >60 mg/dL   VLDL Cholesterol Cal 14 5 - 40 mg/dL   LDL Chol Calc (NIH) 858 (H) 0 - 99 mg/dL   Chol/HDL Ratio 2.7 0.0 - 4.4 ratio  TSH   Collection Time: 07/17/23  1:52 PM  Result Value Ref Range   TSH 2.750 0.450 - 4.500 uIU/mL  ToxASSURE Select 13 (MW), Urine   Collection Time: 07/17/23  1:57 PM  Result Value Ref Range   Summary FINAL        07/23/2023    1:32 PM 07/17/2023    1:10 PM 12/31/2022    8:28 AM 10/31/2022    8:45 AM 06/24/2022    9:17 AM  Depression screen PHQ 2/9  Decreased Interest 0 0 0 0 0  Down, Depressed, Hopeless 0 0 0 0 1  PHQ - 2 Score 0 0 0 0 1  Altered sleeping  0  0 1  Tired, decreased energy  0  0 0  Change in appetite  0  0 0  Feeling bad or failure about yourself   0  0 0  Trouble concentrating  0  0 3  Moving slowly or fidgety/restless  0  0 3  Suicidal thoughts  0  0 0  PHQ-9 Score  0  0 8  Difficult doing work/chores  Not difficult at all   Extremely dIfficult        07/23/2023    1:33 PM 07/17/2023    1:10 PM 12/31/2022    8:28 AM 06/24/2022    9:17 AM  GAD 7 :  Generalized Anxiety Score  Nervous, Anxious, on Edge 0 0 0 0  Control/stop worrying 0 0 0 0  Worry too much - different things 0 0 0 0  Trouble relaxing 0 0 0 3  Restless 0 0 0 3  Easily annoyed or irritable 0 0 0 0  Afraid - awful might happen 0 0 0 0  Total GAD 7 Score 0 0 0 6  Anxiety Difficulty Not difficult at all Not difficult at all Not difficult at all Extremely difficult   Pertinent labs & imaging results that were available during my care of the patient were reviewed by me and considered in my medical decision making.  Assessment & Plan:  Candace Allen was seen today for medical management of chronic issues.  Diagnoses and all orders for this visit:  1. Stage 3a chronic kidney disease (HCC) (Primary) Patient referred to nephrology. Patient to avoid Nsaids and other nephrotoxic medications. Will collect labs as below. Started low dose ACEi at last appt. Reviewed imaging from 12/05/2015 - patient found to have cortical thinning, worse on the left than the right.  - Microalbumin / creatinine urine ratio  2. Attention deficit disorder (ADD) in adult Patient previously referred to psychiatry for evaluation. Provided patient with information for follow up. Will bridge patient as below. Reviewed toxassure from last appt and PDMP. No red flags. Did not complete CSA at this time as we are waiting for psychiatry eval.  - lisdexamfetamine (VYVANSE ) 40 MG capsule; Take 1 capsule (40 mg total) by mouth every morning.  Dispense: 30 capsule; Refill: 0  3. History of endometrial cancer ?SABRA Referral placed as below for patient to follow up. Reviewed notes from prior ob and endocrinology. Per notes, from 09/13/21, [facility], MD unsure if patient had endometrial cancer or fibroids.  - Ambulatory referral to Obstetrics / Gynecology  4. Pure hypercholesterolemia Discussed ascvd risk score. Encouraged RYR. Will  continue to monitor on future labs.   5. Elevated blood pressure reading Elevated BP in office. Patient expressed anxiety over medical diagnosis. Recommend that patient continue to monitor BP at home. Low dose ACEi started for CKD, can increase if patient remains hypertensive.   Continue all other maintenance medications.  Follow up plan: Return in about 2 months (around 09/20/2023) for Chronic Condition Follow up.   Continue healthy lifestyle choices, including diet (rich in fruits, vegetables, and lean proteins, and low in salt and simple carbohydrates) and exercise (at least 30 minutes of moderate physical activity daily).  Written and verbal instructions provided   The above assessment and management plan was discussed with the patient. The patient verbalized understanding of and has agreed to the management plan. Patient is aware to call the clinic if they develop any new symptoms or if symptoms persist or worsen. Patient is aware when to return to the clinic for a follow-up visit. Patient educated on when it is appropriate to go to the emergency department.   Marry Kins, DNP-FNP [facility] [address] [place], [place] [address] [phone]

## 2023-07-24 ENCOUNTER — Encounter: Payer: Self-pay | Admitting: Family Medicine

## 2023-07-24 DIAGNOSIS — F988 Other specified behavioral and emotional disorders with onset usually occurring in childhood and adolescence: Secondary | ICD-10-CM

## 2023-07-24 LAB — MICROALBUMIN / CREATININE URINE RATIO
Creatinine, Urine: 31.7 mg/dL
Microalb/Creat Ratio: <9 mg/g{creat} (ref 0–29)
Microalbumin, Urine: <3 ug/mL

## 2023-07-24 NOTE — Progress Notes (Signed)
 Looks good

## 2023-08-20 ENCOUNTER — Telehealth: Payer: Self-pay

## 2023-08-20 NOTE — Telephone Encounter (Signed)
 Copied from CRM [id number]. Topic: Clinical - Prescription Issue >> Aug 20, 2023  3:58 PM Everette C wrote: Reason for CRM: The patient has called to follow up on continued discussions regarding a possible early refill of their lisdexamfetamine (VYVANSE) 40 MG capsule [[id number]] prescription   Please contact the patient further when possible

## 2023-08-21 MED ORDER — LISDEXAMFETAMINE DIMESYLATE 40 MG PO CAPS: 40.0000 mg | ORAL_CAPSULE | ORAL | 0 refills | Status: DC

## 2023-08-29 ENCOUNTER — Encounter: Payer: Self-pay | Admitting: Family Medicine

## 2023-09-02 ENCOUNTER — Encounter: Payer: Self-pay | Admitting: Adult Health

## 2023-09-02 ENCOUNTER — Ambulatory Visit: Payer: Medicaid Other | Admitting: Adult Health

## 2023-09-02 ENCOUNTER — Other Ambulatory Visit (HOSPITAL_COMMUNITY)
Admission: RE | Admit: 2023-09-02 | Discharge: 2023-09-02 | Disposition: A | Discharge status: Home / Self Care | Source: Ambulatory Visit | Attending: Adult Health | Admitting: Adult Health

## 2023-09-02 ENCOUNTER — Telehealth: Payer: Self-pay | Admitting: Family Medicine

## 2023-09-02 ENCOUNTER — Other Ambulatory Visit: Payer: Self-pay | Admitting: Adult Health

## 2023-09-02 VITALS — BP 126/70 | HR 80 | Ht 70.0 in | Wt 159.0 lb

## 2023-09-02 DIAGNOSIS — N270 Small kidney, unilateral: Secondary | ICD-10-CM | POA: Diagnosis not present

## 2023-09-02 DIAGNOSIS — N1831 Chronic kidney disease, stage 3a: Secondary | ICD-10-CM | POA: Diagnosis not present

## 2023-09-02 DIAGNOSIS — F909 Attention-deficit hyperactivity disorder, unspecified type: Secondary | ICD-10-CM | POA: Diagnosis not present

## 2023-09-02 DIAGNOSIS — Z0000 Encounter for general adult medical examination without abnormal findings: Secondary | ICD-10-CM | POA: Insufficient documentation

## 2023-09-02 DIAGNOSIS — K219 Gastro-esophageal reflux disease without esophagitis: Secondary | ICD-10-CM | POA: Diagnosis not present

## 2023-09-02 DIAGNOSIS — J3089 Other allergic rhinitis: Secondary | ICD-10-CM | POA: Insufficient documentation

## 2023-09-02 DIAGNOSIS — Z1211 Encounter for screening for malignant neoplasm of colon: Secondary | ICD-10-CM | POA: Diagnosis not present

## 2023-09-02 DIAGNOSIS — Z1331 Encounter for screening for depression: Secondary | ICD-10-CM

## 2023-09-02 LAB — HEMOCCULT GUIAC POC 1CARD (OFFICE): Fecal Occult Blood, POC: NEGATIVE

## 2023-09-02 MED ORDER — LEVOCETIRIZINE DIHYDROCHLORIDE 5 MG PO TABS: 5.0000 mg | ORAL_TABLET | Freq: Every evening | ORAL | 1 refills | Status: DC

## 2023-09-02 NOTE — Telephone Encounter (Signed)
 Reaching out to referrals coordinator about nephrology referral.

## 2023-09-02 NOTE — Telephone Encounter (Signed)
 This would be Patient's responsibility, this is why we developed the questionnaire as to where Patient would like to be seen. Within [facility] most all insurance's are accepted so that is where we will send, if outside of [facility] Patient may choose based off insurance preference.

## 2023-09-02 NOTE — Progress Notes (Signed)
 Patient ID: Candace Allen, female   DOB: 1966/02/19, 58 y.o.   MRN: [id number] History of Present Illness: [name] is a 58 year old white female, married,PM in for well woman gyn exam and pap. She is a new pt. She is active,likes to be outside. She owns Photographer.   PCP is Neale Burly FNP    Current Medications, Allergies, Past Medical History, Past Surgical History, Family History and Social History were reviewed in [employer] record.     Review of Systems: Patient denies any headaches, hearing loss, fatigue, blurred vision, shortness of breath, chest pain, abdominal pain, problems with bowel movements(has IBS, C/D), urination, or intercourse. No joint pain or mood swings.  Denies any vaginal bleeding  Had ablation in past  In chart ?endometrial cancer listed??? +allergies  Hx GERD  Physical Exam:BP 126/70 (BP Location: Left Arm, Patient Position: Sitting, Cuff Size: Normal)   Pulse 80   Ht 5\' 10"  (1.778 m)   Wt 159 lb (72.1 kg)   LMP 02/19/2006 (Approximate)   BMI 22.81 kg/m   General:  Well developed, well nourished, no acute distress Skin:  Warm and dry Neck:  Midline trachea, normal thyroid, good ROM, no lymphadenopathy Lungs; Clear to auscultation bilaterally Breast:  No dominant palpable mass, retraction, or nipple discharge Cardiovascular: Regular rate and rhythm Abdomen:  Soft, non tender, no hepatosplenomegaly Pelvic:  External genitalia is normal in appearance, no lesions.  The vagina is normal in appearance. Urethra has no lesions or masses. The cervix is smooth, stenotic at os, pap with HR HPV genotyping performed.  Uterus is felt to be normal size, shape, and contour.  No adnexal masses or tenderness noted.Bladder is non tender, no masses felt. Rectal: Good sphincter tone, no polyps, or hemorrhoids felt.  Hemoccult negative. Extremities/musculoskeletal:  No swelling or varicosities noted, no clubbing or cyanosis Psych:  No mood  changes, alert and cooperative,seems happy AA is 2 Fall risk is low    09/02/2023    9:17 AM 07/23/2023    1:32 PM 07/17/2023    1:10 PM  Depression screen PHQ 2/9  Decreased Interest 0 0 0  Down, Depressed, Hopeless 0 0 0  PHQ - 2 Score 0 0 0  Altered sleeping 0  0  Tired, decreased energy 0  0  Change in appetite 0  0  Feeling bad or failure about yourself  0  0  Trouble concentrating 1  0  Moving slowly or fidgety/restless 0  0  Suicidal thoughts 0  0  PHQ-9 Score 1  0  Difficult doing work/chores   Not difficult at all       09/02/2023    9:17 AM 07/23/2023    1:33 PM 07/17/2023    1:10 PM 12/31/2022    8:28 AM  GAD 7 : Generalized Anxiety Score  Nervous, Anxious, on Edge 0 0 0 0  Control/stop worrying 0 0 0 0  Worry too much - different things 0 0 0 0  Trouble relaxing 0 0 0 0  Restless 0 0 0 0  Easily annoyed or irritable 0 0 0 0  Afraid - awful might happen 0 0 0 0  Total GAD 7 Score 0 0 0 0  Anxiety Difficulty  Not difficult at all Not difficult at all Not difficult at all      Upstream - 09/02/23 0931       Pregnancy Intention Screening   Does the patient want to become pregnant in  the next year? No    Does the patient's partner want to become pregnant in the next year? No    Would the patient like to discuss contraceptive options today? No      Contraception Wrap Up   Current Method Female Sterilization    End Method Female Sterilization    Contraception Counseling Provided No            Co exam with Marylynn Pearson NP student   Impression and plan: 1. Routine general medical examination at a health care facility (Primary) Pap sent Pap in 3 years if negative Physical in 1 year Getting mammogram next week on mobile bus Labs with PCP Colonoscopy per GI Stay active  - Cytology - PAP( [place]) Request records from [facility]   2. Encounter for screening fecal occult blood testing Hemoccult was negative  - POCT occult blood stool  3.  Environmental and seasonal allergies Has tried Claritin, Careers adviser and other OTC without relief Try xyzal Meds ordered this encounter  Medications   levocetirizine (XYZAL) 5 MG tablet    Sig: Take 1 tablet (5 mg total) by mouth every evening.    Dispense:  30 tablet    Refill:  1    Supervising Provider:   Duane Lope H [[id number]]

## 2023-09-03 ENCOUNTER — Ambulatory Visit: Admitting: Family Medicine

## 2023-09-03 ENCOUNTER — Encounter: Payer: Self-pay | Admitting: Family Medicine

## 2023-09-03 VITALS — BP 101/68 | HR 86 | Temp 98.2°F | Ht 70.0 in | Wt 157.0 lb

## 2023-09-03 DIAGNOSIS — R052 Subacute cough: Secondary | ICD-10-CM

## 2023-09-03 DIAGNOSIS — J302 Other seasonal allergic rhinitis: Secondary | ICD-10-CM | POA: Diagnosis not present

## 2023-09-03 DIAGNOSIS — M5432 Sciatica, left side: Secondary | ICD-10-CM | POA: Diagnosis not present

## 2023-09-03 MED ORDER — PROMETHAZINE-DM 6.25-15 MG/5ML PO SYRP: 5.0000 mL | ORAL_SOLUTION | Freq: Four times a day (QID) | ORAL | 0 refills | Status: DC | PRN

## 2023-09-03 MED ORDER — METHYLPREDNISOLONE ACETATE 40 MG/ML IJ SUSP
40.0000 mg | Freq: Once | INTRAMUSCULAR | Status: AC
  Administered 2023-09-03: 40 mg via INTRAMUSCULAR

## 2023-09-03 MED ORDER — MONTELUKAST SODIUM 10 MG PO TABS: 10.0000 mg | ORAL_TABLET | Freq: Every day | ORAL | 3 refills | Status: DC

## 2023-09-03 NOTE — Progress Notes (Signed)
 Subjective:  Patient ID: Candace Allen, female    DOB: Sep 20, 1965, 58 y.o.   MRN: [id number]  Patient Care Team: Arrie Senate, FNP as PCP - General (Family Medicine) [facility], Konrad Dolores, MD as Consulting Physician (Endocrinology) Armbruster, Willaim Rayas, MD as Consulting Physician (Gastroenterology) Kathleene Hazel, MD as Consulting Physician (Cardiology) Pappayliou, Gustavus Messing, DO as Consulting Physician (General Surgery)   Chief Complaint:  Allergies (Nothing is helping)  HPI: Candace Allen is a 58 y.o. female presenting on 09/03/2023 for Allergies (Nothing is helping)  States that she has worsening allergies.  She traveled to [place] in December and states that since then she has had worsening drainage, cough, rhinorrhea, watery eyes. 2 weeks has really worsened and is causing her to not sleep due to congestion. She is using cough drops. Started taking xyzal last night.   Leg pain  Started over one month ago. Pulled muscle at gym and it is not getting better. Saw a chiropractor, but her pain has not improved. She is not taking NSAIDs due to renal function. She states that she has radiation of pain down the back of her left leg. States that the pain sometimes is "burning".   Relevant past medical, surgical, family, and social history reviewed and updated as indicated.  Allergies and medications reviewed and updated. Data reviewed: Chart in Epic.   Past Medical History:  Diagnosis Date   Attention deficit disorder (ADD) in adult    Chronic renal insufficiency, stage 3 (moderate) (HCC)    ?d/t dexilant   Diverticulosis 02/19/2018   By colonoscopy 2016   Endometrial cancer (HCC)    GERD (gastroesophageal reflux disease)    Hx of concussion    Approximately 2016   Hypothyroidism    question of-->no hx of thyroid lab abnormalities in record   Hypothyroidism (acquired) 08/20/2019   IBS (irritable bowel syndrome)     Past Surgical History:   Procedure Laterality Date   CHOLECYSTECTOMY N/A 11/08/2021   Procedure: LAPAROSCOPIC CHOLECYSTECTOMY;  Surgeon: Lewie Chamber, DO;  Location: [facility];  Service: General;  Laterality: N/A;   COLONOSCOPY  2016   2016 no polyps. Rpt at [facility] 2022 per pt, normal, no record available   ENDOMETRIAL ABLATION     SHOULDER SURGERY Left    Approximately 2016   TRANSTHORACIC ECHOCARDIOGRAM     01/2021 NORMAL   TUBAL LIGATION     UPPER GASTROINTESTINAL ENDOSCOPY  11/10/2019   neg    Social History   Socioeconomic History   Marital status: Married    Spouse name: Not on file   Number of children: 2   Years of education: Not on file   Highest education level: Associate degree: occupational, Scientist, product/process development, or vocational program  Occupational History   Occupation: Oceanographer  Tobacco Use   Smoking status: Never    Passive exposure: Never   Smokeless tobacco: Never  Vaping Use   Vaping status: Never Used  Substance and Sexual Activity   Alcohol use: Yes    Alcohol/week: 2.0 standard drinks of alcohol    Types: 2 Standard drinks or equivalent per week    Comment: social   Drug use: Never   Sexual activity: Yes    Birth control/protection: Surgical    Comment: tubal & ablation  Other Topics Concern   Not on file  Social History Narrative   Married, 2 children   Educ: 12+.   Grew up in [place].  [place] area approximately [place].  Occup: publisher->[employer]   No tob   Social Drivers of Corporate investment banker Strain: Low Risk  (09/02/2023)   Overall Financial Resource Strain (CARDIA)    Difficulty of Paying Living Expenses: Not very hard  Food Insecurity: No Food Insecurity (09/02/2023)   Hunger Vital Sign    Worried About Running Out of Food in the Last Year: Never true    Ran Out of Food in the Last Year: Never true  Transportation Needs: No Transportation Needs (09/02/2023)   PRAPARE - Administrator, Civil Service (Medical): No    Lack  of Transportation (Non-Medical): No  Physical Activity: Sufficiently Active (09/02/2023)   Exercise Vital Sign    Days of Exercise per Week: 5 days    Minutes of Exercise per Session: 90 min  Stress: Stress Concern Present (09/02/2023)   [employer] of Occupational Health - Occupational Stress Questionnaire    Feeling of Stress : To some extent  Social Connections: Moderately Integrated (09/02/2023)   Social Connection and Isolation Panel [NHANES]    Frequency of Communication with Friends and Family: More than three times a week    Frequency of Social Gatherings with Friends and Family: Once a week    Attends Religious Services: Never    Database administrator or Organizations: Yes    Attends Engineer, structural: More than 4 times per year    Marital Status: Married  Catering manager Violence: Not At Risk (09/02/2023)   Humiliation, Afraid, Rape, and Kick questionnaire    Fear of Current or Ex-Partner: No    Emotionally Abused: No    Physically Abused: No    Sexually Abused: No    Outpatient Encounter Medications as of 09/03/2023  Medication Sig   EPINEPHrine 0.3 mg/0.3 mL IJ SOAJ injection Inject 0.3 mg into the muscle as needed for anaphylaxis.   famotidine (PEPCID) 40 MG tablet Take 1 tablet (40 mg total) by mouth 2 (two) times daily as needed for heartburn/reflux.   levocetirizine (XYZAL) 5 MG tablet Take 1 tablet (5 mg total) by mouth every evening.   linaclotide (LINZESS) 290 MCG CAPS capsule Take 1 capsule (290 mcg total) by mouth daily. (Patient taking differently: Take 290 mcg by mouth as needed.)   lisdexamfetamine (VYVANSE) 40 MG capsule Take 1 capsule (40 mg total) by mouth every morning.   lisinopril (ZESTRIL) 5 MG tablet Take 0.5 tablets (2.5 mg total) by mouth daily.   Multiple Vitamins-Minerals (ADULT GUMMY) CHEW Chew 1 capsule by mouth daily.   ondansetron (ZOFRAN-ODT) 8 MG disintegrating tablet Take 8 mg by mouth every 8 (eight) hours as needed.    UNABLE TO FIND Asparagus drops-daily in water; Liver Life-10 drops daily   venlafaxine XR (EFFEXOR XR) 37.5 MG 24 hr capsule Take 1 capsule (37.5 mg total) by mouth daily with breakfast.   VITAMIN D-VITAMIN K PO Take 1 capsule by mouth daily.   Facility-Administered Encounter Medications as of 09/03/2023  Medication   dextrose 5 % solution    Allergies  Allergen Reactions   Bee Venom Anaphylaxis   Flagyl [Metronidazole] Anaphylaxis   Iodine Anaphylaxis   Shellfish Allergy Anaphylaxis   Tramadol Hcl Other (See Comments)    Showed signs of stroke   Wasp Venom Anaphylaxis   Gluten Meal     Bloating    Lactose Intolerance (Gi) Hives    Bloating,    Melatonin     Nightmares     Review of Systems  As per HPI  Objective:  BP 101/68   Pulse 86   Temp 98.2 F (36.8 C)   Ht 5\' 10"  (1.778 m)   Wt 157 lb (71.2 kg)   LMP 02/19/2006 (Approximate)   SpO2 99%   BMI 22.53 kg/m    Wt Readings from Last 3 Encounters:  09/03/23 157 lb (71.2 kg)  09/02/23 159 lb (72.1 kg)  07/23/23 160 lb (72.6 kg)    Physical Exam Constitutional:      General: She is awake. She is not in acute distress.    Appearance: Normal appearance. She is well-developed and well-groomed. She is not ill-appearing, toxic-appearing or diaphoretic.  HENT:     Right Ear: No drainage, swelling or tenderness. A middle ear effusion is present. There is no impacted cerumen. No foreign body. No mastoid tenderness. No PE tube. No hemotympanum. Tympanic membrane is not injected, scarred, perforated, erythematous, retracted or bulging.     Left Ear: No drainage, swelling or tenderness. A middle ear effusion is present. There is no impacted cerumen. No foreign body. No mastoid tenderness. No PE tube. No hemotympanum. Tympanic membrane is not injected, scarred, perforated, erythematous, retracted or bulging.     Nose: Congestion and rhinorrhea present. Rhinorrhea is clear.     Right Sinus: Maxillary sinus tenderness and frontal  sinus tenderness present.     Left Sinus: Maxillary sinus tenderness and frontal sinus tenderness present.     Mouth/Throat:     Lips: Pink. No lesions.     Palate: No mass.     Pharynx: Posterior oropharyngeal erythema present. No pharyngeal swelling or postnasal drip.  Eyes:     Comments: watering  Cardiovascular:     Rate and Rhythm: Normal rate and regular rhythm.     Pulses: Normal pulses.          Radial pulses are 2+ on the right side and 2+ on the left side.       Posterior tibial pulses are 2+ on the right side and 2+ on the left side.     Heart sounds: Normal heart sounds. No murmur heard.    No gallop.  Pulmonary:     Effort: Pulmonary effort is normal. No respiratory distress.     Breath sounds: Normal breath sounds. No stridor. No wheezing, rhonchi or rales.  Musculoskeletal:     Cervical back: Full passive range of motion without pain and neck supple.     Left upper leg: No swelling, edema, deformity, lacerations, tenderness or bony tenderness.     Right lower leg: No edema.     Left lower leg: No deformity, lacerations, tenderness or bony tenderness. No edema.     Comments: ROM intact, limited by pain   Skin:    General: Skin is warm.     Capillary Refill: Capillary refill takes less than 2 seconds.  Neurological:     General: No focal deficit present.     Mental Status: She is alert, oriented to person, place, and time and easily aroused. Mental status is at baseline.     GCS: GCS eye subscore is 4. GCS verbal subscore is 5. GCS motor subscore is 6.     Motor: No weakness.  Psychiatric:        Attention and Perception: Attention and perception normal.        Mood and Affect: Mood and affect normal.        Speech: Speech normal.        Behavior:  Behavior normal. Behavior is cooperative.        Thought Content: Thought content normal. Thought content does not include homicidal or suicidal ideation. Thought content does not include homicidal or suicidal plan.         Cognition and Memory: Cognition and memory normal.        Judgment: Judgment normal.     Results for orders placed or performed in visit on 09/02/23  POCT occult blood stool   Collection Time: 09/02/23 10:21 AM  Result Value Ref Range   Fecal Occult Blood, POC Negative Negative   Card #1 Date     Card #2 Fecal Occult Blod, POC     Card #2 Date     Card #3 Fecal Occult Blood, POC     Card #3 Date         09/02/2023    9:17 AM 07/23/2023    1:32 PM 07/17/2023    1:10 PM 12/31/2022    8:28 AM 10/31/2022    8:45 AM  Depression screen PHQ 2/9  Decreased Interest 0 0 0 0 0  Down, Depressed, Hopeless 0 0 0 0 0  PHQ - 2 Score 0 0 0 0 0  Altered sleeping 0  0  0  Tired, decreased energy 0  0  0  Change in appetite 0  0  0  Feeling bad or failure about yourself  0  0  0  Trouble concentrating 1  0  0  Moving slowly or fidgety/restless 0  0  0  Suicidal thoughts 0  0  0  PHQ-9 Score 1  0  0  Difficult doing work/chores   Not difficult at all         09/02/2023    9:17 AM 07/23/2023    1:33 PM 07/17/2023    1:10 PM 12/31/2022    8:28 AM  GAD 7 : Generalized Anxiety Score  Nervous, Anxious, on Edge 0 0 0 0  Control/stop worrying 0 0 0 0  Worry too much - different things 0 0 0 0  Trouble relaxing 0 0 0 0  Restless 0 0 0 0  Easily annoyed or irritable 0 0 0 0  Afraid - awful might happen 0 0 0 0  Total GAD 7 Score 0 0 0 0  Anxiety Difficulty  Not difficult at all Not difficult at all Not difficult at all    Pertinent labs & imaging results that were available during my care of the patient were reviewed by me and considered in my medical decision making.  Assessment & Plan:  Candace Allen was seen today for allergies.  Diagnoses and all orders for this visit:  1. Seasonal allergies (Primary) Will start medications as below. Discussed side effects. Encouraged patient to continue xyzal and to start flonase. Discussed saline rinses.  - montelukast (SINGULAIR) 10 MG tablet; Take 1 tablet  (10 mg total) by mouth at bedtime.  Dispense: 30 tablet; Refill: 3 - promethazine-dextromethorphan (PROMETHAZINE-DM) 6.25-15 MG/5ML syrup; Take 5 mLs by mouth 4 (four) times daily as needed.  Dispense: 118 mL; Refill: 0  2. Subacute cough As above.  - promethazine-dextromethorphan (PROMETHAZINE-DM) 6.25-15 MG/5ML syrup; Take 5 mLs by mouth 4 (four) times daily as needed.  Dispense: 118 mL; Refill: 0  3. Sciatic pain, left Provided injection as below. Provided handout on stretching for sciatic pain.  - methylPREDNISolone acetate (DEPO-MEDROL) injection 40 mg   Continue all other maintenance medications.  Follow up plan: Return if  symptoms worsen or fail to improve.   Continue healthy lifestyle choices, including diet (rich in fruits, vegetables, and lean proteins, and low in salt and simple carbohydrates) and exercise (at least 30 minutes of moderate physical activity daily).  Written and verbal instructions provided   The above assessment and management plan was discussed with the patient. The patient verbalized understanding of and has agreed to the management plan. Patient is aware to call the clinic if they develop any new symptoms or if symptoms persist or worsen. Patient is aware when to return to the clinic for a follow-up visit. Patient educated on when it is appropriate to go to the emergency department.   Neale Burly, DNP-FNP [facility] [address] [place], [place] [address] [phone]

## 2023-09-03 NOTE — Telephone Encounter (Signed)
 LMOVM that pt will have to call Dr. Lucio Edward office w/ her insurance information to find out if they are in network or not.

## 2023-09-04 ENCOUNTER — Other Ambulatory Visit (HOSPITAL_COMMUNITY): Payer: Self-pay | Admitting: Nephrology

## 2023-09-04 DIAGNOSIS — R944 Abnormal results of kidney function studies: Secondary | ICD-10-CM

## 2023-09-04 DIAGNOSIS — N1831 Chronic kidney disease, stage 3a: Secondary | ICD-10-CM

## 2023-09-04 LAB — CYTOLOGY - PAP
Comment: NEGATIVE
Diagnosis: NEGATIVE
High risk HPV: NEGATIVE

## 2023-09-09 ENCOUNTER — Other Ambulatory Visit: Payer: Self-pay | Admitting: Family Medicine

## 2023-09-09 DIAGNOSIS — Z1231 Encounter for screening mammogram for malignant neoplasm of breast: Secondary | ICD-10-CM

## 2023-09-10 ENCOUNTER — Ambulatory Visit
Admission: RE | Admit: 2023-09-10 | Discharge: 2023-09-10 | Disposition: A | Discharge status: Home / Self Care | Payer: Medicaid Other | Source: Ambulatory Visit | Attending: Family Medicine | Admitting: Family Medicine

## 2023-09-10 DIAGNOSIS — Z1231 Encounter for screening mammogram for malignant neoplasm of breast: Secondary | ICD-10-CM | POA: Diagnosis not present

## 2023-09-11 NOTE — Telephone Encounter (Signed)
 Imaging has not been read yet, will update chart once completed

## 2023-09-15 ENCOUNTER — Encounter: Payer: Self-pay | Admitting: Family Medicine

## 2023-09-15 NOTE — Progress Notes (Signed)
Normal mammogram. Repeat in one year

## 2023-09-19 ENCOUNTER — Ambulatory Visit (INDEPENDENT_AMBULATORY_CARE_PROVIDER_SITE_OTHER)

## 2023-09-19 ENCOUNTER — Ambulatory Visit: Payer: Medicaid Other | Admitting: Family Medicine

## 2023-09-19 ENCOUNTER — Encounter: Payer: Self-pay | Admitting: Family Medicine

## 2023-09-19 VITALS — BP 97/64 | HR 91 | Temp 98.2°F | Ht 70.0 in | Wt 156.0 lb

## 2023-09-19 DIAGNOSIS — M25562 Pain in left knee: Secondary | ICD-10-CM

## 2023-09-19 DIAGNOSIS — M1712 Unilateral primary osteoarthritis, left knee: Secondary | ICD-10-CM | POA: Diagnosis not present

## 2023-09-19 DIAGNOSIS — R21 Rash and other nonspecific skin eruption: Secondary | ICD-10-CM | POA: Diagnosis not present

## 2023-09-19 DIAGNOSIS — G8929 Other chronic pain: Secondary | ICD-10-CM

## 2023-09-19 DIAGNOSIS — F988 Other specified behavioral and emotional disorders with onset usually occurring in childhood and adolescence: Secondary | ICD-10-CM | POA: Diagnosis not present

## 2023-09-19 DIAGNOSIS — M5432 Sciatica, left side: Secondary | ICD-10-CM

## 2023-09-19 MED ORDER — METHOCARBAMOL 750 MG PO TABS: 750.0000 mg | ORAL_TABLET | Freq: Four times a day (QID) | ORAL | 0 refills | Status: DC

## 2023-09-19 MED ORDER — LISDEXAMFETAMINE DIMESYLATE 40 MG PO CAPS: 40.0000 mg | ORAL_CAPSULE | ORAL | 0 refills | Status: DC

## 2023-09-19 MED ORDER — PREDNISONE 20 MG PO TABS: 40.0000 mg | ORAL_TABLET | Freq: Every day | ORAL | 0 refills | Status: AC

## 2023-09-19 NOTE — Patient Instructions (Signed)
 [facility] [address] - [place] [address] [phone]
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
## 2023-09-23 ENCOUNTER — Encounter: Payer: Self-pay | Admitting: Family Medicine

## 2023-09-23 ENCOUNTER — Other Ambulatory Visit: Payer: Self-pay | Admitting: Family Medicine

## 2023-09-23 DIAGNOSIS — F988 Other specified behavioral and emotional disorders with onset usually occurring in childhood and adolescence: Secondary | ICD-10-CM

## 2023-09-23 NOTE — Progress Notes (Signed)
 Mild OA on left knee. Recommend continue follow up with PT, still pending. Continue heat, ice, elevation, compression, tylenol as needed for pain.

## 2023-09-23 NOTE — Telephone Encounter (Signed)
 Informed pt that this was sent in at her visit on 09/19/23

## 2023-10-08 ENCOUNTER — Other Ambulatory Visit: Payer: Self-pay

## 2023-10-08 ENCOUNTER — Ambulatory Visit: Attending: Family Medicine | Admitting: Physical Therapy

## 2023-10-08 ENCOUNTER — Encounter: Payer: Self-pay | Admitting: Physical Therapy

## 2023-10-08 DIAGNOSIS — M25562 Pain in left knee: Secondary | ICD-10-CM | POA: Diagnosis present

## 2023-10-08 DIAGNOSIS — G8929 Other chronic pain: Secondary | ICD-10-CM | POA: Diagnosis present
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
## 2023-10-09 ENCOUNTER — Ambulatory Visit: Admitting: *Deleted

## 2023-10-09 ENCOUNTER — Encounter: Payer: Self-pay | Admitting: *Deleted

## 2023-10-09 DIAGNOSIS — M25562 Pain in left knee: Secondary | ICD-10-CM | POA: Diagnosis not present

## 2023-10-09 DIAGNOSIS — G8929 Other chronic pain: Secondary | ICD-10-CM
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
## 2023-10-14 ENCOUNTER — Ambulatory Visit: Admitting: Physical Therapy

## 2023-10-14 ENCOUNTER — Encounter: Payer: Self-pay | Admitting: Physical Therapy

## 2023-10-14 DIAGNOSIS — M25562 Pain in left knee: Secondary | ICD-10-CM | POA: Diagnosis not present

## 2023-10-14 DIAGNOSIS — G8929 Other chronic pain: Secondary | ICD-10-CM

## 2023-10-14 NOTE — Therapy (Signed)
 OUTPATIENT PHYSICAL THERAPY LOWER EXTREMITY EVALUATION   Patient Name: Candace Allen MRN: [id number] DOB:02-07-1966, 58 y.o., female Today's Date: 10/14/2023  END OF SESSION:  PT End of Session - 10/14/23 0826     Visit Number 3    Number of Visits 12    Date for PT Re-Evaluation 11/19/23    PT Start Time 0802    PT Stop Time 0850    PT Time Calculation (min) 48 min    Activity Tolerance Patient tolerated treatment well    Behavior During Therapy WFL for tasks assessed/performed             Past Medical History:  Diagnosis Date   Attention deficit disorder (ADD) in adult    Chronic renal insufficiency, stage 3 (moderate) (HCC)    ?d/t dexilant   Diverticulosis 02/19/2018   By colonoscopy 2016   Endometrial cancer (HCC)    GERD (gastroesophageal reflux disease)    Hx of concussion    Approximately 2016   Hypothyroidism    question of-->no hx of thyroid  lab abnormalities in record   Hypothyroidism (acquired) 08/20/2019   IBS (irritable bowel syndrome)    Past Surgical History:  Procedure Laterality Date   CHOLECYSTECTOMY N/A 11/08/2021   Procedure: LAPAROSCOPIC CHOLECYSTECTOMY;  Surgeon: Marijo Shove, DO;  Location: [facility];  Service: General;  Laterality: N/A;   COLONOSCOPY  2016   2016 no polyps. Rpt at [facility] 2022 per pt, normal, no record available   ENDOMETRIAL ABLATION     SHOULDER SURGERY Left    Approximately 2016   TRANSTHORACIC ECHOCARDIOGRAM     01/2021 NORMAL   TUBAL LIGATION     UPPER GASTROINTESTINAL ENDOSCOPY  11/10/2019   neg   Patient Active Problem List   Diagnosis Date Noted   Encounter for screening fecal occult blood testing 09/02/2023   Routine general medical examination at a health care facility 09/02/2023   Environmental and seasonal allergies 09/02/2023   Stage 3a chronic kidney disease (HCC) 07/22/2023   Attention deficit disorder (ADD) in adult 07/17/2023   Decreased GFR 07/17/2023   Pure hypercholesterolemia 07/17/2023    Calculus of gallbladder without cholecystitis without obstruction    Other fatigue 09/13/2021   TIA (transient ischemic attack) 10/20/2020   Postmenopausal 11/09/2019   Weight loss 11/09/2019   History of endometrial cancer 02/19/2018   GERD (gastroesophageal reflux disease) 02/19/2018   IBS (irritable bowel syndrome) 02/19/2018   Diverticulosis 02/19/2018    REFERRING PROVIDER:  Chrystine Crate  REFERRING DIAG: Chronic pain of left knee.  THERAPY DIAG:  Chronic pain of left knee  Rationale for Evaluation and Treatment: Rehabilitation  ONSET DATE: 8 years.    SUBJECTIVE:   SUBJECTIVE STATEMENT: Sore from last treatment. PERTINENT HISTORY: Please see above.  Left Sciatica.  PAIN:  Are you having pain? Yes: NPRS scale: 2/10. Pain location: Left knee. Pain description: Ache, sore, throbbing, sharp. Aggravating factors: "Everything." Relieving factors: "Nothing."  PRECAUTIONS: Other: No pain increase therex.  WEIGHT BEARING RESTRICTIONS: No  FALLS:  Has patient fallen in last 6 months? No  LIVING ENVIRONMENT: Lives with: lives with their spouse Lives in: House/apartment Has following equipment at home: None  OCCUPATION: Self-employed.    PLOF: Independent  PATIENT GOALS: To do things she wants to do without knee pain.     OBJECTIVE:  Note: Objective measures were completed at Evaluation unless otherwise noted.  DIAGNOSTIC FINDINGS: 09/19/23:  X-ray:  FINDINGS: Frontal view both knees with lateral view of the  left knee provided. The alignment and joint spaces are preserved. There is mild peripheral spurring of the medial tibiofemoral and patellofemoral compartments of the left knee. No fracture. No erosions or focal bone abnormality. No joint effusion. Unremarkable soft tissues. Frontal view of the right knee is unremarkable.   IMPRESSION: Mild osteoarthritis of the left knee.  EDEMA:  Circumferential: Equal. No edema noted  today.  PALPATION: No notable palpable pain today.   LOWER EXTREMITY ROM:  Full active left knee range of motion.  The patient's knees are remarkable for genu recurvatum.    LOWER EXTREMITY MMT:  Normal right knee and hip strength.  LOWER EXTREMITY SPECIAL TESTS:  The patient's knee is stable. (-) Anterior Drawer, varus/valgus testing.    GAIT: Normal gait pattern.                                                                                                                                TREATMENT DATE:     10/14/23:                                     EXERCISE LOG  Exercise Repetitions and Resistance Comments  Recumbent bike Level 3 x 15 minutes   Knee ext 10# x 3 minutes   Ham curls 40# x 3 minutes   Leg Press 2 plates x 3 minutes       LE elevation and vasopneumatic x 15 minutes to patient's left knee.  10-09-23                                    EXERCISE LOG  LT knee  Exercise Repetitions and Resistance Comments  Bike X10 mins   Heel ups/Toe ups X fatigue HEP 3 x fatigue  QS and SLR 3x10 HEP  SL Hip ABD 3x10 HEP  SAQ 3# 2x10 hold 10 secs   Clam shell     Blank cell = exercise not performed today    PATIENT EDUCATION:  Education details: Discussed avoiding activities that reproduce patient's knee pain. Person educated: Patient Education method: Explanation Education comprehension: verbalized understanding  HOME EXERCISE PROGRAM:   ASSESSMENT:  CLINICAL IMPRESSION: Patient did very well with weight machines today and performed with excellent technique and no pain increase.  The patient states she has a [employer] investment [employer] and can replicate these exercises there.     OBJECTIVE IMPAIRMENTS: decreased activity tolerance and pain.   ACTIVITY LIMITATIONS: carrying, lifting, and bending  PARTICIPATION LIMITATIONS: meal prep, cleaning, laundry, and yard work  PERSONAL FACTORS: Time since onset of injury/illness/exacerbation are also affecting  patient's functional outcome.   REHAB POTENTIAL: Good  CLINICAL DECISION MAKING: Evolving/moderate complexity  EVALUATION COMPLEXITY: Low   GOALS:  LONG TERM GOALS: Target date: 11/19/23  Ind with a HEP.  Goal status: INITIAL  2.  Perform ADL's with left knee pain not > 2-3/10.  Goal status: INITIAL  3.  Walk a community distance with pain not > 2-3/10.  Goal status: INITIAL   PLAN:  PT FREQUENCY: 2x/week  PT DURATION: 6 weeks  PLANNED INTERVENTIONS: 97110-Therapeutic exercises, 97530- Therapeutic activity, [id number]- Neuromuscular re-education, 97535- Self Care, 16109- Manual therapy, G0283- Electrical stimulation (unattended), 97016- Vasopneumatic device, Patient/Family education, Cryotherapy, and Moist heat  PLAN FOR NEXT SESSION: Pain-free left LE therex (O and CKC), added hip abduction exercises and "clamshell."  Modalities as needed.     Carl Bleecker, [place], PT 10/14/2023, 9:06 AM

## 2023-10-16 ENCOUNTER — Ambulatory Visit: Attending: Family Medicine | Admitting: *Deleted

## 2023-10-16 ENCOUNTER — Encounter: Payer: Self-pay | Admitting: *Deleted

## 2023-10-16 DIAGNOSIS — G8929 Other chronic pain: Secondary | ICD-10-CM | POA: Insufficient documentation

## 2023-10-16 DIAGNOSIS — M25562 Pain in left knee: Secondary | ICD-10-CM | POA: Insufficient documentation

## 2023-10-16 NOTE — Therapy (Signed)
 OUTPATIENT PHYSICAL THERAPY LOWER EXTREMITY EVALUATION   Patient Name: Candace Allen MRN: [id number] DOB:1966/01/20, 58 y.o., female Today's Date: 10/16/2023  END OF SESSION:  PT End of Session - 10/16/23 0818     Visit Number 4    Number of Visits 12    Date for PT Re-Evaluation 11/19/23    PT Start Time 0808    PT Stop Time 0908    PT Time Calculation (min) 60 min             Past Medical History:  Diagnosis Date   Attention deficit disorder (ADD) in adult    Chronic renal insufficiency, stage 3 (moderate) (HCC)    ?d/t dexilant   Diverticulosis 02/19/2018   By colonoscopy 2016   Endometrial cancer (HCC)    GERD (gastroesophageal reflux disease)    Hx of concussion    Approximately 2016   Hypothyroidism    question of-->no hx of thyroid  lab abnormalities in record   Hypothyroidism (acquired) 08/20/2019   IBS (irritable bowel syndrome)    Past Surgical History:  Procedure Laterality Date   CHOLECYSTECTOMY N/A 11/08/2021   Procedure: LAPAROSCOPIC CHOLECYSTECTOMY;  Surgeon: Marijo Shove, DO;  Location: [facility];  Service: General;  Laterality: N/A;   COLONOSCOPY  2016   2016 no polyps. Rpt at [facility] 2022 per pt, normal, no record available   ENDOMETRIAL ABLATION     SHOULDER SURGERY Left    Approximately 2016   TRANSTHORACIC ECHOCARDIOGRAM     01/2021 NORMAL   TUBAL LIGATION     UPPER GASTROINTESTINAL ENDOSCOPY  11/10/2019   neg   Patient Active Problem List   Diagnosis Date Noted   Encounter for screening fecal occult blood testing 09/02/2023   Routine general medical examination at a health care facility 09/02/2023   Environmental and seasonal allergies 09/02/2023   Stage 3a chronic kidney disease (HCC) 07/22/2023   Attention deficit disorder (ADD) in adult 07/17/2023   Decreased GFR 07/17/2023   Pure hypercholesterolemia 07/17/2023   Calculus of gallbladder without cholecystitis without obstruction    Other fatigue 09/13/2021   TIA (transient  ischemic attack) 10/20/2020   Postmenopausal 11/09/2019   Weight loss 11/09/2019   History of endometrial cancer 02/19/2018   GERD (gastroesophageal reflux disease) 02/19/2018   IBS (irritable bowel syndrome) 02/19/2018   Diverticulosis 02/19/2018    REFERRING PROVIDER:  Chrystine Crate  REFERRING DIAG: Chronic pain of left knee.  THERAPY DIAG:  Chronic pain of left knee  Rationale for Evaluation and Treatment: Rehabilitation  ONSET DATE: 8 years.    SUBJECTIVE:   SUBJECTIVE STATEMENT: 7/10 LT knee pain after last Rx. Felt light headed earlier today.      PERTINENT HISTORY: Please see above.  Left Sciatica.  PAIN:  Are you having pain? Yes: NPRS scale: 2/10. Pain location: Left knee. Pain description: Ache, sore, throbbing, sharp. Aggravating factors: "Everything." Relieving factors: "Nothing."  PRECAUTIONS: Other: No pain increase therex.  WEIGHT BEARING RESTRICTIONS: No  FALLS:  Has patient fallen in last 6 months? No  LIVING ENVIRONMENT: Lives with: lives with their spouse Lives in: House/apartment Has following equipment at home: None  OCCUPATION: Self-employed.    PLOF: Independent  PATIENT GOALS: To do things she wants to do without knee pain.     OBJECTIVE:  Note: Objective measures were completed at Evaluation unless otherwise noted.  DIAGNOSTIC FINDINGS: 09/19/23:  X-ray:  FINDINGS: Frontal view both knees with lateral view of the left knee provided. The alignment and  joint spaces are preserved. There is mild peripheral spurring of the medial tibiofemoral and patellofemoral compartments of the left knee. No fracture. No erosions or focal bone abnormality. No joint effusion. Unremarkable soft tissues. Frontal view of the right knee is unremarkable.   IMPRESSION: Mild osteoarthritis of the left knee.  EDEMA:  Circumferential: Equal. No edema noted today.  PALPATION: No notable palpable pain today.   LOWER EXTREMITY ROM:  Full  active left knee range of motion.  The patient's knees are remarkable for genu recurvatum.    LOWER EXTREMITY MMT:  Normal right knee and hip strength.  LOWER EXTREMITY SPECIAL TESTS:  The patient's knee is stable. (-) Anterior Drawer, varus/valgus testing.    GAIT: Normal gait pattern.                                                                                                                                TREATMENT DATE:   10/16/23 Bike L 4 x 15 mins  SAQ's  3# x10 hold 5 secs,   2 x 10 hold 10 secs Reviewed HEP LE elevation and vasopneumatic x 15 minutes with IFC 100% scan to patient's left knee.   10/14/23:                                     EXERCISE LOG  Exercise Repetitions and Resistance Comments  Recumbent bike Level 3 x 15 minutes   Knee ext 10# x 3 minutes   Ham curls 40# x 3 minutes   Leg Press 2 plates x 3 minutes       LE elevation and vasopneumatic x 15 minutes to patient's left knee.  10-09-23                                    EXERCISE LOG  LT knee  Exercise Repetitions and Resistance Comments  Bike X10 mins   Heel ups/Toe ups X fatigue HEP 3 x fatigue  QS and SLR 3x10 HEP  SL Hip ABD 3x10 HEP  SAQ 3# 2x10 hold 10 secs   Clam shell     Blank cell = exercise not performed today    PATIENT EDUCATION:  Education details: Discussed avoiding activities that reproduce patient's knee pain. Person educated: Patient Education method: Explanation Education comprehension: verbalized understanding  HOME EXERCISE PROGRAM:   ASSESSMENT:  CLINICAL IMPRESSION: Patient arrived today not feeling great due to some light headedness earlier due to possible Low blood sugar. She also reports her knee stayed sore after last Rx. Rx today was more conservative  and focused on ROM, quad activation and strengthening at low intensities f/b Vaso and IFC. LTG's Ongoing due to pain.   OBJECTIVE IMPAIRMENTS: decreased activity tolerance and pain.   ACTIVITY  LIMITATIONS: carrying, lifting, and bending  PARTICIPATION LIMITATIONS: meal prep, cleaning, laundry, and yard work  PERSONAL FACTORS: Time since onset of injury/illness/exacerbation are also affecting patient's functional outcome.   REHAB POTENTIAL: Good  CLINICAL DECISION MAKING: Evolving/moderate complexity  EVALUATION COMPLEXITY: Low   GOALS:  LONG TERM GOALS: Target date: 11/19/23  Ind with a HEP.  Goal status: MET  2.  Perform ADL's with left knee pain not > 2-3/10.  Goal status: On going  3.  Walk a community distance with pain not > 2-3/10.  Goal status: On going   PLAN:  PT FREQUENCY: 2x/week  PT DURATION: 6 weeks  PLANNED INTERVENTIONS: 97110-Therapeutic exercises, 97530- Therapeutic activity, [id number]- Neuromuscular re-education, 97535- Self Care, [id number]- Manual therapy, G0283- Electrical stimulation (unattended), 97016- Vasopneumatic device, Patient/Family education, Cryotherapy, and Moist heat  PLAN FOR NEXT SESSION: Pain-free left LE therex (O and CKC), added hip abduction exercises and "clamshell."  Modalities as needed.     Hephzibah Strehle,CHRIS, PTA 10/16/2023, 9:44 AM

## 2023-10-17 ENCOUNTER — Other Ambulatory Visit: Payer: Self-pay | Admitting: Family Medicine

## 2023-10-17 DIAGNOSIS — R052 Subacute cough: Secondary | ICD-10-CM

## 2023-10-17 DIAGNOSIS — J302 Other seasonal allergic rhinitis: Secondary | ICD-10-CM

## 2023-10-17 MED ORDER — PROMETHAZINE-DM 6.25-15 MG/5ML PO SYRP: 5.0000 mL | ORAL_SOLUTION | Freq: Four times a day (QID) | ORAL | 0 refills | Status: DC | PRN

## 2023-10-20 ENCOUNTER — Other Ambulatory Visit: Payer: Self-pay | Admitting: Family Medicine

## 2023-10-20 ENCOUNTER — Ambulatory Visit: Admitting: *Deleted

## 2023-10-20 ENCOUNTER — Encounter: Payer: Self-pay | Admitting: *Deleted

## 2023-10-20 ENCOUNTER — Encounter: Payer: Self-pay | Admitting: Family Medicine

## 2023-10-20 DIAGNOSIS — F988 Other specified behavioral and emotional disorders with onset usually occurring in childhood and adolescence: Secondary | ICD-10-CM

## 2023-10-20 DIAGNOSIS — M25562 Pain in left knee: Secondary | ICD-10-CM | POA: Diagnosis not present

## 2023-10-20 DIAGNOSIS — G8929 Other chronic pain: Secondary | ICD-10-CM

## 2023-10-20 NOTE — Therapy (Signed)
 OUTPATIENT PHYSICAL THERAPY LOWER EXTREMITY EVALUATION   Patient Name: Candace Allen MRN: [id number] DOB:1966-02-27, 58 y.o., female Today's Date: 10/20/2023  END OF SESSION:  PT End of Session - 10/20/23 1016     Visit Number 5    Number of Visits 12    Date for PT Re-Evaluation 11/19/23    PT Start Time 1015    PT Stop Time 1110    PT Time Calculation (min) 55 min             Past Medical History:  Diagnosis Date   Attention deficit disorder (ADD) in adult    Chronic renal insufficiency, stage 3 (moderate) (HCC)    ?d/t dexilant   Diverticulosis 02/19/2018   By colonoscopy 2016   Endometrial cancer (HCC)    GERD (gastroesophageal reflux disease)    Hx of concussion    Approximately 2016   Hypothyroidism    question of-->no hx of thyroid  lab abnormalities in record   Hypothyroidism (acquired) 08/20/2019   IBS (irritable bowel syndrome)    Past Surgical History:  Procedure Laterality Date   CHOLECYSTECTOMY N/A 11/08/2021   Procedure: LAPAROSCOPIC CHOLECYSTECTOMY;  Surgeon: Marijo Shove, DO;  Location: [facility];  Service: General;  Laterality: N/A;   COLONOSCOPY  2016   2016 no polyps. Rpt at [facility] 2022 per pt, normal, no record available   ENDOMETRIAL ABLATION     SHOULDER SURGERY Left    Approximately 2016   TRANSTHORACIC ECHOCARDIOGRAM     01/2021 NORMAL   TUBAL LIGATION     UPPER GASTROINTESTINAL ENDOSCOPY  11/10/2019   neg   Patient Active Problem List   Diagnosis Date Noted   Encounter for screening fecal occult blood testing 09/02/2023   Routine general medical examination at a health care facility 09/02/2023   Environmental and seasonal allergies 09/02/2023   Stage 3a chronic kidney disease (HCC) 07/22/2023   Attention deficit disorder (ADD) in adult 07/17/2023   Decreased GFR 07/17/2023   Pure hypercholesterolemia 07/17/2023   Calculus of gallbladder without cholecystitis without obstruction    Other fatigue 09/13/2021   TIA (transient  ischemic attack) 10/20/2020   Postmenopausal 11/09/2019   Weight loss 11/09/2019   History of endometrial cancer 02/19/2018   GERD (gastroesophageal reflux disease) 02/19/2018   IBS (irritable bowel syndrome) 02/19/2018   Diverticulosis 02/19/2018    REFERRING PROVIDER:  Chrystine Crate  REFERRING DIAG: Chronic pain of left knee.  THERAPY DIAG:  Chronic pain of left knee  Rationale for Evaluation and Treatment: Rehabilitation  ONSET DATE: 8 years.    SUBJECTIVE:   SUBJECTIVE STATEMENT: 4-5/10 LT knee pain. Did okay after last Rx. Feel better today     PERTINENT HISTORY: Please see above.  Left Sciatica.  PAIN:  Are you having pain? Yes: NPRS scale: 4-5/10. Pain location: Left knee. Pain description: Ache, sore, throbbing, sharp. Aggravating factors: "Everything." Relieving factors: "Nothing."  PRECAUTIONS: Other: No pain increase therex.  WEIGHT BEARING RESTRICTIONS: No  FALLS:  Has patient fallen in last 6 months? No  LIVING ENVIRONMENT: Lives with: lives with their spouse Lives in: House/apartment Has following equipment at home: None  OCCUPATION: Self-employed.    PLOF: Independent  PATIENT GOALS: To do things she wants to do without knee pain.     OBJECTIVE:  Note: Objective measures were completed at Evaluation unless otherwise noted.  DIAGNOSTIC FINDINGS: 09/19/23:  X-ray:  FINDINGS: Frontal view both knees with lateral view of the left knee provided. The alignment and joint  spaces are preserved. There is mild peripheral spurring of the medial tibiofemoral and patellofemoral compartments of the left knee. No fracture. No erosions or focal bone abnormality. No joint effusion. Unremarkable soft tissues. Frontal view of the right knee is unremarkable.   IMPRESSION: Mild osteoarthritis of the left knee.  EDEMA:  Circumferential: Equal. No edema noted today.  PALPATION: No notable palpable pain today.   LOWER EXTREMITY ROM:  Full  active left knee range of motion.  The patient's knees are remarkable for genu recurvatum.    LOWER EXTREMITY MMT:  Normal right knee and hip strength.  LOWER EXTREMITY SPECIAL TESTS:  The patient's knee is stable. (-) Anterior Drawer, varus/valgus testing.    GAIT: Normal gait pattern.                                                                                                                                TREATMENT DATE:   10/20/23                                 EXERCISE LOG   LT knee  Exercise Repetitions and Resistance Comments  Recumbent bike Level 3 x 20 minutes   Knee ext 20  # x 3 minutes   Ham curls 40# x 3 minutes   Leg Press        LE elevation and vasopneumatic x 15 minutes with IFC  x 15 mins at100% scan to patient's left knee.     Bike L 4 x 15 mins  SAQ's  3# x10 hold 5 secs,   2 x 10 hold 10 secs Reviewed HEP LE elevation and vasopneumatic x 15 minutes with IFC 100% scan to patient's left knee.   10/14/23:                                     EXERCISE LOG  Exercise Repetitions and Resistance Comments  Recumbent bike Level 3 x 15 minutes   Knee ext 10# x 3 minutes   Ham curls 40# x 3 minutes   Leg Press 2 plates x 3 minutes       LE elevation and vasopneumatic x 15 minutes to patient's left knee.  10-09-23                                    EXERCISE LOG  LT knee  Exercise Repetitions and Resistance Comments  Bike X10 mins   Heel ups/Toe ups X fatigue HEP 3 x fatigue  QS and SLR 3x10 HEP  SL Hip ABD 3x10 HEP  SAQ 3# 2x10 hold 10 secs   Clam shell     Blank cell = exercise not performed today    PATIENT EDUCATION:  Education details: Discussed avoiding activities that reproduce patient's knee pain. Person educated: Patient Education method: Explanation Education comprehension: verbalized understanding  HOME EXERCISE PROGRAM:   ASSESSMENT:  CLINICAL IMPRESSION: Patient arrived today feeling better over all. She was able to increase  LT knee LE exs with machines without increased pain just mm fatigue. Progress as tolerated next visit. LTGs on going today due to  overall knee pain greater than 2-3/10 at times. Vaso and IFC. End of session. Pain end of session 2-3/10   OBJECTIVE IMPAIRMENTS: decreased activity tolerance and pain.   ACTIVITY LIMITATIONS: carrying, lifting, and bending  PARTICIPATION LIMITATIONS: meal prep, cleaning, laundry, and yard work  PERSONAL FACTORS: Time since onset of injury/illness/exacerbation are also affecting patient's functional outcome.   REHAB POTENTIAL: Good  CLINICAL DECISION MAKING: Evolving/moderate complexity  EVALUATION COMPLEXITY: Low   GOALS:  LONG TERM GOALS: Target date: 11/19/23  Ind with a HEP.  Goal status: MET  2.  Perform ADL's with left knee pain not > 2-3/10.  Goal status: On going  3.  Walk a community distance with pain not > 2-3/10.  Goal status: On going   PLAN:  PT FREQUENCY: 2x/week  PT DURATION: 6 weeks  PLANNED INTERVENTIONS: 97110-Therapeutic exercises, 97530- Therapeutic activity, [id number]- Neuromuscular re-education, 97535- Self Care, 01027- Manual therapy, [id number]- Electrical stimulation (unattended), 97016- Vasopneumatic device, Patient/Family education, Cryotherapy, and Moist heat  PLAN FOR NEXT SESSION: Pain-free left LE therex (O and CKC), added hip abduction exercises and "clamshell."  Modalities as needed.     Xolani Degracia,CHRIS, PTA 10/20/2023, 1:01 PM

## 2023-10-21 ENCOUNTER — Encounter (HOSPITAL_COMMUNITY): Payer: Self-pay

## 2023-10-21 ENCOUNTER — Observation Stay (HOSPITAL_COMMUNITY)
Admission: EM | Admit: 2023-10-21 | Discharge: 2023-10-22 | Disposition: A | Discharge status: Home / Self Care | Attending: Family Medicine | Admitting: Family Medicine

## 2023-10-21 ENCOUNTER — Emergency Department (HOSPITAL_COMMUNITY)

## 2023-10-21 ENCOUNTER — Other Ambulatory Visit: Payer: Self-pay

## 2023-10-21 DIAGNOSIS — E785 Hyperlipidemia, unspecified: Secondary | ICD-10-CM | POA: Insufficient documentation

## 2023-10-21 DIAGNOSIS — E039 Hypothyroidism, unspecified: Secondary | ICD-10-CM | POA: Diagnosis not present

## 2023-10-21 DIAGNOSIS — N179 Acute kidney failure, unspecified: Secondary | ICD-10-CM | POA: Diagnosis not present

## 2023-10-21 DIAGNOSIS — I129 Hypertensive chronic kidney disease with stage 1 through stage 4 chronic kidney disease, or unspecified chronic kidney disease: Secondary | ICD-10-CM | POA: Diagnosis not present

## 2023-10-21 DIAGNOSIS — K219 Gastro-esophageal reflux disease without esophagitis: Secondary | ICD-10-CM | POA: Diagnosis not present

## 2023-10-21 DIAGNOSIS — N183 Chronic kidney disease, stage 3 unspecified: Secondary | ICD-10-CM | POA: Insufficient documentation

## 2023-10-21 DIAGNOSIS — R531 Weakness: Principal | ICD-10-CM | POA: Insufficient documentation

## 2023-10-21 DIAGNOSIS — I10 Essential (primary) hypertension: Secondary | ICD-10-CM

## 2023-10-21 DIAGNOSIS — R29818 Other symptoms and signs involving the nervous system: Secondary | ICD-10-CM | POA: Diagnosis not present

## 2023-10-21 DIAGNOSIS — Z8542 Personal history of malignant neoplasm of other parts of uterus: Secondary | ICD-10-CM | POA: Insufficient documentation

## 2023-10-21 DIAGNOSIS — R2990 Unspecified symptoms and signs involving the nervous system: Principal | ICD-10-CM

## 2023-10-21 DIAGNOSIS — Z8673 Personal history of transient ischemic attack (TIA), and cerebral infarction without residual deficits: Secondary | ICD-10-CM

## 2023-10-21 DIAGNOSIS — R200 Anesthesia of skin: Secondary | ICD-10-CM

## 2023-10-21 DIAGNOSIS — R42 Dizziness and giddiness: Secondary | ICD-10-CM | POA: Diagnosis not present

## 2023-10-21 DIAGNOSIS — I639 Cerebral infarction, unspecified: Secondary | ICD-10-CM | POA: Diagnosis not present

## 2023-10-21 DIAGNOSIS — Z79899 Other long term (current) drug therapy: Secondary | ICD-10-CM | POA: Insufficient documentation

## 2023-10-21 DIAGNOSIS — R202 Paresthesia of skin: Secondary | ICD-10-CM | POA: Diagnosis not present

## 2023-10-21 LAB — COMPREHENSIVE METABOLIC PANEL WITH GFR
ALT: 43 U/L (ref 0–44)
AST: 65 U/L — ABNORMAL HIGH (ref 15–41)
Albumin: 4.3 g/dL (ref 3.5–5.0)
Alkaline Phosphatase: 83 U/L (ref 38–126)
Anion gap: 7 (ref 5–15)
BUN: 14 mg/dL (ref 6–20)
CO2: 28 mmol/L (ref 22–32)
Calcium: 9.4 mg/dL (ref 8.9–10.3)
Chloride: 101 mmol/L (ref 98–111)
Creatinine, Ser: 1.78 mg/dL — ABNORMAL HIGH (ref 0.44–1.00)
GFR, Estimated: 32 mL/min — ABNORMAL LOW (ref >60)
Glucose, Bld: 88 mg/dL (ref 70–99)
Potassium: 3.5 mmol/L (ref 3.5–5.1)
Sodium: 136 mmol/L (ref 135–145)
Total Bilirubin: 0.6 mg/dL (ref 0.0–1.2)
Total Protein: 7 g/dL (ref 6.5–8.1)

## 2023-10-21 LAB — CBC
HCT: 38.6 % (ref 36.0–46.0)
Hemoglobin: 12.6 g/dL (ref 12.0–15.0)
MCH: 31.3 pg (ref 26.0–34.0)
MCHC: 32.6 g/dL (ref 30.0–36.0)
MCV: 96 fL (ref 80.0–100.0)
Platelets: 261 10*3/uL (ref 150–400)
RBC: 4.02 MIL/uL (ref 3.87–5.11)
RDW: 12.7 % (ref 11.5–15.5)
WBC: 6 10*3/uL (ref 4.0–10.5)
nRBC: 0 % (ref 0.0–0.2)

## 2023-10-21 LAB — RAPID URINE DRUG SCREEN, HOSP PERFORMED
Amphetamines: NOT DETECTED
Barbiturates: NOT DETECTED
Benzodiazepines: NOT DETECTED
Cocaine: NOT DETECTED
Opiates: NOT DETECTED
Tetrahydrocannabinol: NOT DETECTED

## 2023-10-21 LAB — DIFFERENTIAL
Abs Immature Granulocytes: 0.01 10*3/uL (ref 0.00–0.07)
Basophils Absolute: 0 10*3/uL (ref 0.0–0.1)
Basophils Relative: 1 %
Eosinophils Absolute: 0.1 10*3/uL (ref 0.0–0.5)
Eosinophils Relative: 2 %
Immature Granulocytes: 0 %
Lymphocytes Relative: 40 %
Lymphs Abs: 2.4 10*3/uL (ref 0.7–4.0)
Monocytes Absolute: 0.6 10*3/uL (ref 0.1–1.0)
Monocytes Relative: 10 %
Neutro Abs: 2.9 10*3/uL (ref 1.7–7.7)
Neutrophils Relative %: 47 %

## 2023-10-21 LAB — CBG MONITORING, ED
Glucose-Capillary: 80 mg/dL (ref 70–99)
Glucose-Capillary: 84 mg/dL (ref 70–99)
Glucose-Capillary: 85 mg/dL (ref 70–99)

## 2023-10-21 LAB — APTT: aPTT: 29 s (ref 24–36)

## 2023-10-21 LAB — PROTIME-INR
INR: 0.9 (ref 0.8–1.2)
Prothrombin Time: 12.3 s (ref 11.4–15.2)

## 2023-10-21 LAB — ETHANOL: Alcohol, Ethyl (B): <15 mg/dL (ref <15)

## 2023-10-21 MED ORDER — LORATADINE 10 MG PO TABS
10.0000 mg | ORAL_TABLET | Freq: Every evening | ORAL | Status: DC
  Administered 2023-10-21: 10 mg via ORAL
  Filled 2023-10-21: qty 1

## 2023-10-21 MED ORDER — VENLAFAXINE HCL ER 37.5 MG PO CP24
37.5000 mg | ORAL_CAPSULE | Freq: Every day | ORAL | Status: DC
  Administered 2023-10-22: 37.5 mg via ORAL
  Filled 2023-10-21 (×2): qty 1

## 2023-10-21 MED ORDER — LACTATED RINGERS IV BOLUS
1000.0000 mL | Freq: Once | INTRAVENOUS | Status: AC
  Administered 2023-10-21: 1000 mL via INTRAVENOUS

## 2023-10-21 MED ORDER — MONTELUKAST SODIUM 10 MG PO TABS
10.0000 mg | ORAL_TABLET | Freq: Every day | ORAL | Status: DC
  Administered 2023-10-21: 10 mg via ORAL
  Filled 2023-10-21: qty 1

## 2023-10-21 MED ORDER — LACTATED RINGERS IV SOLN: INTRAVENOUS | Status: DC

## 2023-10-21 MED ORDER — FAMOTIDINE 20 MG PO TABS
40.0000 mg | ORAL_TABLET | Freq: Two times a day (BID) | ORAL | Status: DC | PRN
  Administered 2023-10-22: 40 mg via ORAL
  Filled 2023-10-21: qty 2

## 2023-10-21 MED ORDER — ENOXAPARIN SODIUM 40 MG/0.4ML IJ SOSY
40.0000 mg | PREFILLED_SYRINGE | Freq: Every day | INTRAMUSCULAR | Status: DC
  Filled 2023-10-21: qty 0.4

## 2023-10-21 MED ORDER — MECLIZINE HCL 12.5 MG PO TABS
25.0000 mg | ORAL_TABLET | Freq: Once | ORAL | Status: AC
  Administered 2023-10-21: 25 mg via ORAL
  Filled 2023-10-21: qty 2

## 2023-10-21 MED ORDER — LEVOCETIRIZINE DIHYDROCHLORIDE 5 MG PO TABS: 5.0000 mg | ORAL_TABLET | Freq: Every evening | ORAL | Status: DC

## 2023-10-21 MED ORDER — STROKE: EARLY STAGES OF RECOVERY BOOK: Freq: Once | Status: AC

## 2023-10-21 NOTE — ED Triage Notes (Signed)
 Pt arrived via POV from home c/o new onset facial numbness, dizziness, feeling lightheaded, left side weakness that began apprx PTA. Pt reports Hx of TIA 2 years ago.

## 2023-10-21 NOTE — ED Notes (Signed)
 Md notified

## 2023-10-21 NOTE — Progress Notes (Addendum)
 1521-Code stroke activated, LKWT 1300, left sided weakness and numbness/tingling  1524-to CT  1530-back from CT  1531-Dr. Doretta Gant joined tele-neuro cart for assessment

## 2023-10-21 NOTE — Progress Notes (Signed)
   10/21/23 2131  TOC Brief Assessment  Insurance and Status Reviewed  Patient has primary care physician Yes  Home environment has been reviewed From home  Prior level of function: Independent  Prior/Current Home Services No current home services  Social Drivers of Health Review SDOH reviewed no interventions necessary  Readmission risk has been reviewed Yes  Transition of care needs no transition of care needs at this time   Transition of Care Department [facility]) has reviewed patient and no other TOC needs have been identified at this time. We will continue to monitor patient advancement through interdisciplinary progression rounds. If new patient needs arise, please place a TOC consult.

## 2023-10-21 NOTE — ED Provider Notes (Signed)
 [place] EMERGENCY DEPARTMENT AT [facility] Provider Note   CSN: [id number] Arrival date & time: 10/21/23  1448     History  Chief Complaint  Patient presents with   Weakness    Tanitra Koehler is a 58 y.o. female.   Weakness Associated symptoms: dizziness   Patient presents for strokelike symptoms.  Medical history includes GERD, IBS, TIA, CKD, HLD.  She describes TIA as occurring 3 years ago.  At the time, she did have transient left-sided symptoms.  Per chart review, MRI at the time was negative.  Patient states that she was in her normal state of health earlier today.  While eating lunch, at around 1:30 PM, she experienced some dizziness.  This continued as they were leaving the restaurant.  She asked her husband to drive for her.  While in the car, she developed left arm paresthesias and weakness.  She describes mild weakness to left leg.  The symptoms are currently ongoing.     Home Medications Prior to Admission medications   Medication Sig Start Date End Date Taking? Authorizing Provider  Dextromethorphan HBr (DELSYM PO) Take 30 mLs by mouth 2 (two) times daily as needed (cough).   Yes [provider]  EPINEPHrine  0.3 mg/0.3 mL IJ SOAJ injection Inject 0.3 mg into the muscle as needed for anaphylaxis. 03/10/23  Yes Zelaya, Oscar A, PA-C  famotidine  (PEPCID ) 40 MG tablet Take 1 tablet (40 mg total) by mouth 2 (two) times daily as needed for heartburn/reflux. 06/24/23  Yes McGowen, Minetta Aly, MD  levocetirizine (XYZAL ) 5 MG tablet Take 1 tablet (5 mg total) by mouth every evening. 09/02/23  Yes Lendia Quay A, NP  lisdexamfetamine (VYVANSE ) 40 MG capsule Take 1 capsule (40 mg total) by mouth every morning. 09/19/23  Yes Milian, Winda Hastings, FNP  lisinopril  (ZESTRIL ) 5 MG tablet Take 0.5 tablets (2.5 mg total) by mouth daily. 07/22/23  Yes Milian, Winda Hastings, FNP  methocarbamol  (ROBAXIN -750) 750 MG tablet Take 1 tablet (750 mg total) by mouth 4 (four)  times daily. Patient taking differently: Take 750 mg by mouth at bedtime as needed for muscle spasms. 09/19/23  Yes Milian, Winda Hastings, FNP  montelukast  (SINGULAIR ) 10 MG tablet Take 1 tablet (10 mg total) by mouth at bedtime. 09/03/23  Yes Milian, Winda Hastings, FNP  Multiple Vitamins-Minerals (ADULT GUMMY) CHEW Chew 1 capsule by mouth daily.   Yes [provider]  promethazine -dextromethorphan (PROMETHAZINE -DM) 6.25-15 MG/5ML syrup Take 5 mLs by mouth 4 (four) times daily as needed. Patient taking differently: Take 5 mLs by mouth 4 (four) times daily as needed for cough. 10/17/23  Yes Milian, Winda Hastings, FNP  UNABLE TO FIND Asparagus drops-daily in water; Liver Life-10 drops daily   Yes [provider]  venlafaxine  XR (EFFEXOR  XR) 37.5 MG 24 hr capsule Take 1 capsule (37.5 mg total) by mouth daily with breakfast. 04/28/23  Yes McGowen, Minetta Aly, MD  VITAMIN D-VITAMIN K  PO Take 1 capsule by mouth daily.   Yes [provider]      Allergies    Bee venom, Flagyl  [metronidazole ], Iodine, Shellfish allergy, Tramadol  hcl, Wasp venom, Lactose intolerance (gi), Gluten meal, and Melatonin    Review of Systems   Review of Systems  Neurological:  Positive for dizziness, weakness and numbness.  All other systems reviewed and are negative.   Physical Exam Updated Vital Signs BP (!) 148/94   Pulse 75   Temp 97.6 F (36.4 C) (Oral)   Resp 13  Ht 5\' 10"  (1.778 m)   Wt 70.8 kg   SpO2 100%   BMI 22.40 kg/m  Physical Exam Vitals and nursing note reviewed.  Constitutional:      General: She is not in acute distress.    Appearance: Normal appearance. She is well-developed. She is not ill-appearing, toxic-appearing or diaphoretic.  HENT:     Head: Normocephalic and atraumatic.     Right Ear: External ear normal.     Left Ear: External ear normal.     Nose: Nose normal.     Mouth/Throat:     Mouth: Mucous membranes are moist.  Eyes:     General: No visual  field deficit.    Extraocular Movements: Extraocular movements intact.     Conjunctiva/sclera: Conjunctivae normal.  Cardiovascular:     Rate and Rhythm: Normal rate and regular rhythm.  Pulmonary:     Effort: Pulmonary effort is normal. No respiratory distress.  Abdominal:     General: There is no distension.     Palpations: Abdomen is soft.     Tenderness: There is no abdominal tenderness.  Musculoskeletal:        General: No swelling.     Cervical back: Normal range of motion and neck supple.  Skin:    General: Skin is warm and dry.  Neurological:     Mental Status: She is alert.     Cranial Nerves: No dysarthria or facial asymmetry.     Sensory: Sensory deficit present.     Motor: Weakness and pronator drift present. No abnormal muscle tone.     Coordination: Finger-Nose-Finger Test abnormal.     Comments: Very slight pronator drift of left arm and leg; patient endorses diminished sensation to left face, arm, leg.  Difficulty with left arm finger-to-nose. NIHSS 4.  Psychiatric:        Mood and Affect: Mood normal.        Behavior: Behavior normal.     ED Results / Procedures / Treatments   Labs (all labs ordered are listed, but only abnormal results are displayed) Labs Reviewed  COMPREHENSIVE METABOLIC PANEL WITH GFR - Abnormal; Notable for the following components:      Result Value   Creatinine, Ser 1.78 (*)    AST 65 (*)    GFR, Estimated 32 (*)    All other components within normal limits  PROTIME-INR  APTT  CBC  DIFFERENTIAL  ETHANOL  RAPID URINE DRUG SCREEN, HOSP PERFORMED  CBG MONITORING, ED  I-STAT CHEM 8, ED  POC URINE PREG, ED  CBG MONITORING, ED  CBG MONITORING, ED    EKG EKG Interpretation Date/Time:  Tuesday Oct 21 2023 14:57:32 EDT Ventricular Rate:  72 PR Interval:  122 QRS Duration:  74 QT Interval:  424 QTC Calculation: 464 R Axis:   74  Text Interpretation: Normal sinus rhythm Nonspecific ST abnormality Confirmed by Iva Mariner ([id number])  on 10/21/2023 5:03:47 PM  Radiology MR BRAIN WO CONTRAST Result Date: 10/21/2023 CLINICAL DATA:  Neuro deficit, acute, stroke suspected. EXAM: MRI HEAD WITHOUT CONTRAST TECHNIQUE: Multiplanar, multiecho pulse sequences of the brain and surrounding structures were obtained without intravenous contrast. COMPARISON:  Head CT immediately prior FINDINGS: Brain: The brain has a normal appearance without evidence of malformation, atrophy, old or acute small or large vessel infarction, mass lesion, hemorrhage, hydrocephalus or extra-axial collection. Vascular: Major vessels at the base of the brain show flow. Venous sinuses appear patent. Skull and upper cervical spine: Normal. Sinuses/Orbits: Clear/normal. Other: None  significant. IMPRESSION: Normal examination. No abnormality seen to explain the clinical presentation. Electronically Signed   By: Bettylou Brunner M.D.   On: 10/21/2023 16:58   CT HEAD CODE STROKE WO CONTRAST Result Date: 10/21/2023 CLINICAL DATA:  Code stroke. Neuro deficit, acute, stroke suspected. Dizziness. Numbness. EXAM: CT HEAD WITHOUT CONTRAST TECHNIQUE: Contiguous axial images were obtained from the base of the skull through the vertex without intravenous contrast. RADIATION DOSE REDUCTION: This exam was performed according to the departmental dose-optimization program which includes automated exposure control, adjustment of the mA and/or kV according to patient size and/or use of iterative reconstruction technique. COMPARISON:  MRI 10/20/2020 FINDINGS: Brain: The brain shows a normal appearance without evidence of malformation, atrophy, old or acute small or large vessel infarction, mass lesion, hemorrhage, hydrocephalus or extra-axial collection. Vascular: No hyperdense vessel. No evidence of atherosclerotic calcification. Skull: Normal.  No traumatic finding.  No focal bone lesion. Sinuses/Orbits: Sinuses are clear. Orbits appear normal. Mastoids are clear. Other: None significant ASPECTS ([place]  Stroke Program Early CT Score) - Ganglionic level infarction (caudate, lentiform nuclei, internal capsule, insula, M1-M3 cortex): 7 - Supraganglionic infarction (M4-M6 cortex): 3 Total score (0-10 with 10 being normal): 10 IMPRESSION: 1. Normal head CT. 2. Aspects is 10. 3. These results were called by telephone at the time of interpretation on 10/21/2023 at 3:38 pm to provider Iva Mariner , who verbally acknowledged these results. Electronically Signed   By: Bettylou Brunner M.D.   On: 10/21/2023 15:39    Procedures Procedures    Medications Ordered in ED Medications  lactated ringers  bolus 1,000 mL (has no administration in time range)  meclizine (ANTIVERT) tablet 25 mg (has no administration in time range)    ED Course/ Medical Decision Making/ A&P                                 Medical Decision Making Amount and/or Complexity of Data Reviewed Labs: ordered. Radiology: ordered.  Risk Decision regarding hospitalization.   This patient presents to the ED for concern of strokelike symptoms, this involves an extensive number of treatment options, and is a complaint that carries with it a high risk of complications and morbidity.  The differential diagnosis includes CVA, TIA, seizure, neoplasm, anxiety, metabolic derangement   Co morbidities that complicate the patient evaluation  GERD, IBS, TIA, CKD, HLD   Additional history obtained:  Additional history obtained from N/A External records from outside source obtained and reviewed including EMR   Lab Tests:  I Ordered, and personally interpreted labs.  The pertinent results include: AKI is present.  Lab work is otherwise unremarkable.   Imaging Studies ordered:  I ordered imaging studies including CT head, MRI brain I independently visualized and interpreted imaging which showed no acute findings I agree with the radiologist interpretation   Cardiac Monitoring: / EKG:  The patient was maintained on a cardiac monitor.  I  personally viewed and interpreted the cardiac monitored which showed an underlying rhythm of: Sinus rhythm   Problem List / ED Course / Critical interventions / Medication management  Patient presenting for strokelike symptoms.  Onset was 2 hours prior to arrival.  On exam, she does have some mild left-sided deficits.  Code stroke was initiated.  Noncontrasted CT scan of head did not show any acute findings.  I spoke with neurologist on-call, Dr. Doretta Gant, who recommends ED observation with every 30 minute neurochecks up until patient  is outside of TNKase window.  After that, patient to be admitted for further stroke workup and observation.  Patient underwent MRI which did not show acute findings.  Her lab work is notable for AKI.  This may explain her symptoms of dizziness.  IV fluids were ordered for hydration.  Meclizine was ordered for symptomatic relief.  Patient had some mild improvement in her weakness and tingling while in the ED.  She is agreeable to admission.  She was admitted for further management. I ordered medication including IV fluids for hydration; meclizine for dizziness Reevaluation of the patient after these medicines showed that the patient improved I have reviewed the patients home medicines and have made adjustments as needed   Consultations Obtained:  I requested consultation with the neurologist, Dr. Doretta Gant,  and discussed lab and imaging findings as well as pertinent plan - they recommend: Admission to [facility] for observation and stroke workup   Social Determinants of Health:  Lives independently        Final Clinical Impression(s) / ED Diagnoses Final diagnoses:  Stroke-like symptoms    Rx / DC Orders ED Discharge Orders     None         Iva Mariner, MD 10/21/23 [phone]

## 2023-10-21 NOTE — Consult Note (Signed)
 NEUROLOGY TELECONSULTATION NOTE   Date of service: Oct 21, 2023 Patient Name: Candace Allen MRN:  [id number] DOB:  12-26-1965 Reason for consult: stroke code L sided numbness  Requesting Provider: Iva Mariner MD Consult Participants: myself, patient, bedside RN, telestroke RN Location of the provider: [facility] Location of the patient: [facility]  This consult was provided via telemedicine with 2-way video and audio communication. The patient/family was informed that care would be provided in this way and agreed to receive care in this manner.   _ _ _   _ __   _ __ _ _  __ __   _ __   __ _  History of Present Illness   This is a 58 year old woman with past medical history significant for CKD stage III, history of endometrial cancer, hypothyroidism, IBS, TIA 2 years ago who presents with numbness and tingling of her left side.  Last known well was 1:00pm.  She was sitting with her sister eating lunch and she stood up and felt lightheaded (not room spinning).  At this time she developed left-sided numbness and tingling which is slightly improved but not resolved.  She has no other neurologic deficits.  NIH stroke scale is 1 for sensory deficit.  CT head personally reviewed showed no acute process.  Symptoms were deemed too mild to treat with TNK but she was recommended to stay in ED with every 30 minute neurochecks and stroke scale until 5:30 PM at which point she would be outside the window, followed by hobs admission for stroke workup.   ROS   Per HPI; all other systems reviewed and are negative  Past History   The following was personally reviewed:  Past Medical History:  Diagnosis Date   Attention deficit disorder (ADD) in adult    Chronic renal insufficiency, stage 3 (moderate) (HCC)    ?d/t dexilant   Diverticulosis 02/19/2018   By colonoscopy 2016   Endometrial cancer (HCC)    GERD (gastroesophageal reflux disease)    Hx of concussion    Approximately 2016   Hypothyroidism    question  of-->no hx of thyroid  lab abnormalities in record   Hypothyroidism (acquired) 08/20/2019   IBS (irritable bowel syndrome)    Past Surgical History:  Procedure Laterality Date   CHOLECYSTECTOMY N/A 11/08/2021   Procedure: LAPAROSCOPIC CHOLECYSTECTOMY;  Surgeon: Marijo Shove, DO;  Location: [facility];  Service: General;  Laterality: N/A;   COLONOSCOPY  2016   2016 no polyps. Rpt at [facility] 2022 per pt, normal, no record available   ENDOMETRIAL ABLATION     SHOULDER SURGERY Left    Approximately 2016   TRANSTHORACIC ECHOCARDIOGRAM     01/2021 NORMAL   TUBAL LIGATION     UPPER GASTROINTESTINAL ENDOSCOPY  11/10/2019   neg   Family History  Problem Relation Age of Onset   Peripheral Artery Disease Mother    Stroke Mother    Other Father        Hx unknown to patient   Healthy Daughter    Hypotension Maternal Grandmother    Aortic dissection Maternal Grandmother    Heart disease Maternal Grandfather    Diabetes Paternal Grandmother        AODM   Diabetes Paternal Grandfather        AODM   Testicular cancer Brother    Esophageal cancer Brother    Early death Brother    Healthy Son    Ovarian cancer Paternal Great-grandmother    Breast cancer Neg  Hx    Social History   Socioeconomic History   Marital status: Married    Spouse name: Not on file   Number of children: 2   Years of education: Not on file   Highest education level: Associate degree: occupational, Scientist, product/process development, or vocational program  Occupational History   Occupation: Oceanographer  Tobacco Use   Smoking status: Never    Passive exposure: Never   Smokeless tobacco: Never  Vaping Use   Vaping status: Never Used  Substance and Sexual Activity   Alcohol use: Yes    Alcohol/week: 2.0 standard drinks of alcohol    Types: 2 Standard drinks or equivalent per week    Comment: social   Drug use: Never   Sexual activity: Yes    Birth control/protection: Surgical    Comment: tubal & ablation  Other Topics Concern    Not on file  Social History Narrative   Married, 2 children   Educ: 12+.   Grew up in [place].  [place] area approximately [place].   Occup: publisher->[employer]   No tob   Social Drivers of Corporate investment banker Strain: Low Risk  (09/02/2023)   Overall Financial Resource Strain (CARDIA)    Difficulty of Paying Living Expenses: Not very hard  Food Insecurity: No Food Insecurity (09/02/2023)   Hunger Vital Sign    Worried About Running Out of Food in the Last Year: Never true    Ran Out of Food in the Last Year: Never true  Transportation Needs: No Transportation Needs (09/02/2023)   PRAPARE - Administrator, Civil Service (Medical): No    Lack of Transportation (Non-Medical): No  Physical Activity: Sufficiently Active (09/02/2023)   Exercise Vital Sign    Days of Exercise per Week: 5 days    Minutes of Exercise per Session: 90 min  Stress: Stress Concern Present (09/02/2023)   [employer] of Occupational Health - Occupational Stress Questionnaire    Feeling of Stress : To some extent  Social Connections: Moderately Integrated (09/02/2023)   Social Connection and Isolation Panel [NHANES]    Frequency of Communication with Friends and Family: More than three times a week    Frequency of Social Gatherings with Friends and Family: Once a week    Attends Religious Services: Never    Database administrator or Organizations: Yes    Attends Engineer, structural: More than 4 times per year    Marital Status: Married   Allergies  Allergen Reactions   Bee Venom Anaphylaxis   Flagyl  [Metronidazole ] Anaphylaxis   Iodine Anaphylaxis   Shellfish Allergy Anaphylaxis   Tramadol  Hcl Other (See Comments)    Showed signs of stroke   Wasp Venom Anaphylaxis   Lactose Intolerance (Gi) Hives    Bloating   Gluten Meal Other (See Comments)    Bloating    Melatonin Other (See Comments)    Nightmares     Medications   (Not in a hospital  admission)     Current Facility-Administered Medications:    dextrose  5 % solution, , Intravenous, Continuous, Armbruster, Lendon Queen, MD  Current Outpatient Medications:    EPINEPHrine  0.3 mg/0.3 mL IJ SOAJ injection, Inject 0.3 mg into the muscle as needed for anaphylaxis., Disp: 1 each, Rfl: 0   famotidine  (PEPCID ) 40 MG tablet, Take 1 tablet (40 mg total) by mouth 2 (two) times daily as needed for heartburn/reflux., Disp: 180 tablet, Rfl: 0   levocetirizine (XYZAL ) 5 MG tablet,  Take 1 tablet (5 mg total) by mouth every evening., Disp: 30 tablet, Rfl: 1   linaclotide  (LINZESS ) 290 MCG CAPS capsule, Take 1 capsule (290 mcg total) by mouth daily. (Patient taking differently: Take 290 mcg by mouth as needed.), Disp: 90 capsule, Rfl: 3   lisdexamfetamine (VYVANSE ) 40 MG capsule, Take 1 capsule (40 mg total) by mouth every morning., Disp: 30 capsule, Rfl: 0   lisinopril  (ZESTRIL ) 5 MG tablet, Take 0.5 tablets (2.5 mg total) by mouth daily., Disp: 90 tablet, Rfl: 0   methocarbamol  (ROBAXIN -750) 750 MG tablet, Take 1 tablet (750 mg total) by mouth 4 (four) times daily., Disp: 30 tablet, Rfl: 0   montelukast  (SINGULAIR ) 10 MG tablet, Take 1 tablet (10 mg total) by mouth at bedtime., Disp: 30 tablet, Rfl: 3   Multiple Vitamins-Minerals (ADULT GUMMY) CHEW, Chew 1 capsule by mouth daily., Disp: , Rfl:    ondansetron  (ZOFRAN -ODT) 8 MG disintegrating tablet, Take 8 mg by mouth every 8 (eight) hours as needed., Disp: , Rfl:    promethazine -dextromethorphan (PROMETHAZINE -DM) 6.25-15 MG/5ML syrup, Take 5 mLs by mouth 4 (four) times daily as needed., Disp: 118 mL, Rfl: 0   UNABLE TO FIND, Asparagus drops-daily in water; Liver Life-10 drops daily, Disp: , Rfl:    venlafaxine  XR (EFFEXOR  XR) 37.5 MG 24 hr capsule, Take 1 capsule (37.5 mg total) by mouth daily with breakfast., Disp: 90 capsule, Rfl: 0   VITAMIN D-VITAMIN K  PO, Take 1 capsule by mouth daily., Disp: , Rfl:   Vitals   Vitals:   10/21/23 1453  10/21/23 1458 10/21/23 1515  BP:  (!) 158/96 (!) 145/90  Pulse:  69 76  Resp:  18 17  Temp:  97.6 F (36.4 C)   TempSrc:  Oral   SpO2:  100% 100%  Weight: 70.8 kg    Height: 5\' 10"  (1.778 m)       Body mass index is 22.4 kg/m.  Physical Exam   Exam performed over telemedicine with 2-way video and audio communication and with assistance of bedside RN  Physical Exam Gen: A&O x4, NAD Resp: normal WOB CV: extremities appear well-perfused  Neuro: *MS: A&O x4. Follows multi-step commands.  *Speech: nondysarthric, no aphasia, able to name and repeat *CN: PERRL 3mm, EOMI, VFF by confrontation, sensation impaired L face, smile symmetric, hearing intact to voice *Motor:   Normal bulk.  No tremor, rigidity or bradykinesia. No pronator drift. All extremities appear full-strength and symmetric. *Sensory: Sensory deficit LUE only *Coordination:  Finger-to-nose, heel-to-shin, rapid alternating motions were intact. *Reflexes:  UTA 2/2 tele-exam *Gait: deferred  NIHSS = 1 for sensory deficit   Premorbid mRS = 0    Labs   CBC:  Recent Labs  Lab 10/21/23 1530  WBC 6.0  NEUTROABS 2.9  HGB 12.6  HCT 38.6  MCV 96.0  PLT 261    Basic Metabolic Panel:  Lab Results  Component Value Date   NA 139 07/17/2023   K 4.5 07/17/2023   CO2 26 07/17/2023   GLUCOSE 94 07/17/2023   BUN 14 07/17/2023   CREATININE 1.20 (H) 07/17/2023   CALCIUM 10.0 07/17/2023   GFRNONAA 53 (L) 10/25/2021   GFRAA >60 02/20/2018   Lipid Panel:  Lab Results  Component Value Date   LDLCALC 141 (H) 07/17/2023   HgbA1c:  Lab Results  Component Value Date   HGBA1C 5.2 10/21/2020   Urine Drug Screen: No results found for: "LABOPIA", "COCAINSCRNUR", "LABBENZ", "AMPHETMU", "THCU", "LABBARB"  Alcohol Level  Component Value Date/Time   [facility] <15 10/21/2023 1530    CT Head without contrast: No acute process personal review  Impression   This is a 58 year old woman with past medical history  significant for CKD stage III, history of endometrial cancer, hypothyroidism, IBS, TIA 2 years ago who presents with numbness and tingling of her left side.  Last known well was 1:00pm.  NIH stroke scale is 1 for sensory deficit.  CT head personally reviewed showed no acute process.  Symptoms were deemed too mild to treat with TNK.   Recommendations   - Recommend close monitoring in ED with vital signs and NIHSS both q 30 min until outside of the TNK window at 1730.  If neurologic exam is worsens a stroke code should be immediately reactivated.  If exam is stable or improved at 1730 pt should at that point be admitted to the hospitalist service at [facility] for stroke/TIA work-up.  After admission: - Permissive HTN x48 hrs from sx onset or until stroke ruled out by MRI goal BP <220/120. PRN labetalol or hydralazine  if BP above these parameters. Avoid oral antihypertensives. - MRI brain wo contrast - MRA H&N (patient allergic to iodinated contrast) - TTE w/ bubble - Check A1c and LDL + add statin per guidelines - ASA 81mg  daily + plavix 75mg  daily x21 days f/b ASA 81mg  daily monotherapy after that - q4 hr neuro checks - STAT head CT for any change in neuro exam - Tele - PT/OT/SLP - Stroke education - Amb referral to neurology upon discharge   Patient is considering leaving AMA. If she does, at a minimum, she should be told to take a daily aspirin  and get STAT referral to outpatient neurology for further workup. ______________________________________________________________________   Thank you for the opportunity to take part in the care of this patient. If you have any further questions, please contact the neurology consultation attending.  Signed,  Greg Leaks, MD [facility] [phone]  If 7pm- 7am, please page neurology on call as listed in AMION.  **Any copied and pasted documentation in this note was written by me in another application not billed for and pasted by me  into this document.

## 2023-10-21 NOTE — H&P (Signed)
 History and Physical    Candace Allen QIH:[id number] DOB: 05-27-1966 DOA: 10/21/2023  PCP: Chrystine Crate, FNP Patient coming from: home  Chief Complaint: Dizziness  HPI: Candace Allen is a 58 y.o. female with medical history significant of Endometrial ca, TIA, hypothyroidism, IBS, GERD.   Pt presenting after acute onset Dizziness. Described as both feleing lightheaded and w/ intermittent room spinning. Some time after initial dizziness pt also developed change in sensation to L side of face, L arm and L foot. Arm and foot have resolved since initial eval in ED. Dizziness has also improved. Reports being in her nml state of health prior to ED presentaiton.      ED Course: Workup as below. No CVA noted. Neuro consulted and recommending overnight obs.   Review of Systems: As per HPI otherwise all other systems reviewed and are negative  Ambulatory Status:no restricitons  Past Medical History:  Diagnosis Date   Attention deficit disorder (ADD) in adult    Chronic renal insufficiency, stage 3 (moderate) (HCC)    ?d/t dexilant   Diverticulosis 02/19/2018   By colonoscopy 2016   Endometrial cancer (HCC)    GERD (gastroesophageal reflux disease)    Hx of concussion    Approximately 2016   Hypothyroidism    question of-->no hx of thyroid  lab abnormalities in record   Hypothyroidism (acquired) 08/20/2019   IBS (irritable bowel syndrome)     Past Surgical History:  Procedure Laterality Date   CHOLECYSTECTOMY N/A 11/08/2021   Procedure: LAPAROSCOPIC CHOLECYSTECTOMY;  Surgeon: Marijo Shove, DO;  Location: [facility];  Service: General;  Laterality: N/A;   COLONOSCOPY  2016   2016 no polyps. Rpt at [facility] 2022 per pt, normal, no record available   ENDOMETRIAL ABLATION     SHOULDER SURGERY Left    Approximately 2016   TRANSTHORACIC ECHOCARDIOGRAM     01/2021 NORMAL   TUBAL LIGATION     UPPER GASTROINTESTINAL ENDOSCOPY  11/10/2019   neg    Social History    Socioeconomic History   Marital status: Married    Spouse name: Not on file   Number of children: 2   Years of education: Not on file   Highest education level: Associate degree: occupational, Scientist, product/process development, or vocational program  Occupational History   Occupation: Oceanographer  Tobacco Use   Smoking status: Never    Passive exposure: Never   Smokeless tobacco: Never  Vaping Use   Vaping status: Never Used  Substance and Sexual Activity   Alcohol use: Yes    Alcohol/week: 2.0 standard drinks of alcohol    Types: 2 Standard drinks or equivalent per week    Comment: social   Drug use: Never   Sexual activity: Yes    Birth control/protection: Surgical    Comment: tubal & ablation  Other Topics Concern   Not on file  Social History Narrative   Married, 2 children   Educ: 12+.   Grew up in [place].  [place] area approximately 2016.   Occup: publisher->[employer]   No tob   Social Drivers of Corporate investment banker Strain: Low Risk  (09/02/2023)   Overall Financial Resource Strain (CARDIA)    Difficulty of Paying Living Expenses: Not very hard  Food Insecurity: No Food Insecurity (10/21/2023)   Hunger Vital Sign    Worried About Running Out of Food in the Last Year: Never true    Ran Out of Food in the Last Year: Never true  Transportation  Needs: No Transportation Needs (10/21/2023)   PRAPARE - Administrator, Civil Service (Medical): No    Lack of Transportation (Non-Medical): No  Physical Activity: Sufficiently Active (09/02/2023)   Exercise Vital Sign    Days of Exercise per Week: 5 days    Minutes of Exercise per Session: 90 min  Stress: Stress Concern Present (09/02/2023)   [employer] of Occupational Health - Occupational Stress Questionnaire    Feeling of Stress : To some extent  Social Connections: Moderately Integrated (09/02/2023)   Social Connection and Isolation Panel [NHANES]    Frequency of Communication with Friends and  Family: More than three times a week    Frequency of Social Gatherings with Friends and Family: Once a week    Attends Religious Services: Never    Database administrator or Organizations: Yes    Attends Engineer, structural: More than 4 times per year    Marital Status: Married  Catering manager Violence: Not At Risk (10/21/2023)   Humiliation, Afraid, Rape, and Kick questionnaire    Fear of Current or Ex-Partner: No    Emotionally Abused: No    Physically Abused: No    Sexually Abused: No    Allergies  Allergen Reactions   Bee Venom Anaphylaxis   Flagyl  [Metronidazole ] Anaphylaxis   Iodine Anaphylaxis   Shellfish Allergy Anaphylaxis   Tramadol  Hcl Other (See Comments)    Showed signs of stroke   Wasp Venom Anaphylaxis   Lactose Intolerance (Gi) Hives    Bloating   Gluten Meal Other (See Comments)    Bloating    Melatonin Other (See Comments)    Nightmares     Family History  Problem Relation Age of Onset   Peripheral Artery Disease Mother    Stroke Mother    Other Father        Hx unknown to patient   Healthy Daughter    Hypotension Maternal Grandmother    Aortic dissection Maternal Grandmother    Heart disease Maternal Grandfather    Diabetes Paternal Grandmother        AODM   Diabetes Paternal Grandfather        AODM   Testicular cancer Brother    Esophageal cancer Brother    Early death Brother    Healthy Son    Ovarian cancer Paternal Great-grandmother    Breast cancer Neg Hx       Prior to Admission medications   Medication Sig Start Date End Date Taking? Authorizing Provider  Dextromethorphan HBr (DELSYM PO) Take 30 mLs by mouth 2 (two) times daily as needed (cough).   Yes [provider]  EPINEPHrine  0.3 mg/0.3 mL IJ SOAJ injection Inject 0.3 mg into the muscle as needed for anaphylaxis. 03/10/23  Yes Zelaya, Oscar A, PA-C  famotidine  (PEPCID ) 40 MG tablet Take 1 tablet (40 mg total) by mouth 2 (two) times daily as needed for  heartburn/reflux. 06/24/23  Yes McGowen, Minetta Aly, MD  levocetirizine (XYZAL ) 5 MG tablet Take 1 tablet (5 mg total) by mouth every evening. 09/02/23  Yes Lendia Quay A, NP  lisdexamfetamine (VYVANSE ) 40 MG capsule Take 1 capsule (40 mg total) by mouth every morning. 09/19/23  Yes Milian, Winda Hastings, FNP  lisinopril  (ZESTRIL ) 5 MG tablet Take 0.5 tablets (2.5 mg total) by mouth daily. 07/22/23  Yes Milian, Winda Hastings, FNP  methocarbamol  (ROBAXIN -750) 750 MG tablet Take 1 tablet (750 mg total) by mouth 4 (four) times daily. Patient taking differently:  Take 750 mg by mouth at bedtime as needed for muscle spasms. 09/19/23  Yes Milian, Winda Hastings, FNP  montelukast  (SINGULAIR ) 10 MG tablet Take 1 tablet (10 mg total) by mouth at bedtime. 09/03/23  Yes Milian, Winda Hastings, FNP  Multiple Vitamins-Minerals (ADULT GUMMY) CHEW Chew 1 capsule by mouth daily.   Yes [provider]  promethazine -dextromethorphan (PROMETHAZINE -DM) 6.25-15 MG/5ML syrup Take 5 mLs by mouth 4 (four) times daily as needed. Patient taking differently: Take 5 mLs by mouth 4 (four) times daily as needed for cough. 10/17/23  Yes Milian, Winda Hastings, FNP  UNABLE TO FIND Asparagus drops-daily in water; Liver Life-10 drops daily   Yes [provider]  venlafaxine  XR (EFFEXOR  XR) 37.5 MG 24 hr capsule Take 1 capsule (37.5 mg total) by mouth daily with breakfast. 04/28/23  Yes McGowen, Minetta Aly, MD  VITAMIN D-VITAMIN K  PO Take 1 capsule by mouth daily.   Yes [provider]    Physical Exam: Vitals:   10/21/23 1458 10/21/23 1515 10/21/23 1600 10/21/23 1856  BP: (!) 158/96 (!) 145/90 (!) 148/94 (!) 149/87  Pulse: 69 76 75   Resp: 18 17 13    Temp: 97.6 F (36.4 C)   97.8 F (36.6 C)  TempSrc: Oral   Oral  SpO2: 100% 100% 100%   Weight:      Height:         General:  Appears calm and comfortable Eyes:  PERRL, EOMI, normal lids, iris ENT:  grossly normal hearing, lips & tongue,  mmm Neck:  no LAD, masses or thyromegaly Cardiovascular:  RRR, no m/r/g. No LE edema.  Respiratory:  CTA bilaterally, no w/r/r. Normal respiratory effort. Abdomen:  soft, ntnd, NABS Skin:  no rash or induration seen on limited exam Musculoskeletal:  grossly normal tone BUE/BLE, good ROM, no bony abnormality Psychiatric:  grossly normal mood and affect, speech fluent and appropriate, AOx3 Neurologic:  CN 2-12 grossly intact, moves all extremities in coordinated fashion, sensation intact other than slight decrease in L upper extremity.   Labs on Admission: I have personally reviewed following labs and imaging studies  CBC: Recent Labs  Lab 10/21/23 1530  WBC 6.0  NEUTROABS 2.9  HGB 12.6  HCT 38.6  MCV 96.0  PLT 261   Basic Metabolic Panel: Recent Labs  Lab 10/21/23 1530  NA 136  K 3.5  CL 101  CO2 28  GLUCOSE 88  BUN 14  CREATININE 1.78*  CALCIUM 9.4   GFR: Estimated Creatinine Clearance: 36.8 mL/min (A) (by C-G formula based on SCr of 1.78 mg/dL (H)). Liver Function Tests: Recent Labs  Lab 10/21/23 1530  AST 65*  ALT 43  ALKPHOS 83  BILITOT 0.6  PROT 7.0  ALBUMIN 4.3   No results for input(s): "LIPASE", "AMYLASE" in the last 168 hours. No results for input(s): "AMMONIA" in the last 168 hours. Coagulation Profile: Recent Labs  Lab 10/21/23 1530  INR 0.9   Cardiac Enzymes: No results for input(s): "CKTOTAL", "CKMB", "CKMBINDEX", "TROPONINI" in the last 168 hours. BNP (last 3 results) No results for input(s): "PROBNP" in the last 8760 hours. HbA1C: No results for input(s): "HGBA1C" in the last 72 hours. CBG: Recent Labs  Lab 10/21/23 1452 10/21/23 1516 10/21/23 1528  GLUCAP 84 80 85   Lipid Profile: No results for input(s): "CHOL", "HDL", "LDLCALC", "TRIG", "CHOLHDL", "LDLDIRECT" in the last 72 hours. Thyroid  Function Tests: No results for input(s): "TSH", "T4TOTAL", "FREET4", "T3FREE", "THYROIDAB" in the last 72 hours. Anemia Panel:  No results  for input(s): "VITAMINB12", "FOLATE", "FERRITIN", "TIBC", "IRON", "RETICCTPCT" in the last 72 hours. Urine analysis:    Component Value Date/Time   COLORURINE COLORLESS (A) 10/25/2021 1815   APPEARANCEUR CLEAR 10/25/2021 1815   LABSPEC 1.003 (L) 10/25/2021 1815   PHURINE 8.0 10/25/2021 1815   GLUCOSEU Small (1+) (A) 11/06/2022 1400   GLUCOSEU NEGATIVE 10/25/2021 1815   HGBUR NEGATIVE 10/25/2021 1815   BILIRUBINUR negative 12/31/2022 0908   KETONESUR NEGATIVE 10/25/2021 1815   PROTEINUR Negative 12/31/2022 0908   PROTEINUR NEGATIVE 10/25/2021 1815   UROBILINOGEN 0.2 12/31/2022 0908   NITRITE negative 12/31/2022 0908   NITRITE NEGATIVE 10/25/2021 1815   LEUKOCYTESUR Negative 12/31/2022 0908   LEUKOCYTESUR NEGATIVE 10/25/2021 1815    Creatinine Clearance: Estimated Creatinine Clearance: 36.8 mL/min (A) (by C-G formula based on SCr of 1.78 mg/dL (H)).  Sepsis Labs: @LABRCNTIP (procalcitonin:4,lacticidven:4) )No results found for this or any previous visit (from the past 240 hours).   Radiological Exams on Admission: MR BRAIN WO CONTRAST Result Date: 10/21/2023 CLINICAL DATA:  Neuro deficit, acute, stroke suspected. EXAM: MRI HEAD WITHOUT CONTRAST TECHNIQUE: Multiplanar, multiecho pulse sequences of the brain and surrounding structures were obtained without intravenous contrast. COMPARISON:  Head CT immediately prior FINDINGS: Brain: The brain has a normal appearance without evidence of malformation, atrophy, old or acute small or large vessel infarction, mass lesion, hemorrhage, hydrocephalus or extra-axial collection. Vascular: Major vessels at the base of the brain show flow. Venous sinuses appear patent. Skull and upper cervical spine: Normal. Sinuses/Orbits: Clear/normal. Other: None significant. IMPRESSION: Normal examination. No abnormality seen to explain the clinical presentation. Electronically Signed   By: Bettylou Brunner M.D.   On: 10/21/2023 16:58   CT HEAD CODE STROKE WO  CONTRAST Result Date: 10/21/2023 CLINICAL DATA:  Code stroke. Neuro deficit, acute, stroke suspected. Dizziness. Numbness. EXAM: CT HEAD WITHOUT CONTRAST TECHNIQUE: Contiguous axial images were obtained from the base of the skull through the vertex without intravenous contrast. RADIATION DOSE REDUCTION: This exam was performed according to the departmental dose-optimization program which includes automated exposure control, adjustment of the mA and/or kV according to patient size and/or use of iterative reconstruction technique. COMPARISON:  MRI 10/20/2020 FINDINGS: Brain: The brain shows a normal appearance without evidence of malformation, atrophy, old or acute small or large vessel infarction, mass lesion, hemorrhage, hydrocephalus or extra-axial collection. Vascular: No hyperdense vessel. No evidence of atherosclerotic calcification. Skull: Normal.  No traumatic finding.  No focal bone lesion. Sinuses/Orbits: Sinuses are clear. Orbits appear normal. Mastoids are clear. Other: None significant ASPECTS (Alberta Stroke Program Early CT Score) - Ganglionic level infarction (caudate, lentiform nuclei, internal capsule, insula, M1-M3 cortex): 7 - Supraganglionic infarction (M4-M6 cortex): 3 Total score (0-10 with 10 being normal): 10 IMPRESSION: 1. Normal head CT. 2. Aspects is 10. 3. These results were called by telephone at the time of interpretation on 10/21/2023 at 3:38 pm to provider Iva Mariner , who verbally acknowledged these results. Electronically Signed   By: Bettylou Brunner M.D.   On: 10/21/2023 15:39    EKG: Independently reviewed. No ACS. NSR. Non-specific T wave changes.   Assessment/Plan Principal Problem:   Stroke [facility])    TIA: Sx not consistent w/ stroke (L sided change in sensation and possible LUE weakness w/ vertigo) MRI and CT negative for stroke. Suspect may be from dehydration and possible ealry viral process. Neuro checks in ED Q61min for 4 hrs w/ gradual improvement after hydration -  overnight obs on Tele.  - MRA  head and neck.  - TTE w/ bubble - A1c and FLP in am - ASA - f/u Neuro as outpt unless sx persist or positive findings.   AKI: Cr 1.78. baseline 1.18. Likely from dehydration. 1L LR given in ED w/ pt endorsing subjective improvement in sx.   - BMP in am.  - encouraged oral fluid intake.   GERD: - continue H2 blocker  HTN: - hold medications to allow for permissive HTN overnight.     DVT prophylaxis: Lovenox   Code Status: full  Family Communication: none  Disposition Plan: pending overnight obs and MRA  Consults called: Tele- Neuro  Admission status: Obs    Otilia Bloch MD [facility]  If 7PM-7AM, please contact night-coverage [email or web address] Password [facility]  10/21/2023, 9:41 PM

## 2023-10-22 ENCOUNTER — Observation Stay (HOSPITAL_COMMUNITY)

## 2023-10-22 DIAGNOSIS — I639 Cerebral infarction, unspecified: Secondary | ICD-10-CM | POA: Diagnosis not present

## 2023-10-22 DIAGNOSIS — I340 Nonrheumatic mitral (valve) insufficiency: Secondary | ICD-10-CM

## 2023-10-22 DIAGNOSIS — I361 Nonrheumatic tricuspid (valve) insufficiency: Secondary | ICD-10-CM | POA: Diagnosis not present

## 2023-10-22 DIAGNOSIS — R531 Weakness: Secondary | ICD-10-CM | POA: Diagnosis not present

## 2023-10-22 LAB — LIPID PANEL
Cholesterol: 207 mg/dL — ABNORMAL HIGH (ref 0–200)
HDL: 66 mg/dL (ref >40)
LDL Cholesterol: 122 mg/dL — ABNORMAL HIGH (ref 0–99)
Total CHOL/HDL Ratio: 3.1 ratio
Triglycerides: 95 mg/dL (ref <150)
VLDL: 19 mg/dL (ref 0–40)

## 2023-10-22 LAB — BASIC METABOLIC PANEL WITH GFR
Anion gap: 9 (ref 5–15)
BUN: 20 mg/dL (ref 6–20)
CO2: 26 mmol/L (ref 22–32)
Calcium: 9 mg/dL (ref 8.9–10.3)
Chloride: 103 mmol/L (ref 98–111)
Creatinine, Ser: 1.24 mg/dL — ABNORMAL HIGH (ref 0.44–1.00)
GFR, Estimated: 50 mL/min — ABNORMAL LOW (ref >60)
Glucose, Bld: 93 mg/dL (ref 70–99)
Potassium: 3.9 mmol/L (ref 3.5–5.1)
Sodium: 138 mmol/L (ref 135–145)

## 2023-10-22 LAB — HIV ANTIBODY (ROUTINE TESTING W REFLEX): HIV Screen 4th Generation wRfx: NONREACTIVE

## 2023-10-22 LAB — ECHOCARDIOGRAM COMPLETE BUBBLE STUDY
AR max vel: 2.27 cm2
AV Peak grad: 7.6 mmHg
Ao pk vel: 1.38 m/s
Area-P 1/2: 3.58 cm2
MV VTI: 2.73 cm2
S' Lateral: 2.9 cm

## 2023-10-22 LAB — HEMOGLOBIN A1C
Hgb A1c MFr Bld: 5.4 % (ref 4.8–5.6)
Mean Plasma Glucose: 108.28 mg/dL

## 2023-10-22 MED ORDER — CLOPIDOGREL BISULFATE 75 MG PO TABS
75.0000 mg | ORAL_TABLET | Freq: Every day | ORAL | Status: DC
  Filled 2023-10-22: qty 1

## 2023-10-22 MED ORDER — ASPIRIN 81 MG PO TBEC
81.0000 mg | DELAYED_RELEASE_TABLET | Freq: Every day | ORAL | Status: DC
  Administered 2023-10-22: 81 mg via ORAL
  Filled 2023-10-22: qty 1

## 2023-10-22 MED ORDER — ASPIRIN 81 MG PO TBEC: 81.0000 mg | DELAYED_RELEASE_TABLET | Freq: Every day | ORAL | 12 refills | Status: AC

## 2023-10-22 MED ORDER — LACTATED RINGERS IV SOLN: INTRAVENOUS | Status: DC

## 2023-10-22 NOTE — Evaluation (Signed)
 Physical Therapy Evaluation Patient Details Name: Candace Allen MRN: [id number] DOB: 1965-11-29 Today's Date: 10/22/2023  History of Present Illness  Candace Allen is a 58 y.o. female with medical history significant of Endometrial ca, TIA, hypothyroidism, IBS, GERD.      Pt presenting after acute onset Dizziness. Described as both feleing lightheaded and w/ intermittent room spinning. Some time after initial dizziness pt also developed change in sensation to L side of face, L arm and L foot. Arm and foot have resolved since initial eval in ED. Dizziness has also improved. Reports being in her nml state of health prior to ED presentaiton.   Clinical Impression  Patient functioning at baseline for functional mobility and gait other than mild weakness in LUE, otherwise patient demonstrates good return for ambulating in room, hallways without loss of balance, no c/o dizziness and no need for an AD. Plan:  Patient discharged from physical therapy to care of nursing for ambulation daily as tolerated for length of stay.          If plan is discharge home, recommend the following: Other (comment) (patient at baseline)   Can travel by private vehicle        Equipment Recommendations None recommended by PT  Recommendations for Other Services       Functional Status Assessment Patient has not had a recent decline in their functional status     Precautions / Restrictions Precautions Precautions: None Restrictions Weight Bearing Restrictions Per Provider Order: No      Mobility  Bed Mobility Overal bed mobility: Independent                  Transfers Overall transfer level: Independent                      Ambulation/Gait Ambulation/Gait assistance: Independent Gait Distance (Feet): 200 Feet Assistive device: None Gait Pattern/deviations: WFL(Within Functional Limits) Gait velocity: normal     General Gait Details: grossly WFL with good return for ambulating in  room, hallways without loss of balance or c/o dizziness  Stairs            Wheelchair Mobility     Tilt Bed    Modified Rankin (Stroke Patients Only)       Balance Overall balance assessment: Independent                                           Pertinent Vitals/Pain Pain Assessment Pain Assessment: Faces Faces Pain Scale: Hurts a little bit Pain Location: back Pain Intervention(s): Monitored during session    Home Living Family/patient expects to be discharged to:: Private residence Living Arrangements: Spouse/significant other Available Help at Discharge: Family Type of Home: House Home Access: Level entry     Alternate Level Stairs-Number of Steps: 18 Home Layout: Two level Home Equipment: None      Prior Function Prior Level of Function : Independent/Modified Independent             Mobility Comments: Community ambulation without AD, drives ADLs Comments: Independent     Extremity/Trunk Assessment   Upper Extremity Assessment Upper Extremity Assessment: Defer to OT evaluation LUE Deficits / Details: Mild L UE weakness compared to R. 4+/5 elbow flexion, elbow extension, wrist flexion, and wrist extension. 5/5 otherwise. LUE Sensation: WNL LUE Coordination: WNL    Lower Extremity Assessment Lower Extremity  Assessment: Overall WFL for tasks assessed    Cervical / Trunk Assessment Cervical / Trunk Assessment: Normal  Communication   Communication Communication: No apparent difficulties    Cognition Arousal: Alert Behavior During Therapy: WFL for tasks assessed/performed   PT - Cognitive impairments: No apparent impairments                         Following commands: Intact       Cueing Cueing Techniques: Verbal cues     General Comments      Exercises     Assessment/Plan    PT Assessment Patient does not need any further PT services  PT Problem List         PT Treatment Interventions       PT Goals (Current goals can be found in the Care Plan section)  Acute Rehab PT Goals Patient Stated Goal: return home PT Goal Formulation: With patient/family Time For Goal Achievement: 10/22/23 Potential to Achieve Goals: Good    Frequency       Co-evaluation PT/OT/SLP Co-Evaluation/Treatment: Yes Reason for Co-Treatment: To address functional/ADL transfers PT goals addressed during session: Mobility/safety with mobility;Balance OT goals addressed during session: ADL's and self-care       AM-PAC PT "6 Clicks" Mobility  Outcome Measure Help needed turning from your back to your side while in a flat bed without using bedrails?: None Help needed moving from lying on your back to sitting on the side of a flat bed without using bedrails?: None Help needed moving to and from a bed to a chair (including a wheelchair)?: None Help needed standing up from a chair using your arms (e.g., wheelchair or bedside chair)?: None Help needed to walk in hospital room?: None Help needed climbing 3-5 steps with a railing? : None 6 Click Score: 24    End of Session   Activity Tolerance: Patient tolerated treatment well Patient left: in bed;with family/visitor present Nurse Communication: Mobility status PT Visit Diagnosis: Unsteadiness on feet (R26.81);Other abnormalities of gait and mobility (R26.89);Muscle weakness (generalized) (M62.81)    Time: 1610-9604 PT Time Calculation (min) (ACUTE ONLY): 12 min   Charges:   PT Evaluation $PT Eval Low Complexity: 1 Low PT Treatments $Therapeutic Activity: 8-22 mins PT General Charges $$ ACUTE PT VISIT: 1 Visit         11:36 AM, 10/22/23 Walton Guppy, MPT Physical Therapist with [facility] [phone] office [phone] mobile phone

## 2023-10-22 NOTE — Discharge Summary (Addendum)
 Physician Discharge Summary   Patient: Candace Allen MRN: [id number] DOB: Nov 08, 1965  Admit date:     10/21/2023  Discharge date: 10/22/23  Discharge Physician: Bobbetta Burnet   PCP: Chrystine Crate, FNP   Recommendations at discharge:    Follow-up with your PCP in 1 week Fasting lipid panel in 6-8 weeks, discussed with PCP regarding treatment options. Follow-up with nephrology for reevaluation of your kidney function currently recommending holding lisinopril  Follow-up with a neurologist in 2-6 weeks (we recommend to continue aspirin  81 mg daily)  Discharge Diagnoses: Principal Problem:   Stroke [facility]) Active Problems:   AKI (acute kidney injury) [facility])    Hospital Course:  Candace Allen is a 58 y.o. female with medical history significant of Endometrial ca, TIA, hypothyroidism, IBS, GERD.    Pt presenting after acute onset Dizziness. Described as both feleing lightheaded and w/ intermittent room spinning. Some time after initial dizziness pt also developed change in sensation to L side of face, L arm and L foot. Arm and foot have resolved since initial eval in ED. Dizziness has also improved. Reports being in her nml state of health prior to ED presentaiton.    TIA-rule out CVA, CT/MRI of the brain were negative Symptoms of left-sided sensory loss-  resolved,  no left-sided weakness, dizziness vertigo also resolved. S/p IV fluid for hydration No acute focal neurological findings-all symptoms resolved - CT/MRI of the brain reviewed all within normal limits Recommended to continue aspirin  81 p.o. daily, avoiding Plavix or any nephrotoxin due to history of CKD    Echo in detail. EJF 60-65%, otherwise within normal limits        History of hypertension - Due to Acute and CKD, commending continue to hold lisinopril , BP stable  GERD-continue Pepcid   Acute on chronic kidney disease Lab Results  Component Value Date   CREATININE 1.24 (H) 10/22/2023   CREATININE  1.78 (H) 10/21/2023   CREATININE 1.20 (H) 07/17/2023   -Ackley stage IIa, avoid nephrotoxin - Holding lisinopril  till further evaluation with nephrology as an outpatient  Hyperlipidemia LDL 122 -patient deferred treatment, statin at this point, like to follow-up with PCP rechecking panel in 6 -8 weeks   Procedures performed: CT/MRI of the brain Disposition: Home Diet recommendation:  Discharge Diet Orders (From admission, onward)     Start     Ordered   10/22/23 0000  Diet - low sodium heart healthy        10/22/23 1037           Cardiac diet DISCHARGE MEDICATION: Allergies as of 10/22/2023       Reactions   Bee Venom Anaphylaxis   Flagyl  [metronidazole ] Anaphylaxis   Iodine Anaphylaxis   Shellfish Allergy Anaphylaxis   Tramadol  Hcl Other (See Comments)   Showed signs of stroke   Wasp Venom Anaphylaxis   Lactose Intolerance (gi) Hives   Bloating   Gluten Meal Other (See Comments)   Bloating    Melatonin Other (See Comments)   Nightmares         Medication List     PAUSE taking these medications    lisinopril  5 MG tablet Wait to take this until: Oct 27, 2023 Commonly known as: ZESTRIL  Take 0.5 tablets (2.5 mg total) by mouth daily.       STOP taking these medications    DELSYM PO   levocetirizine 5 MG tablet Commonly known as: XYZAL    methocarbamol  750 MG tablet Commonly known as: Robaxin -750  TAKE these medications    Adult Gummy Chew Chew 1 capsule by mouth daily.   aspirin  EC 81 MG tablet Take 1 tablet (81 mg total) by mouth daily. Swallow whole.   EPINEPHrine  0.3 mg/0.3 mL Soaj injection Commonly known as: EPI-PEN Inject 0.3 mg into the muscle as needed for anaphylaxis.   famotidine  40 MG tablet Commonly known as: PEPCID  Take 1 tablet (40 mg total) by mouth 2 (two) times daily as needed for heartburn/reflux.   lisdexamfetamine 40 MG capsule Commonly known as: Vyvanse  Take 1 capsule (40 mg total) by mouth every morning.    montelukast  10 MG tablet Commonly known as: SINGULAIR  Take 1 tablet (10 mg total) by mouth at bedtime.   promethazine -dextromethorphan 6.25-15 MG/5ML syrup Commonly known as: PROMETHAZINE -DM Take 5 mLs by mouth 4 (four) times daily as needed. What changed: reasons to take this   UNABLE TO FIND Asparagus drops-daily in water; Liver Life-10 drops daily   venlafaxine  XR 37.5 MG 24 hr capsule Commonly known as: Effexor  XR Take 1 capsule (37.5 mg total) by mouth daily with breakfast.   VITAMIN D-VITAMIN K  PO Take 1 capsule by mouth daily.        Discharge Exam: Filed Weights   10/21/23 1453  Weight: 70.8 kg        General:  AAO x 3,  cooperative, no distress;   HEENT:  Normocephalic, PERRL, otherwise with in Normal limits   Neuro:  CNII-XII intact. , normal motor and sensation, reflexes intact   Lungs:   Clear to auscultation BL, Respirations unlabored,  No wheezes / crackles  Cardio:    S1/S2, RRR, No murmure, No Rubs or Gallops   Abdomen:  Soft, non-tender, bowel sounds active all four quadrants, no guarding or peritoneal signs.  Muscular  skeletal:  Limited exam -global generalized weaknesses - in bed, able to move all 4 extremities,   2+ pulses,  symmetric, No pitting edema  Skin:  Dry, warm to touch, negative for any Rashes,  Wounds: Please see nursing documentation          Condition at discharge: good  The results of significant diagnostics from this hospitalization (including imaging, microbiology, ancillary and laboratory) are listed below for reference.   Imaging Studies: ECHOCARDIOGRAM COMPLETE BUBBLE STUDY Result Date: 10/22/2023    ECHOCARDIOGRAM REPORT   Patient Name:   Candace Allen Date of Exam: 10/22/2023 Medical Rec #:  [id number]     Height:       70.0 in Accession #:    [id number]    Weight:       156.1 lb Date of Birth:  [birth year]     BSA:          1.879 m Patient Age:    58 years      BP:           137/92 mmHg Patient Gender: F             HR:            77 bpm. Exam Location:  [facility] Procedure: 2D Echo, Cardiac Doppler, Color Doppler and Saline Contrast Bubble            Study (Both Spectral and Color Flow Doppler were utilized during            procedure). Indications:    Stroke  History:        Patient has prior history of Echocardiogram examinations. TIA.  Sonographer:    Willey Harrier  Referring Phys: [id number] DAVID J MERRELL IMPRESSIONS  1. Left ventricular ejection fraction, by estimation, is 60 to 65%. The left ventricle has normal function. The left ventricle has no regional wall motion abnormalities. Left ventricular diastolic parameters were normal.  2. Right ventricular systolic function is normal. The right ventricular size is normal. There is normal pulmonary artery systolic pressure. The estimated right ventricular systolic pressure is 31.7 mmHg.  3. The mitral valve is grossly normal. Mild mitral valve regurgitation.  4. Tricuspid valve regurgitation is mild to moderate.  5. The aortic valve is tricuspid. Aortic valve regurgitation is not visualized.  6. The inferior vena cava is normal in size with greater than 50% respiratory variability, suggesting right atrial pressure of 3 mmHg.  7. Agitated saline contrast bubble study was negative, with no evidence of any interatrial shunt. Comparison(s): Prior images reviewed side by side. LVEF 60-65%. Normal estimated RVSP. Negative agitated saline study. FINDINGS  Left Ventricle: Left ventricular ejection fraction, by estimation, is 60 to 65%. The left ventricle has normal function. The left ventricle has no regional wall motion abnormalities. The left ventricular internal cavity size was normal in size. There is  no left ventricular hypertrophy. Left ventricular diastolic parameters were normal. Right Ventricle: The right ventricular size is normal. No increase in right ventricular wall thickness. Right ventricular systolic function is normal. There is normal pulmonary artery systolic pressure.  The tricuspid regurgitant velocity is 2.68 m/s, and  with an assumed right atrial pressure of 3 mmHg, the estimated right ventricular systolic pressure is 31.7 mmHg. Left Atrium: Left atrial size was normal in size. Right Atrium: Right atrial size was normal in size. Pericardium: Trivial pericardial effusion is present. The pericardial effusion is posterior to the left ventricle. Presence of epicardial fat layer. Mitral Valve: The mitral valve is grossly normal. Mild mitral valve regurgitation. MV peak gradient, 4.4 mmHg. The mean mitral valve gradient is 2.0 mmHg. Tricuspid Valve: The tricuspid valve is grossly normal. Tricuspid valve regurgitation is mild to moderate. Aortic Valve: The aortic valve is tricuspid. There is mild aortic valve annular calcification. Aortic valve regurgitation is not visualized. Aortic valve peak gradient measures 7.6 mmHg. Pulmonic Valve: The pulmonic valve was grossly normal. Pulmonic valve regurgitation is trivial. Aorta: The aortic root and ascending aorta are structurally normal, with no evidence of dilitation. Venous: The inferior vena cava is normal in size with greater than 50% respiratory variability, suggesting right atrial pressure of 3 mmHg. IAS/Shunts: No atrial level shunt detected by color flow Doppler. Agitated saline contrast was given intravenously to evaluate for intracardiac shunting. Agitated saline contrast bubble study was negative, with no evidence of any interatrial shunt. Additional Comments: 3D was performed not requiring image post processing on an independent workstation and was indeterminate.  LEFT VENTRICLE PLAX 2D LVIDd:         4.00 cm   Diastology LVIDs:         2.90 cm   LV e' medial:    12.90 cm/s LV PW:         0.80 cm   LV E/e' medial:  7.7 LV IVS:        0.80 cm   LV e' lateral:   13.20 cm/s LVOT diam:     2.00 cm   LV E/e' lateral: 7.5 LV SV:         66 LV SV Index:   35 LVOT Area:     3.14 cm  RIGHT VENTRICLE  IVC RV Basal diam:  3.20  cm     IVC diam: 1.70 cm RV S prime:     14.90 cm/s TAPSE (M-mode): 2.3 cm LEFT ATRIUM           Index        RIGHT ATRIUM           Index LA diam:      3.20 cm 1.70 cm/m   RA Area:     10.70 cm LA Vol (A2C): 37.3 ml 19.86 ml/m  RA Volume:   23.90 ml  12.72 ml/m LA Vol (A4C): 33.4 ml 17.78 ml/m  AORTIC VALVE AV Area (Vmax): 2.27 cm AV Vmax:        138.00 cm/s AV Peak Grad:   7.6 mmHg LVOT Vmax:      99.90 cm/s LVOT Vmean:     73.400 cm/s LVOT VTI:       0.211 m  AORTA Ao Root diam: 2.80 cm Ao Asc diam:  3.00 cm MITRAL VALVE               TRICUSPID VALVE MV Area (PHT): 3.58 cm    TR Peak grad:   28.7 mmHg MV Area VTI:   2.73 cm    TR Vmax:        268.00 cm/s MV Peak grad:  4.4 mmHg MV Mean grad:  2.0 mmHg    SHUNTS MV Vmax:       1.05 m/s    Systemic VTI:  0.21 m MV Vmean:      72.7 cm/s   Systemic Diam: 2.00 cm MV Decel Time: 212 msec MV E velocity: 99.00 cm/s MV A velocity: 83.10 cm/s MV E/A ratio:  1.19 Teddie Favre MD Electronically signed by Teddie Favre MD Signature Date/Time: 10/22/2023/3:27:32 PM    Final    MR ANGIO HEAD WO CONTRAST Result Date: 10/22/2023 CLINICAL DATA:  Cerebral vaso spasm, weakness. EXAM: MRA NECK WITHOUT CONTRAST MRA HEAD WITHOUT CONTRAST TECHNIQUE: Angiographic images of the Circle of Willis were acquired using MRA technique without intravenous contrast. COMPARISON:  MRI head and CT head 10/21/2023. MRA head 10/20/2020, 02/20/2018. FINDINGS: MRA NECK FINDINGS Aortic arch: Visualized aortic arch is patent. Three vessel configuration of the arch. Right carotid system: Patent from the origin to the skull base. The carotid bifurcation appears widely patent. No evidence of significant atherosclerotic plaque or high-grade stenosis. Left carotid system: Patent from the origin to the skull base. The carotid bifurcation appears widely patent. No evidence of significant atherosclerotic plaque or high-grade stenosis. Vertebral arteries: Patent from the origin to the vertebrobasilar  confluence. Slightly limited evaluation of the distal V1 segments due to artifact. No evidence of dissection. Other: None. MRA HEAD FINDINGS Anterior circulation: The intracranial internal carotid arteries are patent and normal in caliber. The MCAs are patent bilaterally. The ACAs are patent bilaterally. Posterior circulation: The intracranial vertebral arteries are patent. The basilar artery is patent. PCAs are patent bilaterally. Fetal origin of the right PCA. The right posterior communicating arteries visualized. The superior cerebellar arteries are patent bilaterally. AICA patent bilaterally. PICA patent bilaterally. IMPRESSION: Negative MRA of the head and neck. No large vessel occlusion, aneurysm, or high-grade stenosis. Electronically Signed   By: Denny Flack M.D.   On: 10/22/2023 11:27   MR ANGIO NECK WO CONTRAST Result Date: 10/22/2023 CLINICAL DATA:  Cerebral vaso spasm, weakness. EXAM: MRA NECK WITHOUT CONTRAST MRA HEAD WITHOUT CONTRAST TECHNIQUE: Angiographic images of the Circle of Willis were acquired using  MRA technique without intravenous contrast. COMPARISON:  MRI head and CT head 10/21/2023. MRA head 10/20/2020, 02/20/2018. FINDINGS: MRA NECK FINDINGS Aortic arch: Visualized aortic arch is patent. Three vessel configuration of the arch. Right carotid system: Patent from the origin to the skull base. The carotid bifurcation appears widely patent. No evidence of significant atherosclerotic plaque or high-grade stenosis. Left carotid system: Patent from the origin to the skull base. The carotid bifurcation appears widely patent. No evidence of significant atherosclerotic plaque or high-grade stenosis. Vertebral arteries: Patent from the origin to the vertebrobasilar confluence. Slightly limited evaluation of the distal V1 segments due to artifact. No evidence of dissection. Other: None. MRA HEAD FINDINGS Anterior circulation: The intracranial internal carotid arteries are patent and normal in  caliber. The MCAs are patent bilaterally. The ACAs are patent bilaterally. Posterior circulation: The intracranial vertebral arteries are patent. The basilar artery is patent. PCAs are patent bilaterally. Fetal origin of the right PCA. The right posterior communicating arteries visualized. The superior cerebellar arteries are patent bilaterally. AICA patent bilaterally. PICA patent bilaterally. IMPRESSION: Negative MRA of the head and neck. No large vessel occlusion, aneurysm, or high-grade stenosis. Electronically Signed   By: Denny Flack M.D.   On: 10/22/2023 11:27   MR BRAIN WO CONTRAST Result Date: 10/21/2023 CLINICAL DATA:  Neuro deficit, acute, stroke suspected. EXAM: MRI HEAD WITHOUT CONTRAST TECHNIQUE: Multiplanar, multiecho pulse sequences of the brain and surrounding structures were obtained without intravenous contrast. COMPARISON:  Head CT immediately prior FINDINGS: Brain: The brain has a normal appearance without evidence of malformation, atrophy, old or acute small or large vessel infarction, mass lesion, hemorrhage, hydrocephalus or extra-axial collection. Vascular: Major vessels at the base of the brain show flow. Venous sinuses appear patent. Skull and upper cervical spine: Normal. Sinuses/Orbits: Clear/normal. Other: None significant. IMPRESSION: Normal examination. No abnormality seen to explain the clinical presentation. Electronically Signed   By: Bettylou Brunner M.D.   On: 10/21/2023 16:58   CT HEAD CODE STROKE WO CONTRAST Result Date: 10/21/2023 CLINICAL DATA:  Code stroke. Neuro deficit, acute, stroke suspected. Dizziness. Numbness. EXAM: CT HEAD WITHOUT CONTRAST TECHNIQUE: Contiguous axial images were obtained from the base of the skull through the vertex without intravenous contrast. RADIATION DOSE REDUCTION: This exam was performed according to the departmental dose-optimization program which includes automated exposure control, adjustment of the mA and/or kV according to patient size  and/or use of iterative reconstruction technique. COMPARISON:  MRI 10/20/2020 FINDINGS: Brain: The brain shows a normal appearance without evidence of malformation, atrophy, old or acute small or large vessel infarction, mass lesion, hemorrhage, hydrocephalus or extra-axial collection. Vascular: No hyperdense vessel. No evidence of atherosclerotic calcification. Skull: Normal.  No traumatic finding.  No focal bone lesion. Sinuses/Orbits: Sinuses are clear. Orbits appear normal. Mastoids are clear. Other: None significant ASPECTS (Alberta Stroke Program Early CT Score) - Ganglionic level infarction (caudate, lentiform nuclei, internal capsule, insula, M1-M3 cortex): 7 - Supraganglionic infarction (M4-M6 cortex): 3 Total score (0-10 with 10 being normal): 10 IMPRESSION: 1. Normal head CT. 2. Aspects is 10. 3. These results were called by telephone at the time of interpretation on 10/21/2023 at 3:38 pm to provider Iva Mariner , who verbally acknowledged these results. Electronically Signed   By: Bettylou Brunner M.D.   On: 10/21/2023 15:39    Microbiology: Results for orders placed or performed in visit on 12/31/22  Urine Culture     Status: None   Collection Time: 12/31/22  8:53 AM   Specimen: Urine  Result Value Ref Range Status   MICRO NUMBER: [id number]  Final   SPECIMEN QUALITY: Adequate  Final   Sample Source NOT GIVEN  Final   STATUS: FINAL  Final   Result:   Final    Less than 10,000 CFU/mL of single Gram negative organism isolated. No further testing will be performed. If clinically indicated, recollection using a method to minimize contamination, with prompt transfer to Urine Culture Transport Tube, is recommended.    Labs: CBC: Recent Labs  Lab 10/21/23 1530  WBC 6.0  NEUTROABS 2.9  HGB 12.6  HCT 38.6  MCV 96.0  PLT 261   Basic Metabolic Panel: Recent Labs  Lab 10/21/23 1530 10/22/23 0357  NA 136 138  K 3.5 3.9  CL 101 103  CO2 28 26  GLUCOSE 88 93  BUN 14 20  CREATININE 1.78*  1.24*  CALCIUM 9.4 9.0   Liver Function Tests: Recent Labs  Lab 10/21/23 1530  AST 65*  ALT 43  ALKPHOS 83  BILITOT 0.6  PROT 7.0  ALBUMIN 4.3   CBG: Recent Labs  Lab 10/21/23 1452 10/21/23 1516 10/21/23 1528  GLUCAP 84 80 85    Discharge time spent: greater than 30 minutes.  Signed: Bobbetta Burnet, MD [facility] 10/22/2023

## 2023-10-22 NOTE — Hospital Course (Signed)
 Candace Allen is a 58 y.o. female with medical history significant of Endometrial ca, TIA, hypothyroidism, IBS, GERD.    Pt presenting after acute onset Dizziness. Described as both feleing lightheaded and w/ intermittent room spinning. Some time after initial dizziness pt also developed change in sensation to L side of face, L arm and L foot. Arm and foot have resolved since initial eval in ED. Dizziness has also improved. Reports being in her nml state of health prior to ED presentaiton.

## 2023-10-22 NOTE — Plan of Care (Signed)
  Problem: Education: Goal: Knowledge of disease or condition will improve Outcome: Progressing Goal: Knowledge of patient specific risk factors will improve (DELETE if not current risk factor) Outcome: Progressing   Problem: Ischemic Stroke/TIA Tissue Perfusion: Goal: Complications of ischemic stroke/TIA will be minimized Outcome: Progressing   Problem: Coping: Goal: Will identify appropriate support needs Outcome: Progressing   Problem: Self-Care: Goal: Ability to participate in self-care as condition permits will improve Outcome: Progressing   Problem: Nutrition: Goal: Risk of aspiration will decrease Outcome: Progressing Goal: Dietary intake will improve Outcome: Progressing

## 2023-10-22 NOTE — TOC Transition Note (Signed)
 Transition of Care [facility]) - Discharge Note   Patient Details  Name: Candace Allen MRN: [id number] Date of Birth: 1965-08-03  [facility] of [facility]) CM/SW Contact:  Ander Katos, LCSW Phone Number: 10/22/2023, 10:21 AM   Clinical Narrative: OT recommending outpatient rehab. LCSW spoke with pt and she is agreeable, requesting [facility]. Referral sent. No other needs reported.       Final next level of care: OP Rehab Barriers to Discharge: Barriers Resolved   Patient Goals and CMS Choice Patient states their goals for this hospitalization and ongoing recovery are:: return home   Choice offered to / list presented to : Patient [place] ownership interest in [facility].provided to::  (n/a)    Discharge Placement                       Discharge Plan and Services Additional resources added to the After Visit Summary for                                       Social Drivers of Health (SDOH) Interventions SDOH Screenings   Food Insecurity: No Food Insecurity (10/21/2023)  Housing: Low Risk  (10/21/2023)  Transportation Needs: No Transportation Needs (10/21/2023)  Utilities: Not At Risk (10/21/2023)  Alcohol Screen: Low Risk  (09/02/2023)  Depression (PHQ2-9): Low Risk  (09/19/2023)  Financial Resource Strain: Low Risk  (09/02/2023)  Physical Activity: Sufficiently Active (09/02/2023)  Social Connections: Moderately Integrated (09/02/2023)  Stress: Stress Concern Present (09/02/2023)  Tobacco Use: Low Risk  (10/21/2023)     Readmission Risk Interventions     No data to display

## 2023-10-22 NOTE — Evaluation (Signed)
 Occupational Therapy Evaluation Patient Details Name: Candace Allen MRN: [id number] DOB: January 08, 1966 Today's Date: 10/22/2023   History of Present Illness   Candace Allen is a 58 y.o. female with medical history significant of Endometrial ca, TIA, hypothyroidism, IBS, GERD.      Pt presenting after acute onset Dizziness. Described as both feleing lightheaded and w/ intermittent room spinning. Some time after initial dizziness pt also developed change in sensation to L side of face, L arm and L foot. Arm and foot have resolved since initial eval in ED. Dizziness has also improved. Reports being in her nml state of health prior to ED presentaiton. (per MD)     Clinical Impressions Pt agreeable to OT and PT co-evaluation. Pt appears to be near baseline function for ADL's and ambulation, but does show very mild weakness in areas of L UE. Pt demonstrates [facility] coordination and sensation in L UE. Pt independently ambulating and completing bed mobility. Pt left in the bed with call bell within reach and family present. Pt is not recommended for further acute OT services and will be discharged to care of nursing staff for remaining length of stay.                Functional Status Assessment   Patient has not had a recent decline in their functional status     Equipment Recommendations   None recommended by OT             Precautions/Restrictions   Precautions Precautions: None Restrictions Weight Bearing Restrictions Per Provider Order: No     Mobility Bed Mobility Overal bed mobility: Independent                  Transfers Overall transfer level: Independent                        Balance Overall balance assessment: Independent                                         ADL either performed or assessed with clinical judgement   ADL Overall ADL's : Independent                                             Vision  Baseline Vision/History: 1 Wears glasses Ability to See in Adequate Light: 1 Impaired Patient Visual Report: No change from baseline Vision Assessment?: Wears glasses for reading;No apparent visual deficits     Perception Perception: Not tested       Praxis Praxis: Not tested       Pertinent Vitals/Pain Pain Assessment Pain Assessment: Faces Faces Pain Scale: Hurts a little bit Pain Location: back Pain Descriptors / Indicators: Discomfort Pain Intervention(s): Monitored during session     Extremity/Trunk Assessment Upper Extremity Assessment Upper Extremity Assessment: Left hand dominant;LUE deficits/detail LUE Deficits / Details: Mild L UE weakness compared to R. 4+/5 elbow flexion, elbow extension, wrist flexion, and wrist extension. 5/5 otherwise. LUE Sensation: WNL LUE Coordination: WNL   Lower Extremity Assessment Lower Extremity Assessment: Defer to PT evaluation   Cervical / Trunk Assessment Cervical / Trunk Assessment: Normal   Communication Communication Communication: No apparent difficulties   Cognition Arousal: Alert Behavior During Therapy: WFL for tasks assessed/performed Cognition:  No apparent impairments                               Following commands: Intact       Cueing  General Comments   Cueing Techniques: Verbal cues                 Home Living Family/patient expects to be discharged to:: Private residence Living Arrangements: Spouse/significant other Available Help at Discharge: Family Type of Home: House Home Access: Level entry     Home Layout: Two level Alternate Level Stairs-Number of Steps: 18 Alternate Level Stairs-Rails: Can reach both;Right;Left Bathroom Shower/Tub: Chief Strategy Officer: Standard Bathroom Accessibility: Yes How Accessible: Accessible via walker Home Equipment: None          Prior Functioning/Environment Prior Level of Function : Independent/Modified Independent                                             Co-evaluation PT/OT/SLP Co-Evaluation/Treatment: Yes Reason for Co-Treatment: To address functional/ADL transfers   OT goals addressed during session: ADL's and self-care      AM-PAC OT "6 Clicks" Daily Activity     Outcome Measure Help from another person eating meals?: None Help from another person taking care of personal grooming?: None Help from another person toileting, which includes using toliet, bedpan, or urinal?: None Help from another person bathing (including washing, rinsing, drying)?: None Help from another person to put on and taking off regular upper body clothing?: None Help from another person to put on and taking off regular lower body clothing?: None 6 Click Score: 24   End of Session    Activity Tolerance: Patient tolerated treatment well Patient left: in bed;with call bell/phone within reach;with family/visitor present  OT Visit Diagnosis: Other symptoms and signs involving the nervous system (R29.898);Muscle weakness (generalized) (M62.81)                Time: 9562-1308 OT Time Calculation (min): 10 min Charges:  OT General Charges $OT Visit: 1 Visit OT Evaluation $OT Eval Low Complexity: 1 Low  Lylliana Kitamura OT, MOT  Thurnell Floss 10/22/2023, 9:42 AM

## 2023-10-23 ENCOUNTER — Ambulatory Visit: Admitting: *Deleted

## 2023-10-23 DIAGNOSIS — M25562 Pain in left knee: Secondary | ICD-10-CM | POA: Diagnosis not present

## 2023-10-23 DIAGNOSIS — G8929 Other chronic pain: Secondary | ICD-10-CM

## 2023-10-23 NOTE — Therapy (Signed)
 OUTPATIENT PHYSICAL THERAPY LOWER EXTREMITY EVALUATION   Patient Name: Candace Allen MRN: [id number] DOB:Aug 24, 1965, 58 y.o., female Today's Date: 10/23/2023  END OF SESSION:  PT End of Session - 10/23/23 7829     Visit Number 6    Number of Visits 12    Date for PT Re-Evaluation 11/19/23    PT Start Time 0800    PT Stop Time 0900    PT Time Calculation (min) 60 min             Past Medical History:  Diagnosis Date   Attention deficit disorder (ADD) in adult    Chronic renal insufficiency, stage 3 (moderate) (HCC)    ?d/t dexilant   Diverticulosis 02/19/2018   By colonoscopy 2016   Endometrial cancer (HCC)    GERD (gastroesophageal reflux disease)    Hx of concussion    Approximately 2016   Hypothyroidism    question of-->no hx of thyroid  lab abnormalities in record   Hypothyroidism (acquired) 08/20/2019   IBS (irritable bowel syndrome)    Past Surgical History:  Procedure Laterality Date   CHOLECYSTECTOMY N/A 11/08/2021   Procedure: LAPAROSCOPIC CHOLECYSTECTOMY;  Surgeon: Marijo Shove, DO;  Location: [facility];  Service: General;  Laterality: N/A;   COLONOSCOPY  2016   2016 no polyps. Rpt at [facility] 2022 per pt, normal, no record available   ENDOMETRIAL ABLATION     SHOULDER SURGERY Left    Approximately 2016   TRANSTHORACIC ECHOCARDIOGRAM     01/2021 NORMAL   TUBAL LIGATION     UPPER GASTROINTESTINAL ENDOSCOPY  11/10/2019   neg   Patient Active Problem List   Diagnosis Date Noted   Stroke [facility]) 10/21/2023   AKI (acute kidney injury) (HCC) 10/21/2023   Encounter for screening fecal occult blood testing 09/02/2023   Routine general medical examination at a health care facility 09/02/2023   Environmental and seasonal allergies 09/02/2023   Stage 3a chronic kidney disease (HCC) 07/22/2023   Attention deficit disorder (ADD) in adult 07/17/2023   Decreased GFR 07/17/2023   Pure hypercholesterolemia 07/17/2023   Calculus of gallbladder without  cholecystitis without obstruction    Other fatigue 09/13/2021   TIA (transient ischemic attack) 10/20/2020   Postmenopausal 11/09/2019   Weight loss 11/09/2019   History of endometrial cancer 02/19/2018   GERD (gastroesophageal reflux disease) 02/19/2018   IBS (irritable bowel syndrome) 02/19/2018   Diverticulosis 02/19/2018    REFERRING PROVIDER:  Chrystine Crate  REFERRING DIAG: Chronic pain of left knee.  THERAPY DIAG:  Chronic pain of left knee  Rationale for Evaluation and Treatment: Rehabilitation  ONSET DATE: 8 years.    SUBJECTIVE:   SUBJECTIVE STATEMENT: 2-3/10 LT knee pain. Did okay after last Rx. Feel better today. Had to go to ED for TIA symptoms LT side, but doing Okay today     PERTINENT HISTORY: Please see above.  Left Sciatica.  PAIN:  Are you having pain? Yes: NPRS scale: 2-3/10. Pain location: Left knee. Pain description: Ache, sore, throbbing, sharp. Aggravating factors: "Everything." Relieving factors: "Nothing."  PRECAUTIONS: Other: No pain increase therex.  WEIGHT BEARING RESTRICTIONS: No  FALLS:  Has patient fallen in last 6 months? No  LIVING ENVIRONMENT: Lives with: lives with their spouse Lives in: House/apartment Has following equipment at home: None  OCCUPATION: Self-employed.    PLOF: Independent  PATIENT GOALS: To do things she wants to do without knee pain.     OBJECTIVE:  Note: Objective measures were completed at Evaluation  unless otherwise noted.  DIAGNOSTIC FINDINGS: 09/19/23:  X-ray:  FINDINGS: Frontal view both knees with lateral view of the left knee provided. The alignment and joint spaces are preserved. There is mild peripheral spurring of the medial tibiofemoral and patellofemoral compartments of the left knee. No fracture. No erosions or focal bone abnormality. No joint effusion. Unremarkable soft tissues. Frontal view of the right knee is unremarkable.   IMPRESSION: Mild osteoarthritis of the left  knee.  EDEMA:  Circumferential: Equal. No edema noted today.  PALPATION: No notable palpable pain today.   LOWER EXTREMITY ROM:  Full active left knee range of motion.  The patient's knees are remarkable for genu recurvatum.    LOWER EXTREMITY MMT:  Normal right knee and hip strength.  LOWER EXTREMITY SPECIAL TESTS:  The patient's knee is stable. (-) Anterior Drawer, varus/valgus testing.    GAIT: Normal gait pattern.                                                                                                                                TREATMENT DATE:   10/23/23                                 EXERCISE LOG   LT knee  Exercise Repetitions and Resistance Comments  Recumbent bike Level 2 x 15 minutes   Knee ext 20  # x 3 minutes   Ham curls 40# x 3 minutes   Leg Press    Rocker board X  5 mins   LE elevation and vasopneumatic x 15 minutes with IFC  x 15 mins at100% scan to patient's left knee.     Bike L 4 x 15 mins  SAQ's  3# x10 hold 5 secs,   2 x 10 hold 10 secs Reviewed HEP LE elevation and vasopneumatic x 15 minutes with IFC 100% scan to patient's left knee.   10/14/23:                                     EXERCISE LOG  Exercise Repetitions and Resistance Comments  Recumbent bike Level 3 x 15 minutes   Knee ext 10# x 3 minutes   Ham curls 40# x 3 minutes   Leg Press 2 plates x 3 minutes       LE elevation and vasopneumatic x 15 minutes to patient's left knee.  10-09-23                                    EXERCISE LOG  LT knee  Exercise Repetitions and Resistance Comments  Bike X10 mins   Heel ups/Toe ups X fatigue HEP 3 x fatigue  QS and SLR 3x10 HEP  SL Hip  ABD 3x10 HEP  SAQ 3# 2x10 hold 10 secs   Clam shell     Blank cell = exercise not performed today    PATIENT EDUCATION:  Education details: Discussed avoiding activities that reproduce patient's knee pain. Person educated: Patient Education method: Explanation Education comprehension:  verbalized understanding  HOME EXERCISE PROGRAM:   ASSESSMENT:  CLINICAL IMPRESSION: Patient arrived today doing okay, but reports having TIA 2 days ago and having to go to ED. She was able to continue with LT knee rehab and did well. LTG's partially met at this time.    OBJECTIVE IMPAIRMENTS: decreased activity tolerance and pain.   ACTIVITY LIMITATIONS: carrying, lifting, and bending  PARTICIPATION LIMITATIONS: meal prep, cleaning, laundry, and yard work  PERSONAL FACTORS: Time since onset of injury/illness/exacerbation are also affecting patient's functional outcome.   REHAB POTENTIAL: Good  CLINICAL DECISION MAKING: Evolving/moderate complexity  EVALUATION COMPLEXITY: Low   GOALS:  LONG TERM GOALS: Target date: 11/19/23  Ind with a HEP.  Goal status: MET  2.  Perform ADL's with left knee pain not > 2-3/10.  Goal status: Partially met    Intermittent  3.  Walk a community distance with pain not > 2-3/10.  Goal status: Partially met    intermittent   PLAN:  PT FREQUENCY: 2x/week  PT DURATION: 6 weeks  PLANNED INTERVENTIONS: 97110-Therapeutic exercises, 97530- Therapeutic activity, [id number]- Neuromuscular re-education, 97535- Self Care, 16109- Manual therapy, G0283- Electrical stimulation (unattended), 97016- Vasopneumatic device, Patient/Family education, Cryotherapy, and Moist heat  PLAN FOR NEXT SESSION: Pain-free left LE therex.  Modalities as needed.     Akari Defelice,CHRIS, PTA 10/23/2023, 10:27 AM

## 2023-10-24 ENCOUNTER — Encounter: Payer: Self-pay | Admitting: *Deleted

## 2023-10-26 ENCOUNTER — Other Ambulatory Visit: Payer: Self-pay | Admitting: Family Medicine

## 2023-10-26 DIAGNOSIS — F988 Other specified behavioral and emotional disorders with onset usually occurring in childhood and adolescence: Secondary | ICD-10-CM

## 2023-10-27 ENCOUNTER — Other Ambulatory Visit: Payer: Self-pay | Admitting: Family Medicine

## 2023-10-27 ENCOUNTER — Telehealth: Payer: Self-pay | Admitting: Family Medicine

## 2023-10-27 DIAGNOSIS — F988 Other specified behavioral and emotional disorders with onset usually occurring in childhood and adolescence: Secondary | ICD-10-CM

## 2023-10-27 NOTE — Telephone Encounter (Signed)
 Copied from CRM [id number]. Topic: Clinical - Medication Refill >> Oct 27, 2023  3:02 PM Lizabeth Riggs wrote: Medication: lisdexamfetamine (VYVANSE ) 40 MG capsule  Has the patient contacted their pharmacy? Yes (Agent: If no, request that the patient contact the pharmacy for the refill. If patient does not wish to contact the pharmacy document the reason why and proceed with request.) (Agent: If yes, when and what did the pharmacy advise?) Pharmacy needs order to refill  This is the patient's preferred pharmacy:   [facility] [phone] - [place], [place] - [address] AT [facility] [address] [place] [address] Phone: [phone] Fax: [phone]  Is this the correct pharmacy for this prescription? Yes If no, delete pharmacy and type the correct one.   Has the prescription been filled recently? No  Is the patient out of the medication? Yes  Has the patient been seen for an appointment in the last year OR does the patient have an upcoming appointment? Yes  Can we respond through MyChart? Yes  Agent: Please be advised that Rx refills may take up to 3 business days. We ask that you follow-up with your pharmacy.

## 2023-10-27 NOTE — Telephone Encounter (Unsigned)
 Copied from CRM [id number]. Topic: General - Other >> Oct 27, 2023  9:09 AM Candace Allen wrote: Reason for CRM: Patient called in wanting to let Eben Golds know she was in the hospital due to TIA

## 2023-10-28 ENCOUNTER — Encounter: Payer: Self-pay | Admitting: *Deleted

## 2023-10-28 ENCOUNTER — Encounter: Payer: Self-pay | Admitting: Family

## 2023-10-28 ENCOUNTER — Ambulatory Visit: Admitting: *Deleted

## 2023-10-28 ENCOUNTER — Telehealth: Payer: Self-pay | Admitting: Family Medicine

## 2023-10-28 ENCOUNTER — Ambulatory Visit: Payer: Self-pay

## 2023-10-28 ENCOUNTER — Telehealth (INDEPENDENT_AMBULATORY_CARE_PROVIDER_SITE_OTHER): Admitting: Family

## 2023-10-28 DIAGNOSIS — F988 Other specified behavioral and emotional disorders with onset usually occurring in childhood and adolescence: Secondary | ICD-10-CM

## 2023-10-28 DIAGNOSIS — G8929 Other chronic pain: Secondary | ICD-10-CM

## 2023-10-28 DIAGNOSIS — M25562 Pain in left knee: Secondary | ICD-10-CM | POA: Diagnosis not present

## 2023-10-28 LAB — POCT I-STAT, CHEM 8
BUN: 15 mg/dL (ref 6–20)
Calcium, Ion: 1.08 mmol/L — ABNORMAL LOW (ref 1.15–1.40)
Chloride: 102 mmol/L (ref 98–111)
Creatinine, Ser: 1.9 mg/dL — ABNORMAL HIGH (ref 0.44–1.00)
Glucose, Bld: 85 mg/dL (ref 70–99)
HCT: 38 % (ref 36.0–46.0)
Hemoglobin: 12.9 g/dL (ref 12.0–15.0)
Potassium: 3.6 mmol/L (ref 3.5–5.1)
Sodium: 137 mmol/L (ref 135–145)
TCO2: 27 mmol/L (ref 22–32)

## 2023-10-28 MED ORDER — LISDEXAMFETAMINE DIMESYLATE 40 MG PO CAPS: 40.0000 mg | ORAL_CAPSULE | ORAL | 0 refills | Status: DC

## 2023-10-28 NOTE — Telephone Encounter (Signed)
 Duplicate request -- see Rx request from 10/27/2023

## 2023-10-28 NOTE — Telephone Encounter (Signed)
 Appointment scheduled today with [facility]. Hospital follow up scheduled tomorrow with DOD

## 2023-10-28 NOTE — Telephone Encounter (Signed)
 Copied from CRM [id number]. Topic: Clinical - Medication Refill >> Oct 27, 2023  3:02 PM Lizabeth Riggs wrote: Medication: lisdexamfetamine (VYVANSE ) 40 MG capsule  Has the patient contacted their pharmacy? Yes (Agent: If no, request that the patient contact the pharmacy for the refill. If patient does not wish to contact the pharmacy document the reason why and proceed with request.) (Agent: If yes, when and what did the pharmacy advise?) Pharmacy needs order to refill  This is the patient's preferred pharmacy:   [phone] - [place], [place] - [address] AT [facility] [address] [place] [address] [place] [address] Phone: [phone] Fax: [phone]  Is this the correct pharmacy for this prescription? Yes If no, delete pharmacy and type the correct one.   Has the prescription been filled recently? No  Is the patient out of the medication? Yes  Has the patient been seen for an appointment in the last year OR does the patient have an upcoming appointment? Yes  Can we respond through MyChart? Yes  Agent: Please be advised that Rx refills may take up to 3 business days. We ask that you follow-up with your pharmacy. >> Oct 28, 2023  9:58 AM Tiffany H wrote: Patient called to follow up on refill request for Vyvanse . Patient had recent neurological event, see 10/21/23 encounter with ED. Patient originally requested refill on 10/26/23. Pending in Meds List.

## 2023-10-28 NOTE — Progress Notes (Signed)
 Virtual Visit Consent   Candace Allen, you are scheduled for a virtual visit with a [facility] provider today. Just as with appointments in the office, your consent must be obtained to participate. Your consent will be active for this visit and any virtual visit you may have with one of our providers in the next 365 days. If you have a MyChart account, a copy of this consent can be sent to you electronically.  As this is a virtual visit, video technology does not allow for your provider to perform a traditional examination. This may limit your provider's ability to fully assess your condition. If your provider identifies any concerns that need to be evaluated in person or the need to arrange testing (such as labs, EKG, etc.), we will make arrangements to do so. Although advances in technology are sophisticated, we cannot ensure that it will always work on either your end or our end. If the connection with a video visit is poor, the visit may have to be switched to a telephone visit. With either a video or telephone visit, we are not always able to ensure that we have a secure connection.  By engaging in this virtual visit, you consent to the provision of healthcare and authorize for your insurance to be billed (if applicable) for the services provided during this visit. Depending on your insurance coverage, you may receive a charge related to this service.  I need to obtain your verbal consent now. Are you willing to proceed with your visit today? Maricelda Mccausland has provided verbal consent on 10/28/2023 for a virtual visit (video or telephone). Tommas Fragmin, FNP  Date: 10/28/2023 2:01 PM   Virtual Visit via Video Note   I, Tommas Fragmin, connected with  Candace Allen  ([id number], 1966-04-18) on 10/28/23 at  1:55 PM EDT by a video-enabled telemedicine application and verified that I am speaking with the correct person using two identifiers.  Location: Patient: Virtual Visit Location Patient: Other:  car Provider: Virtual Visit Location Provider: Home Office   I discussed the limitations of evaluation and management by telemedicine and the availability of in person appointments. The patient expressed understanding and agreed to proceed.    History of Present Illness: Candace Allen is a 58 y.o. who identifies as a female who was assigned female at birth, and is being seen today for ADHD follow up. She is currently taking Vyvanse  40 mg. She had a referral placed to [facility] for testing for ADHD. The referral that was placed, did not do adult ADHD testing.   She reports she struggles staying on task and focusing without the medications. States she will start 15 tasks, but never complete any of these.   HPI: HPI  Problems:  Patient Active Problem List   Diagnosis Date Noted   Stroke (HCC) 10/21/2023   AKI (acute kidney injury) (HCC) 10/21/2023   Encounter for screening fecal occult blood testing 09/02/2023   Routine general medical examination at a health care facility 09/02/2023   Environmental and seasonal allergies 09/02/2023   Stage 3a chronic kidney disease (HCC) 07/22/2023   Attention deficit disorder (ADD) in adult 07/17/2023   Decreased GFR 07/17/2023   Pure hypercholesterolemia 07/17/2023   Calculus of gallbladder without cholecystitis without obstruction    Other fatigue 09/13/2021   TIA (transient ischemic attack) 10/20/2020   Postmenopausal 11/09/2019   Weight loss 11/09/2019   History of endometrial cancer 02/19/2018   GERD (gastroesophageal reflux disease) 02/19/2018  IBS (irritable bowel syndrome) 02/19/2018   Diverticulosis 02/19/2018    Allergies:  Allergies  Allergen Reactions   Bee Venom Anaphylaxis   Flagyl  [Metronidazole ] Anaphylaxis   Iodine Anaphylaxis   Shellfish Allergy Anaphylaxis   Tramadol  Hcl Other (See Comments)    Showed signs of stroke   Wasp Venom Anaphylaxis   Lactose Intolerance (Gi) Hives    Bloating   Gluten Meal Other (See  Comments)    Bloating    Melatonin Other (See Comments)    Nightmares    Medications:  Current Outpatient Medications:    aspirin  EC 81 MG tablet, Take 1 tablet (81 mg total) by mouth daily. Swallow whole., Disp: 30 tablet, Rfl: 12   EPINEPHrine  0.3 mg/0.3 mL IJ SOAJ injection, Inject 0.3 mg into the muscle as needed for anaphylaxis., Disp: 1 each, Rfl: 0   famotidine  (PEPCID ) 40 MG tablet, Take 1 tablet (40 mg total) by mouth 2 (two) times daily as needed for heartburn/reflux., Disp: 180 tablet, Rfl: 0   lisdexamfetamine (VYVANSE ) 40 MG capsule, Take 1 capsule (40 mg total) by mouth every morning., Disp: 30 capsule, Rfl: 0   lisinopril  (ZESTRIL ) 5 MG tablet, Take 0.5 tablets (2.5 mg total) by mouth daily., Disp: 90 tablet, Rfl: 0   montelukast  (SINGULAIR ) 10 MG tablet, Take 1 tablet (10 mg total) by mouth at bedtime., Disp: 30 tablet, Rfl: 3   Multiple Vitamins-Minerals (ADULT GUMMY) CHEW, Chew 1 capsule by mouth daily., Disp: , Rfl:    UNABLE TO FIND, Asparagus drops-daily in water; Liver Life-10 drops daily, Disp: , Rfl:    venlafaxine  XR (EFFEXOR  XR) 37.5 MG 24 hr capsule, Take 1 capsule (37.5 mg total) by mouth daily with breakfast., Disp: 90 capsule, Rfl: 0   VITAMIN D-VITAMIN K  PO, Take 1 capsule by mouth daily., Disp: , Rfl:   Observations/Objective: Patient is well-developed, well-nourished in no acute distress.  Resting comfortably  Head is normocephalic, atraumatic.  No labored breathing. Speech is clear and coherent with logical content.  Patient is alert and oriented at baseline.   Assessment and Plan: 1. Attention deficit disorder (ADD) in adult - lisdexamfetamine (VYVANSE ) 40 MG capsule; Take 1 capsule (40 mg total) by mouth every morning.  Dispense: 30 capsule; Refill: 0  Continue Vyvyanse 40 mg  Meds as prescribed Behavior modification as needed Follow-up for recheck in 3 months for contract   Follow Up Instructions: I discussed the assessment and treatment plan  with the patient. The patient was provided an opportunity to ask questions and all were answered. The patient agreed with the plan and demonstrated an understanding of the instructions.  A copy of instructions were sent to the patient via MyChart unless otherwise noted below.     The patient was advised to call back or seek an in-person evaluation if the symptoms worsen or if the condition fails to improve as anticipated.    Tommas Fragmin, FNP

## 2023-10-28 NOTE — Telephone Encounter (Signed)
 Spoke to patient and Kathaleen Pale.    Kathaleen Pale is going to look into sending refill for patient.    I have blocked a DOD apt tomorrow for a hospital follow up with patient.  When calling back see if patient can make appointment tomorrow for hospital follow up.

## 2023-10-28 NOTE — Telephone Encounter (Signed)
 Please see if patient can do a video visit today to get Vyvanse  refilled. She would have a hospital follow up visit tomorrow with DOD.

## 2023-10-28 NOTE — Therapy (Signed)
 OUTPATIENT PHYSICAL THERAPY LOWER EXTREMITY EVALUATION   Patient Name: Candace Allen MRN: [id number] DOB:Dec 07, 1965, 58 y.o., female Today's Date: 10/28/2023  END OF SESSION:  PT End of Session - 10/28/23 0813     Visit Number 7    Number of Visits 12    Date for PT Re-Evaluation 11/19/23    PT Start Time 0805    PT Stop Time 0911    PT Time Calculation (min) 66 min             Past Medical History:  Diagnosis Date   Attention deficit disorder (ADD) in adult    Chronic renal insufficiency, stage 3 (moderate) (HCC)    ?d/t dexilant   Diverticulosis 02/19/2018   By colonoscopy 2016   Endometrial cancer (HCC)    GERD (gastroesophageal reflux disease)    Hx of concussion    Approximately 2016   Hypothyroidism    question of-->no hx of thyroid  lab abnormalities in record   Hypothyroidism (acquired) 08/20/2019   IBS (irritable bowel syndrome)    Past Surgical History:  Procedure Laterality Date   CHOLECYSTECTOMY N/A 11/08/2021   Procedure: LAPAROSCOPIC CHOLECYSTECTOMY;  Surgeon: Marijo Shove, DO;  Location: [facility];  Service: General;  Laterality: N/A;   COLONOSCOPY  2016   2016 no polyps. Rpt at [facility] 2022 per pt, normal, no record available   ENDOMETRIAL ABLATION     SHOULDER SURGERY Left    Approximately 2016   TRANSTHORACIC ECHOCARDIOGRAM     01/2021 NORMAL   TUBAL LIGATION     UPPER GASTROINTESTINAL ENDOSCOPY  11/10/2019   neg   Patient Active Problem List   Diagnosis Date Noted   Stroke [facility]) 10/21/2023   AKI (acute kidney injury) (HCC) 10/21/2023   Encounter for screening fecal occult blood testing 09/02/2023   Routine general medical examination at a health care facility 09/02/2023   Environmental and seasonal allergies 09/02/2023   Stage 3a chronic kidney disease (HCC) 07/22/2023   Attention deficit disorder (ADD) in adult 07/17/2023   Decreased GFR 07/17/2023   Pure hypercholesterolemia 07/17/2023   Calculus of gallbladder without  cholecystitis without obstruction    Other fatigue 09/13/2021   TIA (transient ischemic attack) 10/20/2020   Postmenopausal 11/09/2019   Weight loss 11/09/2019   History of endometrial cancer 02/19/2018   GERD (gastroesophageal reflux disease) 02/19/2018   IBS (irritable bowel syndrome) 02/19/2018   Diverticulosis 02/19/2018    REFERRING PROVIDER:  Chrystine Crate  REFERRING DIAG: Chronic pain of left knee.  THERAPY DIAG:  Chronic pain of left knee  Rationale for Evaluation and Treatment: Rehabilitation  ONSET DATE: 8 years.    SUBJECTIVE:   SUBJECTIVE STATEMENT: 3/10 LT knee pain/soreness due to maybe over doing it at gym.   Did okay after last Rx. Sciatica acting up today     PERTINENT HISTORY: Please see above.  Left Sciatica.  PAIN:  Are you having pain? Yes: NPRS scale: 2-3/10. Pain location: Left knee. Pain description: Ache, sore, throbbing, sharp. Aggravating factors: "Everything." Relieving factors: "Nothing."  PRECAUTIONS: Other: No pain increase therex.  WEIGHT BEARING RESTRICTIONS: No  FALLS:  Has patient fallen in last 6 months? No  LIVING ENVIRONMENT: Lives with: lives with their spouse Lives in: House/apartment Has following equipment at home: None  OCCUPATION: Self-employed.    PLOF: Independent  PATIENT GOALS: To do things she wants to do without knee pain.     OBJECTIVE:  Note: Objective measures were completed at Evaluation unless otherwise noted.  DIAGNOSTIC FINDINGS: 09/19/23:  X-ray:  FINDINGS: Frontal view both knees with lateral view of the left knee provided. The alignment and joint spaces are preserved. There is mild peripheral spurring of the medial tibiofemoral and patellofemoral compartments of the left knee. No fracture. No erosions or focal bone abnormality. No joint effusion. Unremarkable soft tissues. Frontal view of the right knee is unremarkable.   IMPRESSION: Mild osteoarthritis of the left knee.  EDEMA:   Circumferential: Equal. No edema noted today.  PALPATION: No notable palpable pain today.   LOWER EXTREMITY ROM:  Full active left knee range of motion.  The patient's knees are remarkable for genu recurvatum.    LOWER EXTREMITY MMT:  Normal right knee and hip strength.  LOWER EXTREMITY SPECIAL TESTS:  The patient's knee is stable. (-) Anterior Drawer, varus/valgus testing.    GAIT: Normal gait pattern.                                                                                                                                TREATMENT DATE:   10/28/23                                 EXERCISE LOG   LT knee  Exercise Repetitions and Resistance Comments  Recumbent bike Level 2 x 16 minutes   Knee ext 10  # x 3 minutes   Ham curls 40# x 1 minutes   Split squat 14in x 15 quad control   Step down 4in  x 15   Rocker board X 3 mins   LE elevation and vasopneumatic x 15 minutes with IFC  x 15 mins at100% scan to patient's left knee.     Bike L 4 x 15 mins  SAQ's  3# x10 hold 5 secs,   2 x 10 hold 10 secs Reviewed HEP LE elevation and vasopneumatic x 15 minutes with IFC 100% scan to patient's left knee.   10/14/23:                                     EXERCISE LOG  Exercise Repetitions and Resistance Comments  Recumbent bike Level 3 x 15 minutes   Knee ext 10# x 3 minutes   Ham curls 40# x 3 minutes   Leg Press 2 plates x 3 minutes       LE elevation and vasopneumatic x 15 minutes to patient's left knee.  10-09-23                                    EXERCISE LOG  LT knee  Exercise Repetitions and Resistance Comments  Bike X10 mins   Heel ups/Toe ups X fatigue HEP 3 x fatigue  QS  and SLR 3x10 HEP  SL Hip ABD 3x10 HEP  SAQ 3# 2x10 hold 10 secs   Clam shell     Blank cell = exercise not performed today    PATIENT EDUCATION:  Education details: Discussed avoiding activities that reproduce patient's knee pain. Person educated: Patient Education method:  Explanation Education comprehension: verbalized understanding  HOME EXERCISE PROGRAM:   ASSESSMENT:  CLINICAL IMPRESSION: Patient arrived today doing okay, but reports having increased soreness from gym maybe.Rx focused on OKC and CKC therex fo LT knee strengthening. Felt good end of session. LTGs on going    OBJECTIVE IMPAIRMENTS: decreased activity tolerance and pain.   ACTIVITY LIMITATIONS: carrying, lifting, and bending  PARTICIPATION LIMITATIONS: meal prep, cleaning, laundry, and yard work  PERSONAL FACTORS: Time since onset of injury/illness/exacerbation are also affecting patient's functional outcome.   REHAB POTENTIAL: Good  CLINICAL DECISION MAKING: Evolving/moderate complexity  EVALUATION COMPLEXITY: Low   GOALS:  LONG TERM GOALS: Target date: 11/19/23  Ind with a HEP.  Goal status: MET  2.  Perform ADL's with left knee pain not > 2-3/10.  Goal status: Partially met    Intermittent  3.  Walk a community distance with pain not > 2-3/10.  Goal status: Partially met    intermittent   PLAN:  PT FREQUENCY: 2x/week  PT DURATION: 6 weeks  PLANNED INTERVENTIONS: 97110-Therapeutic exercises, 97530- Therapeutic activity, [id number]- Neuromuscular re-education, 97535- Self Care, 16109- Manual therapy, G0283- Electrical stimulation (unattended), 97016- Vasopneumatic device, Patient/Family education, Cryotherapy, and Moist heat  PLAN FOR NEXT SESSION: Pain-free left LE therex.  Modalities as needed.     Vasiliki Smaldone,CHRIS, PTA 10/28/2023, 9:11 AM

## 2023-10-28 NOTE — Telephone Encounter (Signed)
  No triage- pt wanting Vyvanse  refilled. Pt is overwhelmed with having to keep calling office with request. Pt called on 5/5 and still doesn't have refill. Pt has a lot on plate and tired having to circle back. Please advise.          Copied from CRM [id number]. Topic: Clinical - Medication Refill >> Oct 27, 2023  3:02 PM Lizabeth Riggs wrote: Medication: lisdexamfetamine (VYVANSE ) 40 MG capsule  Has the patient contacted their pharmacy? Yes (Agent: If no, request that the patient contact the pharmacy for the refill. If patient does not wish to contact the pharmacy document the reason why and proceed with request.) (Agent: If yes, when and what did the pharmacy advise?) Pharmacy needs order to refill  This is the patient's preferred pharmacy:   [facility] [phone] - [place], [place] - [address] AT [facility] [address] [place] [address] Phone: [phone] Fax: [phone]  Is this the correct pharmacy for this prescription? Yes If no, delete pharmacy and type the correct one.   Has the prescription been filled recently? No  Is the patient out of the medication? Yes  Has the patient been seen for an appointment in the last year OR does the patient have an upcoming appointment? Yes  Can we respond through MyChart? Yes  Agent: Please be advised that Rx refills may take up to 3 business days. We ask that you follow-up with your pharmacy. >> Oct 28, 2023  9:58 AM Jyl Or H wrote: Patient called to follow up on refill request for Vyvanse . Patient had recent neurological event, see 10/21/23 encounter with ED. Patient originally requested refill on 10/26/23. Pending in Meds List.  Reason for Disposition  Health Information question, no triage required and triager able to answer question  Answer Assessment - Initial Assessment Questions 1. REASON FOR CALL or QUESTION: "What is your reason for calling today?" or "How can I best help you?" or "What  question do you have that I can help answer?"     Wants Vyvanse  refilled  Protocols used: Information Only Call - No Triage-A-AH

## 2023-10-29 ENCOUNTER — Encounter: Payer: Self-pay | Admitting: Family Medicine

## 2023-10-29 ENCOUNTER — Ambulatory Visit: Admitting: Family Medicine

## 2023-10-29 VITALS — BP 132/78 | HR 88 | Ht 70.0 in | Wt 160.0 lb

## 2023-10-29 DIAGNOSIS — Z8673 Personal history of transient ischemic attack (TIA), and cerebral infarction without residual deficits: Secondary | ICD-10-CM

## 2023-10-29 NOTE — Progress Notes (Signed)
 BP 132/78   Pulse 88   Ht 5\' 10"  (1.778 m)   Wt 160 lb (72.6 kg)   SpO2 99%   BMI 22.96 kg/m    Subjective:   Patient ID: Candace Allen, female    DOB: [birth year], 58 y.o.   MRN: [id number]  HPI: Candace Allen is a 58 y.o. female presenting on 10/29/2023 for Hospitalization Follow-up   HPI Hospital follow-up Patient is coming in today for hospital follow-up.  She was in the hospital on 10/21/2023 and discharged on 10/22/2023.  She was diagnosed with strokelike/TIA symptoms and acute kidney injury.  Her symptoms were left-sided sensory loss.  CT and MRI were normal.  She was recommended aspirin  81 mg daily.  Patient also had some weakness in her left arm as well but not her left leg.  She said this was very similar to what she had 3 years ago.  It has resolved completely and she has had no other symptoms.  She does admit that she had a lots of stressors including work stressors and her son's wedding and other stressors that were all brought on at the same time.  She thinks those played a factor in it.  Relevant past medical, surgical, family and social history reviewed and updated as indicated. Interim medical history since our last visit reviewed. Allergies and medications reviewed and updated.  Review of Systems  Constitutional:  Negative for chills and fever.  HENT:  Negative for congestion, ear discharge and ear pain.   Eyes:  Negative for redness and visual disturbance.  Respiratory:  Negative for chest tightness and shortness of breath.   Cardiovascular:  Negative for chest pain and leg swelling.  Genitourinary:  Negative for difficulty urinating and dysuria.  Musculoskeletal:  Negative for back pain and gait problem.  Skin:  Negative for rash.  Neurological:  Negative for dizziness, weakness, light-headedness, numbness and headaches.  Psychiatric/Behavioral:  Negative for agitation and behavioral problems.   All other systems reviewed and are negative.   Per HPI unless  specifically indicated above   Allergies as of 10/29/2023       Reactions   Bee Venom Anaphylaxis   Flagyl  [metronidazole ] Anaphylaxis   Iodine Anaphylaxis   Shellfish Allergy Anaphylaxis   Tramadol  Hcl Other (See Comments)   Showed signs of stroke   Wasp Venom Anaphylaxis   Lactose Intolerance (gi) Hives   Bloating   Gluten Meal Other (See Comments)   Bloating    Melatonin Other (See Comments)   Nightmares         Medication List        Accurate as of Oct 29, 2023  3:28 PM. If you have any questions, ask your nurse or doctor.          Adult Gummy Chew Chew 1 capsule by mouth daily.   aspirin  EC 81 MG tablet Take 1 tablet (81 mg total) by mouth daily. Swallow whole.   EPINEPHrine  0.3 mg/0.3 mL Soaj injection Commonly known as: EPI-PEN Inject 0.3 mg into the muscle as needed for anaphylaxis.   famotidine  40 MG tablet Commonly known as: PEPCID  Take 1 tablet (40 mg total) by mouth 2 (two) times daily as needed for heartburn/reflux.   lisdexamfetamine 40 MG capsule Commonly known as: Vyvanse  Take 1 capsule (40 mg total) by mouth every morning.   lisinopril  5 MG tablet Commonly known as: ZESTRIL  Take 0.5 tablets (2.5 mg total) by mouth daily.   montelukast  10 MG tablet Commonly known  as: SINGULAIR  Take 1 tablet (10 mg total) by mouth at bedtime.   UNABLE TO FIND Asparagus drops-daily in water; Liver Life-10 drops daily   venlafaxine  XR 37.5 MG 24 hr capsule Commonly known as: Effexor  XR Take 1 capsule (37.5 mg total) by mouth daily with breakfast.   VITAMIN D-VITAMIN K  PO Take 1 capsule by mouth daily.         Objective:   BP 132/78   Pulse 88   Ht 5\' 10"  (1.778 m)   Wt 160 lb (72.6 kg)   SpO2 99%   BMI 22.96 kg/m   Wt Readings from Last 3 Encounters:  10/29/23 160 lb (72.6 kg)  10/21/23 156 lb 1.4 oz (70.8 kg)  09/19/23 156 lb (70.8 kg)    Physical Exam Vitals and nursing note reviewed.  Constitutional:      General: She is not in  acute distress.    Appearance: She is well-developed. She is not diaphoretic.  HENT:     Right Ear: Tympanic membrane normal.     Left Ear: Tympanic membrane normal.  Eyes:     Extraocular Movements: Extraocular movements intact.     Conjunctiva/sclera: Conjunctivae normal.     Pupils: Pupils are equal, round, and reactive to light.  Cardiovascular:     Rate and Rhythm: Normal rate and regular rhythm.     Heart sounds: Normal heart sounds. No murmur heard. Pulmonary:     Effort: Pulmonary effort is normal. No respiratory distress.     Breath sounds: Normal breath sounds. No wheezing.  Musculoskeletal:        General: No tenderness. Normal range of motion.  Skin:    General: Skin is warm and dry.     Findings: No rash.  Neurological:     Mental Status: She is alert and oriented to person, place, and time.     Cranial Nerves: No cranial nerve deficit.     Sensory: No sensory deficit.     Motor: No weakness.     Coordination: Coordination normal.     Gait: Gait normal.  Psychiatric:        Behavior: Behavior normal.       Assessment & Plan:   Problem List Items Addressed This Visit   None Visit Diagnoses       History of TIA (transient ischemic attack)    -  Primary   Relevant Orders   CBC with Differential/Platelet   CMP14+EGFR   Ambulatory referral to Neurology       Seems like symptoms are still doing well as far as her TIA symptoms have not returned.  Her blood pressure looks good today.  She should continue with the aspirin  and follow-up with neurology.  Recommend she continue to take aspirin .  Has follow-up with PCP soon Follow up plan: Return if symptoms worsen or fail to improve.  Counseling provided for all of the vaccine components Orders Placed This Encounter  Procedures   CBC with Differential/Platelet   CMP14+EGFR   Ambulatory referral to Neurology    Jolyne Needs, MD [facility] 10/29/2023, 3:28 PM

## 2023-10-30 ENCOUNTER — Ambulatory Visit: Admitting: *Deleted

## 2023-10-30 ENCOUNTER — Encounter: Payer: Self-pay | Admitting: *Deleted

## 2023-10-30 DIAGNOSIS — M25562 Pain in left knee: Secondary | ICD-10-CM | POA: Diagnosis not present

## 2023-10-30 DIAGNOSIS — G8929 Other chronic pain: Secondary | ICD-10-CM

## 2023-10-30 LAB — CMP14+EGFR
ALT: 22 IU/L (ref 0–32)
AST: 27 IU/L (ref 0–40)
Albumin: 4.7 g/dL (ref 3.8–4.9)
Alkaline Phosphatase: 110 IU/L (ref 44–121)
BUN/Creatinine Ratio: 13 (ref 9–23)
BUN: 16 mg/dL (ref 6–24)
Bilirubin Total: 0.3 mg/dL (ref 0.0–1.2)
CO2: 24 mmol/L (ref 20–29)
Calcium: 10.3 mg/dL — ABNORMAL HIGH (ref 8.7–10.2)
Chloride: 102 mmol/L (ref 96–106)
Creatinine, Ser: 1.24 mg/dL — ABNORMAL HIGH (ref 0.57–1.00)
Globulin, Total: 2.3 g/dL (ref 1.5–4.5)
Glucose: 92 mg/dL (ref 70–99)
Potassium: 4.9 mmol/L (ref 3.5–5.2)
Sodium: 143 mmol/L (ref 134–144)
Total Protein: 7 g/dL (ref 6.0–8.5)
eGFR: 50 mL/min/1.73 — ABNORMAL LOW

## 2023-10-30 LAB — CBC WITH DIFFERENTIAL/PLATELET
Basophils Absolute: 0.1 10*3/uL (ref 0.0–0.2)
Basos: 1 %
EOS (ABSOLUTE): 0.1 10*3/uL (ref 0.0–0.4)
Eos: 1 %
Hematocrit: 39.6 % (ref 34.0–46.6)
Hemoglobin: 13.3 g/dL (ref 11.1–15.9)
Immature Grans (Abs): 0 10*3/uL (ref 0.0–0.1)
Immature Granulocytes: 0 %
Lymphocytes Absolute: 2.2 10*3/uL (ref 0.7–3.1)
Lymphs: 34 %
MCH: 32.1 pg (ref 26.6–33.0)
MCHC: 33.6 g/dL (ref 31.5–35.7)
MCV: 96 fL (ref 79–97)
Monocytes Absolute: 0.5 10*3/uL (ref 0.1–0.9)
Monocytes: 8 %
Neutrophils Absolute: 3.7 10*3/uL (ref 1.4–7.0)
Neutrophils: 56 %
Platelets: 276 10*3/uL (ref 150–450)
RBC: 4.14 x10E6/uL (ref 3.77–5.28)
RDW: 12.5 % (ref 11.7–15.4)
WBC: 6.5 10*3/uL (ref 3.4–10.8)

## 2023-10-30 NOTE — Therapy (Signed)
 OUTPATIENT PHYSICAL THERAPY LOWER EXTREMITY EVALUATION   Patient Name: Candace Allen MRN: [id number] DOB:June 28, 1965, 58 y.o., female Today's Date: 10/30/2023  END OF SESSION:  PT End of Session - 10/30/23 0818     Visit Number 8    Number of Visits 12    Date for PT Re-Evaluation 11/19/23    PT Start Time 0800    PT Stop Time 0903    PT Time Calculation (min) 63 min             Past Medical History:  Diagnosis Date   Attention deficit disorder (ADD) in adult    Chronic renal insufficiency, stage 3 (moderate) (HCC)    ?d/t dexilant   Diverticulosis 02/19/2018   By colonoscopy 2016   Endometrial cancer (HCC)    GERD (gastroesophageal reflux disease)    Hx of concussion    Approximately 2016   Hypothyroidism    question of-->no hx of thyroid  lab abnormalities in record   Hypothyroidism (acquired) 08/20/2019   IBS (irritable bowel syndrome)    Past Surgical History:  Procedure Laterality Date   CHOLECYSTECTOMY N/A 11/08/2021   Procedure: LAPAROSCOPIC CHOLECYSTECTOMY;  Surgeon: Marijo Shove, DO;  Location: [facility];  Service: General;  Laterality: N/A;   COLONOSCOPY  2016   2016 no polyps. Rpt at [facility] 2022 per pt, normal, no record available   ENDOMETRIAL ABLATION     SHOULDER SURGERY Left    Approximately 2016   TRANSTHORACIC ECHOCARDIOGRAM     01/2021 NORMAL   TUBAL LIGATION     UPPER GASTROINTESTINAL ENDOSCOPY  11/10/2019   neg   Patient Active Problem List   Diagnosis Date Noted   Stroke [facility]) 10/21/2023   AKI (acute kidney injury) (HCC) 10/21/2023   Encounter for screening fecal occult blood testing 09/02/2023   Routine general medical examination at a health care facility 09/02/2023   Environmental and seasonal allergies 09/02/2023   Stage 3a chronic kidney disease (HCC) 07/22/2023   Attention deficit disorder (ADD) in adult 07/17/2023   Decreased GFR 07/17/2023   Pure hypercholesterolemia 07/17/2023   Calculus of gallbladder without  cholecystitis without obstruction    Other fatigue 09/13/2021   TIA (transient ischemic attack) 10/20/2020   Postmenopausal 11/09/2019   Weight loss 11/09/2019   History of endometrial cancer 02/19/2018   GERD (gastroesophageal reflux disease) 02/19/2018   IBS (irritable bowel syndrome) 02/19/2018   Diverticulosis 02/19/2018    REFERRING PROVIDER:  Chrystine Crate  REFERRING DIAG: Chronic pain of left knee.  THERAPY DIAG:  Chronic pain of left knee  Rationale for Evaluation and Treatment: Rehabilitation  ONSET DATE: 8 years.    SUBJECTIVE:   SUBJECTIVE STATEMENT: 2/10 LT knee pain/soreness.    Did okay after last Rx as far as LT knee. Sciatica still bad     PERTINENT HISTORY: Please see above.  Left Sciatica.  PAIN:  Are you having pain? Yes: NPRS scale: 2-3/10. Pain location: Left knee. Pain description: Ache, sore, throbbing, sharp. Aggravating factors: "Everything." Relieving factors: "Nothing."  PRECAUTIONS: Other: No pain increase therex.  WEIGHT BEARING RESTRICTIONS: No  FALLS:  Has patient fallen in last 6 months? No  LIVING ENVIRONMENT: Lives with: lives with their spouse Lives in: House/apartment Has following equipment at home: None  OCCUPATION: Self-employed.    PLOF: Independent  PATIENT GOALS: To do things she wants to do without knee pain.     OBJECTIVE:  Note: Objective measures were completed at Evaluation unless otherwise noted.  DIAGNOSTIC FINDINGS:  09/19/23:  X-ray:  FINDINGS: Frontal view both knees with lateral view of the left knee provided. The alignment and joint spaces are preserved. There is mild peripheral spurring of the medial tibiofemoral and patellofemoral compartments of the left knee. No fracture. No erosions or focal bone abnormality. No joint effusion. Unremarkable soft tissues. Frontal view of the right knee is unremarkable.   IMPRESSION: Mild osteoarthritis of the left knee.  EDEMA:  Circumferential:  Equal. No edema noted today.  PALPATION: No notable palpable pain today.   LOWER EXTREMITY ROM:  Full active left knee range of motion.  The patient's knees are remarkable for genu recurvatum.    LOWER EXTREMITY MMT:  Normal right knee and hip strength.  LOWER EXTREMITY SPECIAL TESTS:  The patient's knee is stable. (-) Anterior Drawer, varus/valgus testing.    GAIT: Normal gait pattern.                                                                                                                                TREATMENT DATE:   10/30/23                                 EXERCISE LOG   LT knee  Exercise Repetitions and Resistance Comments  Recumbent bike Level 2 x 16 minutes   Knee ext 10  # x 3 minutes   Ham curls 40# x 1 minutes   Split squat 14in x 15 quad control   Step down 4in  x 15   Rocker board X 3 mins   LE elevation and vasopneumatic x 15 minutes with IFC  x 15 mins at100% scan to patient's left knee.     Bike L 4 x 15 mins  SAQ's  3# x10 hold 5 secs,   2 x 10 hold 10 secs Reviewed HEP LE elevation and vasopneumatic x 15 minutes with IFC 100% scan to patient's left knee.   10/14/23:                                     EXERCISE LOG  Exercise Repetitions and Resistance Comments  Recumbent bike Level 3 x 15 minutes   Knee ext 10# x 3 minutes   Ham curls 40# x 3 minutes   Leg Press 2 plates x 3 minutes       LE elevation and vasopneumatic x 15 minutes to patient's left knee.  10-09-23                                    EXERCISE LOG  LT knee  Exercise Repetitions and Resistance Comments  Bike X10 mins   Heel ups/Toe ups X fatigue HEP 3 x fatigue  QS and SLR  3x10 HEP  SL Hip ABD 3x10 HEP  SAQ 3# 2x10 hold 10 secs   Clam shell     Blank cell = exercise not performed today    PATIENT EDUCATION:  Education details: Discussed avoiding activities that reproduce patient's knee pain. Person educated: Patient Education method: Explanation Education  comprehension: verbalized understanding  HOME EXERCISE PROGRAM:   ASSESSMENT:  CLINICAL IMPRESSION: Patient arrived today doing better with decreased LT knee pain and was able to continue with CKC and OKC therex for strengthening, but also with modifications to not increase sciatica pain.   OBJECTIVE IMPAIRMENTS: decreased activity tolerance and pain.   ACTIVITY LIMITATIONS: carrying, lifting, and bending  PARTICIPATION LIMITATIONS: meal prep, cleaning, laundry, and yard work  PERSONAL FACTORS: Time since onset of injury/illness/exacerbation are also affecting patient's functional outcome.   REHAB POTENTIAL: Good  CLINICAL DECISION MAKING: Evolving/moderate complexity  EVALUATION COMPLEXITY: Low   GOALS:  LONG TERM GOALS: Target date: 11/19/23  Ind with a HEP.  Goal status: MET  2.  Perform ADL's with left knee pain not > 2-3/10.  Goal status: Partially met    Intermittent  3.  Walk a community distance with pain not > 2-3/10.  Goal status: Partially met    intermittent   PLAN:  PT FREQUENCY: 2x/week  PT DURATION: 6 weeks  PLANNED INTERVENTIONS: 97110-Therapeutic exercises, 97530- Therapeutic activity, [id number]- Neuromuscular re-education, 97535- Self Care, 16109- Manual therapy, G0283- Electrical stimulation (unattended), 97016- Vasopneumatic device, Patient/Family education, Cryotherapy, and Moist heat  PLAN FOR NEXT SESSION: Pain-free left LE therex.  Modalities as needed.     Jahari Wiginton,CHRIS, PTA 10/30/2023, 11:10 AM

## 2023-11-03 ENCOUNTER — Ambulatory Visit (HOSPITAL_COMMUNITY)
Admission: RE | Admit: 2023-11-03 | Discharge: 2023-11-03 | Disposition: A | Discharge status: Home / Self Care | Payer: Self-pay | Source: Ambulatory Visit | Attending: Nephrology | Admitting: Nephrology

## 2023-11-03 ENCOUNTER — Other Ambulatory Visit: Payer: Self-pay | Admitting: Family Medicine

## 2023-11-03 DIAGNOSIS — N1831 Chronic kidney disease, stage 3a: Secondary | ICD-10-CM | POA: Diagnosis present

## 2023-11-03 DIAGNOSIS — R944 Abnormal results of kidney function studies: Secondary | ICD-10-CM | POA: Insufficient documentation

## 2023-11-03 DIAGNOSIS — N189 Chronic kidney disease, unspecified: Secondary | ICD-10-CM | POA: Diagnosis not present

## 2023-11-04 ENCOUNTER — Ambulatory Visit: Admitting: *Deleted

## 2023-11-04 ENCOUNTER — Encounter: Payer: Self-pay | Admitting: *Deleted

## 2023-11-04 DIAGNOSIS — M25562 Pain in left knee: Secondary | ICD-10-CM | POA: Diagnosis not present

## 2023-11-04 DIAGNOSIS — G8929 Other chronic pain: Secondary | ICD-10-CM

## 2023-11-04 NOTE — Therapy (Signed)
 OUTPATIENT PHYSICAL THERAPY LOWER EXTREMITY TREATMENT   Patient Name: Candace Allen MRN: [id number] DOB:[birth year], 58 y.o., female Today's Date: 11/04/2023  END OF SESSION:  PT End of Session - 11/04/23 1027     Visit Number 9    Number of Visits 12    Date for PT Re-Evaluation 11/19/23    PT Start Time 1015    PT Stop Time 1115    PT Time Calculation (min) 60 min             Past Medical History:  Diagnosis Date   Attention deficit disorder (ADD) in adult    Chronic renal insufficiency, stage 3 (moderate) (HCC)    ?d/t dexilant   Diverticulosis 02/19/2018   By colonoscopy 2016   Endometrial cancer (HCC)    GERD (gastroesophageal reflux disease)    Hx of concussion    Approximately 2016   Hypothyroidism    question of-->no hx of thyroid  lab abnormalities in record   Hypothyroidism (acquired) 08/20/2019   IBS (irritable bowel syndrome)    Past Surgical History:  Procedure Laterality Date   CHOLECYSTECTOMY N/A 11/08/2021   Procedure: LAPAROSCOPIC CHOLECYSTECTOMY;  Surgeon: Marijo Shove, DO;  Location: [facility];  Service: General;  Laterality: N/A;   COLONOSCOPY  2016   2016 no polyps. Rpt at [facility] 2022 per pt, normal, no record available   ENDOMETRIAL ABLATION     SHOULDER SURGERY Left    Approximately 2016   TRANSTHORACIC ECHOCARDIOGRAM     01/2021 NORMAL   TUBAL LIGATION     UPPER GASTROINTESTINAL ENDOSCOPY  11/10/2019   neg   Patient Active Problem List   Diagnosis Date Noted   Stroke [facility]) 10/21/2023   AKI (acute kidney injury) (HCC) 10/21/2023   Encounter for screening fecal occult blood testing 09/02/2023   Routine general medical examination at a health care facility 09/02/2023   Environmental and seasonal allergies 09/02/2023   Stage 3a chronic kidney disease (HCC) 07/22/2023   Attention deficit disorder (ADD) in adult 07/17/2023   Decreased GFR 07/17/2023   Pure hypercholesterolemia 07/17/2023   Calculus of gallbladder without  cholecystitis without obstruction    Other fatigue 09/13/2021   TIA (transient ischemic attack) 10/20/2020   Postmenopausal 11/09/2019   Weight loss 11/09/2019   History of endometrial cancer 02/19/2018   GERD (gastroesophageal reflux disease) 02/19/2018   IBS (irritable bowel syndrome) 02/19/2018   Diverticulosis 02/19/2018    REFERRING PROVIDER:  Chrystine Crate  REFERRING DIAG: Chronic pain of left knee.  THERAPY DIAG:  Chronic pain of left knee  Rationale for Evaluation and Treatment: Rehabilitation  ONSET DATE: 8 years.    SUBJECTIVE:   SUBJECTIVE STATEMENT: 1-2/10 LT knee pain/soreness.    Did okay after last Rx as far as LT knee. Sciatica better today     PERTINENT HISTORY: Please see above.  Left Sciatica.  PAIN:  Are you having pain? Yes: NPRS scale: 1-2/10. Pain location: Left knee. Pain description: Ache, sore, throbbing, sharp. Aggravating factors: "Everything." Relieving factors: "Nothing."  PRECAUTIONS: Other: No pain increase therex.  WEIGHT BEARING RESTRICTIONS: No  FALLS:  Has patient fallen in last 6 months? No  LIVING ENVIRONMENT: Lives with: lives with their spouse Lives in: House/apartment Has following equipment at home: None  OCCUPATION: Self-employed.    PLOF: Independent  PATIENT GOALS: To do things she wants to do without knee pain.     OBJECTIVE:  Note: Objective measures were completed at Evaluation unless otherwise noted.  DIAGNOSTIC FINDINGS:  09/19/23:  X-ray:  FINDINGS: Frontal view both knees with lateral view of the left knee provided. The alignment and joint spaces are preserved. There is mild peripheral spurring of the medial tibiofemoral and patellofemoral compartments of the left knee. No fracture. No erosions or focal bone abnormality. No joint effusion. Unremarkable soft tissues. Frontal view of the right knee is unremarkable.   IMPRESSION: Mild osteoarthritis of the left knee.  EDEMA:   Circumferential: Equal. No edema noted today.  PALPATION: No notable palpable pain today.   LOWER EXTREMITY ROM:  Full active left knee range of motion.  The patient's knees are remarkable for genu recurvatum.    LOWER EXTREMITY MMT:  Normal right knee and hip strength.  LOWER EXTREMITY SPECIAL TESTS:  The patient's knee is stable. (-) Anterior Drawer, varus/valgus testing.    GAIT: Normal gait pattern.                                                                                                                                TREATMENT DATE:   11/04/23                                 EXERCISE LOG   LT knee  Exercise Repetitions and Resistance Comments  Recumbent bike Level 2 x 16 minutes   Knee ext 20 # x 4 minutes   Ham curls 30# x  4 minutes   Split squat 14in 3x10  quad control   Step down 4in    3x10   Rocker board X 3 mins   LE elevation and vasopneumatic x 15 minutes with IFC  x 15 mins at100% scan to patient's left knee.     Bike L 4 x 15 mins  SAQ's  3# x10 hold 5 secs,   2 x 10 hold 10 secs Reviewed HEP LE elevation and vasopneumatic x 15 minutes with IFC 100% scan to patient's left knee.   10/14/23:                                     EXERCISE LOG  Exercise Repetitions and Resistance Comments  Recumbent bike Level 3 x 15 minutes   Knee ext 10# x 3 minutes   Ham curls 40# x 3 minutes   Leg Press 2 plates x 3 minutes       LE elevation and vasopneumatic x 15 minutes to patient's left knee.  10-09-23                                    EXERCISE LOG  LT knee  Exercise Repetitions and Resistance Comments  Bike X10 mins   Heel ups/Toe ups X fatigue HEP 3 x fatigue  QS and  SLR 3x10 HEP  SL Hip ABD 3x10 HEP  SAQ 3# 2x10 hold 10 secs   Clam shell     Blank cell = exercise not performed today    PATIENT EDUCATION:  Education details: Discussed avoiding activities that reproduce patient's knee pain. Person educated: Patient Education method:  Explanation Education comprehension: verbalized understanding  HOME EXERCISE PROGRAM:   ASSESSMENT:  CLINICAL IMPRESSION: Patient arrived today doing better with decreased LT knee pain 1-2/10 and was able to continue with LT LE therex  OKC and CKC for strengthening without flare-ups. 2-3/10 end of session.   OBJECTIVE IMPAIRMENTS: decreased activity tolerance and pain.   ACTIVITY LIMITATIONS: carrying, lifting, and bending  PARTICIPATION LIMITATIONS: meal prep, cleaning, laundry, and yard work  PERSONAL FACTORS: Time since onset of injury/illness/exacerbation are also affecting patient's functional outcome.   REHAB POTENTIAL: Good  CLINICAL DECISION MAKING: Evolving/moderate complexity  EVALUATION COMPLEXITY: Low   GOALS:  LONG TERM GOALS: Target date: 11/19/23  Ind with a HEP.  Goal status: MET  2.  Perform ADL's with left knee pain not > 2-3/10.  Goal status: Partially met    Intermittent  3.  Walk a community distance with pain not > 2-3/10.  Goal status: Partially met    intermittent   PLAN:  PT FREQUENCY: 2x/week  PT DURATION: 6 weeks  PLANNED INTERVENTIONS: 97110-Therapeutic exercises, 97530- Therapeutic activity, [id number]- Neuromuscular re-education, 97535- Self Care, [id number]- Manual therapy, [id number]- Electrical stimulation (unattended), 97016- Vasopneumatic device, Patient/Family education, Cryotherapy, and Moist heat  PLAN FOR NEXT SESSION: Pain-free left LE therex.  Modalities as needed.     Ivyanna Sibert,CHRIS, PTA 11/04/2023, 12:59 PM

## 2023-11-05 ENCOUNTER — Ambulatory Visit: Payer: Self-pay | Admitting: Family Medicine

## 2023-11-06 ENCOUNTER — Encounter: Payer: Self-pay | Admitting: *Deleted

## 2023-11-06 ENCOUNTER — Ambulatory Visit: Admitting: *Deleted

## 2023-11-06 DIAGNOSIS — M25562 Pain in left knee: Secondary | ICD-10-CM | POA: Diagnosis not present

## 2023-11-06 DIAGNOSIS — G8929 Other chronic pain: Secondary | ICD-10-CM

## 2023-11-06 NOTE — Therapy (Signed)
 OUTPATIENT PHYSICAL THERAPY LOWER EXTREMITY TREATMENT   Patient Name: Candace Allen MRN: [id number] DOB:[birth year], 58 y.o., female Today's Date: 11/06/2023  END OF SESSION:  PT End of Session - 11/06/23 0820     Visit Number 10    Number of Visits 12    Date for PT Re-Evaluation 11/19/23    PT Start Time 0821    PT Stop Time 0900    PT Time Calculation (min) 39 min             Past Medical History:  Diagnosis Date   Attention deficit disorder (ADD) in adult    Chronic renal insufficiency, stage 3 (moderate) (HCC)    ?d/t dexilant   Diverticulosis 02/19/2018   By colonoscopy 2016   Endometrial cancer (HCC)    GERD (gastroesophageal reflux disease)    Hx of concussion    Approximately 2016   Hypothyroidism    question of-->no hx of thyroid  lab abnormalities in record   Hypothyroidism (acquired) 08/20/2019   IBS (irritable bowel syndrome)    Past Surgical History:  Procedure Laterality Date   CHOLECYSTECTOMY N/A 11/08/2021   Procedure: LAPAROSCOPIC CHOLECYSTECTOMY;  Surgeon: Marijo Shove, DO;  Location: [facility];  Service: General;  Laterality: N/A;   COLONOSCOPY  2016   2016 no polyps. Rpt at [facility] 2022 per pt, normal, no record available   ENDOMETRIAL ABLATION     SHOULDER SURGERY Left    Approximately 2016   TRANSTHORACIC ECHOCARDIOGRAM     01/2021 NORMAL   TUBAL LIGATION     UPPER GASTROINTESTINAL ENDOSCOPY  11/10/2019   neg   Patient Active Problem List   Diagnosis Date Noted   Stroke [facility]) 10/21/2023   AKI (acute kidney injury) (HCC) 10/21/2023   Encounter for screening fecal occult blood testing 09/02/2023   Routine general medical examination at a health care facility 09/02/2023   Environmental and seasonal allergies 09/02/2023   Stage 3a chronic kidney disease (HCC) 07/22/2023   Attention deficit disorder (ADD) in adult 07/17/2023   Decreased GFR 07/17/2023   Pure hypercholesterolemia 07/17/2023   Calculus of gallbladder without  cholecystitis without obstruction    Other fatigue 09/13/2021   TIA (transient ischemic attack) 10/20/2020   Postmenopausal 11/09/2019   Weight loss 11/09/2019   History of endometrial cancer 02/19/2018   GERD (gastroesophageal reflux disease) 02/19/2018   IBS (irritable bowel syndrome) 02/19/2018   Diverticulosis 02/19/2018    REFERRING PROVIDER:  Chrystine Crate  REFERRING DIAG: Chronic pain of left knee.  THERAPY DIAG:  Chronic pain of left knee  Rationale for Evaluation and Treatment: Rehabilitation  ONSET DATE: 8 years.    SUBJECTIVE:   SUBJECTIVE STATEMENT:Running late today 1-2/10 LT knee pain/soreness this AM. On fire later in the day after gym workout.   Sciatica not good today     PERTINENT HISTORY: Please see above.  Left Sciatica.  PAIN:  Are you having pain? Yes: NPRS scale: 1-2/10. Pain location: Left knee. Pain description: Ache, sore, throbbing, sharp. Aggravating factors: "Everything." Relieving factors: "Nothing."  PRECAUTIONS: Other: No pain increase therex.  WEIGHT BEARING RESTRICTIONS: No  FALLS:  Has patient fallen in last 6 months? No  LIVING ENVIRONMENT: Lives with: lives with their spouse Lives in: House/apartment Has following equipment at home: None  OCCUPATION: Self-employed.    PLOF: Independent  PATIENT GOALS: To do things she wants to do without knee pain.     OBJECTIVE:  Note: Objective measures were completed at Evaluation unless otherwise noted.  DIAGNOSTIC FINDINGS: 09/19/23:  X-ray:  FINDINGS: Frontal view both knees with lateral view of the left knee provided. The alignment and joint spaces are preserved. There is mild peripheral spurring of the medial tibiofemoral and patellofemoral compartments of the left knee. No fracture. No erosions or focal bone abnormality. No joint effusion. Unremarkable soft tissues. Frontal view of the right knee is unremarkable.   IMPRESSION: Mild osteoarthritis of the left  knee.  EDEMA:  Circumferential: Equal. No edema noted today.  PALPATION: No notable palpable pain today.   LOWER EXTREMITY ROM:  Full active left knee range of motion.  The patient's knees are remarkable for genu recurvatum.    LOWER EXTREMITY MMT:  Normal right knee and hip strength.  LOWER EXTREMITY SPECIAL TESTS:  The patient's knee is stable. (-) Anterior Drawer, varus/valgus testing.    GAIT: Normal gait pattern.                                                                                                                                TREATMENT DATE:   11/06/23                                 EXERCISE LOG   LT knee  Exercise Repetitions and Resistance Comments  Recumbent bike Level 4 x   Knee ext 20 # x 4 minutes   Ham curls 30# x  4 minutes   Split squat    Step down    Rocker board    LE elevation and vasopneumatic x 15 minutes with IFC  x 15 mins at100% scan to patient's left knee.     Bike L 4 x 15 mins  SAQ's  3# x10 hold 5 secs,   2 x 10 hold 10 secs Reviewed HEP LE elevation and vasopneumatic x 15 minutes with IFC 100% scan to patient's left knee.   10/14/23:                                     EXERCISE LOG  Exercise Repetitions and Resistance Comments  Recumbent bike Level 3 x 15 minutes   Knee ext 10# x 3 minutes   Ham curls 40# x 3 minutes   Leg Press 2 plates x 3 minutes       LE elevation and vasopneumatic x 15 minutes to patient's left knee.  10-09-23                                    EXERCISE LOG  LT knee  Exercise Repetitions and Resistance Comments  Bike X10 mins   Heel ups/Toe ups X fatigue HEP 3 x fatigue  QS and SLR 3x10 HEP  SL Hip ABD 3x10 HEP  SAQ 3# 2x10 hold 10 secs   Clam shell     Blank cell = exercise not performed today    PATIENT EDUCATION:  Education details: Discussed avoiding activities that reproduce patient's knee pain. Person educated: Patient Education method: Explanation Education  comprehension: verbalized understanding  HOME EXERCISE PROGRAM:   ASSESSMENT:  CLINICAL IMPRESSION: Patient arrived today running late and sciatica flared up, but doing okay with LT knee pain 1-2/10.  Therex  time was modified today due to time limits but was able to perform some act.'s and did well.. 2-3/10 end of session.   OBJECTIVE IMPAIRMENTS: decreased activity tolerance and pain.   ACTIVITY LIMITATIONS: carrying, lifting, and bending  PARTICIPATION LIMITATIONS: meal prep, cleaning, laundry, and yard work  PERSONAL FACTORS: Time since onset of injury/illness/exacerbation are also affecting patient's functional outcome.   REHAB POTENTIAL: Good  CLINICAL DECISION MAKING: Evolving/moderate complexity  EVALUATION COMPLEXITY: Low   GOALS:  LONG TERM GOALS: Target date: 11/19/23  Ind with a HEP.  Goal status: MET  2.  Perform ADL's with left knee pain not > 2-3/10.  Goal status: Partially met    Intermittent  3.  Walk a community distance with pain not > 2-3/10.  Goal status: Partially met    intermittent   PLAN:  PT FREQUENCY: 2x/week  PT DURATION: 6 weeks  PLANNED INTERVENTIONS: 97110-Therapeutic exercises, 97530- Therapeutic activity, [id number]- Neuromuscular re-education, 97535- Self Care, [id number]- Manual therapy, [id number]- Electrical stimulation (unattended), 97016- Vasopneumatic device, Patient/Family education, Cryotherapy, and Moist heat  PLAN FOR NEXT SESSION: Pain-free left LE therex.  Modalities as needed.     Kanasia Gayman,CHRIS, PTA 11/06/2023, 10:14 AM

## 2023-11-07 ENCOUNTER — Encounter: Payer: Self-pay | Admitting: Family Medicine

## 2023-11-07 ENCOUNTER — Ambulatory Visit (INDEPENDENT_AMBULATORY_CARE_PROVIDER_SITE_OTHER): Admitting: Family Medicine

## 2023-11-07 ENCOUNTER — Ambulatory Visit (INDEPENDENT_AMBULATORY_CARE_PROVIDER_SITE_OTHER)

## 2023-11-07 VITALS — BP 136/76 | HR 70 | Temp 98.2°F | Ht 70.0 in | Wt 160.0 lb

## 2023-11-07 DIAGNOSIS — N1831 Chronic kidney disease, stage 3a: Secondary | ICD-10-CM

## 2023-11-07 DIAGNOSIS — Z8673 Personal history of transient ischemic attack (TIA), and cerebral infarction without residual deficits: Secondary | ICD-10-CM | POA: Diagnosis not present

## 2023-11-07 DIAGNOSIS — G8929 Other chronic pain: Secondary | ICD-10-CM

## 2023-11-07 DIAGNOSIS — R936 Abnormal findings on diagnostic imaging of limbs: Secondary | ICD-10-CM | POA: Diagnosis not present

## 2023-11-07 DIAGNOSIS — F988 Other specified behavioral and emotional disorders with onset usually occurring in childhood and adolescence: Secondary | ICD-10-CM

## 2023-11-07 DIAGNOSIS — M25531 Pain in right wrist: Secondary | ICD-10-CM

## 2023-11-07 MED ORDER — DICLOFENAC SODIUM 1 % EX GEL: 2.0000 g | Freq: Four times a day (QID) | CUTANEOUS | 0 refills | Status: AC

## 2023-11-07 NOTE — Progress Notes (Signed)
 Subjective:  Patient ID: Candace Allen, female    DOB: 1966/02/23, 58 y.o.   MRN: [id number]  Patient Care Team: Yevette Hem, FNP as PCP - General (Family Medicine) Shamleffer, Julian Obey, MD as Consulting Physician (Endocrinology) Armbruster, Lendon Queen, MD as Consulting Physician (Gastroenterology) Odie Benne, MD as Consulting Physician (Cardiology) Pappayliou, Arie Begin, DO as Consulting Physician (General Surgery)   Chief Complaint:  Medical Management of Chronic Issues  HPI: Candace Allen is a 58 y.o. female presenting on 11/07/2023 for Medical Management of Chronic Issues  HPI History of TIA  Doing well, no residual. Plans to follow up with new PCP in June. She is taking aspirin  81 mg. Referred to neurology. Stopped lisinopril  per recommendations. She is wondering about restarting that.  She is established with Carrolyn Clan, MD for nephrology. Completed US  Renal. She has follow up with him on Monday. She is checking BP at home. 130s-140/80s.  States that she is stressed financially and wonders if that contributed to her BP and her TIA. She is also curious about switching from vyvanse  to another medication or holistic therapy.   Right Wrist Pain  States that it is worse. She has pain along the line of her thumb. States that it will be sore to the touch. Denies trauma. She denies numbness and tingling. Trying holistic cream that is helping some. Describes it as sharp and intermittent, usually with movement and lifting and sometimes at rest.  Relevant past medical, surgical, family, and social history reviewed and updated as indicated.  Allergies and medications reviewed and updated. Data reviewed: Chart in Epic.   Past Medical History:  Diagnosis Date   Attention deficit disorder (ADD) in adult    Chronic renal insufficiency, stage 3 (moderate) (HCC)    ?d/t dexilant   Diverticulosis 02/19/2018   By colonoscopy 2016   Endometrial cancer (HCC)    GERD  (gastroesophageal reflux disease)    Hx of concussion    Approximately 2016   Hypothyroidism    question of-->no hx of thyroid  lab abnormalities in record   Hypothyroidism (acquired) 08/20/2019   IBS (irritable bowel syndrome)     Past Surgical History:  Procedure Laterality Date   CHOLECYSTECTOMY N/A 11/08/2021   Procedure: LAPAROSCOPIC CHOLECYSTECTOMY;  Surgeon: Marijo Shove, DO;  Location: [facility];  Service: General;  Laterality: N/A;   COLONOSCOPY  2016   2016 no polyps. Rpt at [facility] 2022 per pt, normal, no record available   ENDOMETRIAL ABLATION     SHOULDER SURGERY Left    Approximately 2016   TRANSTHORACIC ECHOCARDIOGRAM     01/2021 NORMAL   TUBAL LIGATION     UPPER GASTROINTESTINAL ENDOSCOPY  11/10/2019   neg    Social History   Socioeconomic History   Marital status: Married    Spouse name: Not on file   Number of children: 2   Years of education: Not on file   Highest education level: Associate degree: occupational, Scientist, product/process development, or vocational program  Occupational History   Occupation: Oceanographer  Tobacco Use   Smoking status: Never    Passive exposure: Never   Smokeless tobacco: Never  Vaping Use   Vaping status: Never Used  Substance and Sexual Activity   Alcohol use: Yes    Alcohol/week: 2.0 standard drinks of alcohol    Types: 2 Standard drinks or equivalent per week    Comment: social   Drug use: Never   Sexual activity: Yes    Birth  control/protection: Surgical    Comment: tubal & ablation  Other Topics Concern   Not on file  Social History Narrative   Married, 2 children   Educ: 12+.   Grew up in [place].  [place] area approximately 2016.   Occup: publisher->[employer]   No tob   Social Drivers of Corporate investment banker Strain: Low Risk  (09/02/2023)   Overall Financial Resource Strain (CARDIA)    Difficulty of Paying Living Expenses: Not very hard  Food Insecurity: No Food Insecurity (10/21/2023)   Hunger  Vital Sign    Worried About Running Out of Food in the Last Year: Never true    Ran Out of Food in the Last Year: Never true  Transportation Needs: No Transportation Needs (10/21/2023)   PRAPARE - Administrator, Civil Service (Medical): No    Lack of Transportation (Non-Medical): No  Physical Activity: Sufficiently Active (09/02/2023)   Exercise Vital Sign    Days of Exercise per Week: 5 days    Minutes of Exercise per Session: 90 min  Stress: Stress Concern Present (09/02/2023)   [employer] of Occupational Health - Occupational Stress Questionnaire    Feeling of Stress : To some extent  Social Connections: Moderately Integrated (09/02/2023)   Social Connection and Isolation Panel [NHANES]    Frequency of Communication with Friends and Family: More than three times a week    Frequency of Social Gatherings with Friends and Family: Once a week    Attends Religious Services: Never    Database administrator or Organizations: Yes    Attends Engineer, structural: More than 4 times per year    Marital Status: Married  Catering manager Violence: Not At Risk (10/21/2023)   Humiliation, Afraid, Rape, and Kick questionnaire    Fear of Current or Ex-Partner: No    Emotionally Abused: No    Physically Abused: No    Sexually Abused: No    Outpatient Encounter Medications as of 11/07/2023  Medication Sig   aspirin  EC 81 MG tablet Take 1 tablet (81 mg total) by mouth daily. Swallow whole.   EPINEPHrine  0.3 mg/0.3 mL IJ SOAJ injection Inject 0.3 mg into the muscle as needed for anaphylaxis.   famotidine  (PEPCID ) 40 MG tablet Take 1 tablet (40 mg total) by mouth 2 (two) times daily as needed for heartburn/reflux.   lisdexamfetamine (VYVANSE ) 40 MG capsule Take 1 capsule (40 mg total) by mouth every morning.   lisinopril  (ZESTRIL ) 5 MG tablet Take 0.5 tablets (2.5 mg total) by mouth daily.   montelukast  (SINGULAIR ) 10 MG tablet Take 1 tablet (10 mg total) by mouth at bedtime.    Multiple Vitamins-Minerals (ADULT GUMMY) CHEW Chew 1 capsule by mouth daily.   UNABLE TO FIND Asparagus drops-daily in water; Liver Life-10 drops daily   venlafaxine  XR (EFFEXOR -XR) 37.5 MG 24 hr capsule TAKE 1 CAPSULE(37.5 MG) BY MOUTH DAILY WITH BREAKFAST   VITAMIN D-VITAMIN K  PO Take 1 capsule by mouth daily.   No facility-administered encounter medications on file as of 11/07/2023.    Allergies  Allergen Reactions   Bee Venom Anaphylaxis   Flagyl  [Metronidazole ] Anaphylaxis   Iodine Anaphylaxis   Shellfish Allergy Anaphylaxis   Tramadol  Hcl Other (See Comments)    Showed signs of stroke   Wasp Venom Anaphylaxis   Lactose Intolerance (Gi) Hives    Bloating   Gluten Meal Other (See Comments)    Bloating    Melatonin Other (See Comments)  Nightmares    Review of Systems As per HPI  Objective:  BP 136/76   Pulse 70   Temp 98.2 F (36.8 C)   Ht 5\' 10"  (1.778 m)   Wt 160 lb (72.6 kg)   SpO2 100%   BMI 22.96 kg/m    Wt Readings from Last 3 Encounters:  11/07/23 160 lb (72.6 kg)  10/29/23 160 lb (72.6 kg)  10/21/23 156 lb 1.4 oz (70.8 kg)   Physical Exam Constitutional:      General: She is awake. She is not in acute distress.    Appearance: Normal appearance. She is well-developed and well-groomed. She is not ill-appearing, toxic-appearing or diaphoretic.  Cardiovascular:     Rate and Rhythm: Normal rate and regular rhythm.     Pulses: Normal pulses.          Radial pulses are 2+ on the right side and 2+ on the left side.       Posterior tibial pulses are 2+ on the right side and 2+ on the left side.     Heart sounds: Normal heart sounds. No murmur heard.    No gallop.  Pulmonary:     Effort: Pulmonary effort is normal. No respiratory distress.     Breath sounds: Normal breath sounds. No stridor. No wheezing, rhonchi or rales.  Musculoskeletal:     Cervical back: Full passive range of motion without pain and neck supple.     Right lower leg: No edema.     Left  lower leg: No edema.  Skin:    General: Skin is warm.     Capillary Refill: Capillary refill takes less than 2 seconds.  Neurological:     General: No focal deficit present.     Mental Status: She is alert, oriented to person, place, and time and easily aroused. Mental status is at baseline.     GCS: GCS eye subscore is 4. GCS verbal subscore is 5. GCS motor subscore is 6.     Cranial Nerves: No cranial nerve deficit, dysarthria or facial asymmetry.     Motor: No weakness, tremor, abnormal muscle tone or pronator drift.     Coordination: Romberg sign negative. Coordination normal. Finger-Nose-Finger Test and Heel to [facility] Test normal.     Gait: Gait and tandem walk normal.     Comments: Grip strength 5+ bilaterally  Arm strength 5+ bilaterally    Psychiatric:        Attention and Perception: Attention and perception normal.        Mood and Affect: Mood and affect normal.        Speech: Speech normal.        Behavior: Behavior normal. Behavior is cooperative.        Thought Content: Thought content normal. Thought content does not include homicidal or suicidal ideation. Thought content does not include homicidal or suicidal plan.        Cognition and Memory: Cognition and memory normal.        Judgment: Judgment normal.     Results for orders placed or performed in visit on 10/29/23  CBC with Differential/Platelet   Collection Time: 10/29/23  3:35 PM  Result Value Ref Range   WBC 6.5 3.4 - 10.8 x10E3/uL   RBC 4.14 3.77 - 5.28 x10E6/uL   Hemoglobin 13.3 11.1 - 15.9 g/dL   Hematocrit 16.1 09.6 - 46.6 %   MCV 96 79 - 97 fL   MCH 32.1 26.6 - 33.0 pg  MCHC 33.6 31.5 - 35.7 g/dL   RDW 16.1 09.6 - 04.5 %   Platelets 276 150 - 450 x10E3/uL   Neutrophils 56 Not Estab. %   Lymphs 34 Not Estab. %   Monocytes 8 Not Estab. %   Eos 1 Not Estab. %   Basos 1 Not Estab. %   Neutrophils Absolute 3.7 1.4 - 7.0 x10E3/uL   Lymphocytes Absolute 2.2 0.7 - 3.1 x10E3/uL   Monocytes Absolute 0.5  0.1 - 0.9 x10E3/uL   EOS (ABSOLUTE) 0.1 0.0 - 0.4 x10E3/uL   Basophils Absolute 0.1 0.0 - 0.2 x10E3/uL   Immature Granulocytes 0 Not Estab. %   Immature Grans (Abs) 0.0 0.0 - 0.1 x10E3/uL  CMP14+EGFR   Collection Time: 10/29/23  3:35 PM  Result Value Ref Range   Glucose 92 70 - 99 mg/dL   BUN 16 6 - 24 mg/dL   Creatinine, Ser 4.09 (H) 0.57 - 1.00 mg/dL   eGFR 50 (L) >81 XB/JYN/8.29   BUN/Creatinine Ratio 13 9 - 23   Sodium 143 134 - 144 mmol/L   Potassium 4.9 3.5 - 5.2 mmol/L   Chloride 102 96 - 106 mmol/L   CO2 24 20 - 29 mmol/L   Calcium 10.3 (H) 8.7 - 10.2 mg/dL   Total Protein 7.0 6.0 - 8.5 g/dL   Albumin 4.7 3.8 - 4.9 g/dL   Globulin, Total 2.3 1.5 - 4.5 g/dL   Bilirubin Total 0.3 0.0 - 1.2 mg/dL   Alkaline Phosphatase 110 44 - 121 IU/L   AST 27 0 - 40 IU/L   ALT 22 0 - 32 IU/L       11/07/2023   11:19 AM 10/29/2023    3:07 PM 09/19/2023    8:13 AM 09/03/2023    9:03 AM 09/02/2023    9:17 AM  Depression screen PHQ 2/9  Decreased Interest 0 0 0 0 0  Down, Depressed, Hopeless 0 0 0 0 0  PHQ - 2 Score 0 0 0 0 0  Altered sleeping     0  Tired, decreased energy     0  Change in appetite     0  Feeling bad or failure about yourself      0  Trouble concentrating     1  Moving slowly or fidgety/restless     0  Suicidal thoughts     0  PHQ-9 Score     1       11/07/2023   11:20 AM 09/19/2023    8:13 AM 09/03/2023    9:04 AM 09/02/2023    9:17 AM  GAD 7 : Generalized Anxiety Score  Nervous, Anxious, on Edge 0 0 0 0  Control/stop worrying 0 0 0 0  Worry too much - different things 0 0 0 0  Trouble relaxing 0 0 0 0  Restless 0 0 0 0  Easily annoyed or irritable 0 0 0 0  Afraid - awful might happen 0 0 0 0  Total GAD 7 Score 0 0 0 0  Anxiety Difficulty  Not difficult at all Not difficult at all    Pertinent labs & imaging results that were available during my care of the patient were reviewed by me and considered in my medical decision making.  Assessment & Plan:   Candace Allen was seen today for medical management of chronic issues.  Diagnoses and all orders for this visit:  1. Chronic pain of right wrist (Primary) Will start medication as below as  trying to avoid oral NSAIDs and patient recently completed course of prednisone  for sciatic pain. Continue heat and ice at home. Labs and imaging as below. Will communicate results to patient once available. Will await results to determine next steps.  - diclofenac Sodium (VOLTAREN) 1 % GEL; Apply 2 g topically 4 (four) times daily.  Dispense: 2 g; Refill: 0 - Uric Acid - DG Wrist Complete Right  2. Stage 3a chronic kidney disease (HCC) Established with [facility]. Will request records. Recommend patient follow up on Monday. Will restart lisinopril  2.5 mg daily.   3. Attention deficit disorder (ADD) in adult Patient to follow up with PCP next month to discuss alternative treatment.   4. History of TIA (transient ischemic attack) Stable. Neurologic exam reassuring. Patient to continue preventative therapy.   Continue all other maintenance medications.  Follow up plan: Return in about 4 weeks (around 12/05/2023) for with PCP .   Continue healthy lifestyle choices, including diet (rich in fruits, vegetables, and lean proteins, and low in salt and simple carbohydrates) and exercise (at least 30 minutes of moderate physical activity daily).  Written and verbal instructions provided   The above assessment and management plan was discussed with the patient. The patient verbalized understanding of and has agreed to the management plan. Patient is aware to call the clinic if they develop any new symptoms or if symptoms persist or worsen. Patient is aware when to return to the clinic for a follow-up visit. Patient educated on when it is appropriate to go to the emergency department.   Jacqualyn Mates, DNP-FNP [facility] [address] [place], [place] [address] [phone]

## 2023-11-08 LAB — URIC ACID: Uric Acid: 5.2 mg/dL (ref 3.0–7.2)

## 2023-11-10 ENCOUNTER — Ambulatory Visit: Payer: Self-pay | Admitting: Family Medicine

## 2023-11-10 DIAGNOSIS — F909 Attention-deficit hyperactivity disorder, unspecified type: Secondary | ICD-10-CM | POA: Diagnosis not present

## 2023-11-10 DIAGNOSIS — N1831 Chronic kidney disease, stage 3a: Secondary | ICD-10-CM | POA: Diagnosis not present

## 2023-11-10 DIAGNOSIS — I129 Hypertensive chronic kidney disease with stage 1 through stage 4 chronic kidney disease, or unspecified chronic kidney disease: Secondary | ICD-10-CM | POA: Diagnosis not present

## 2023-11-10 NOTE — Progress Notes (Signed)
 Normal uric acid. Will await read of xray.

## 2023-11-13 ENCOUNTER — Ambulatory Visit: Admitting: Family Medicine

## 2023-11-14 ENCOUNTER — Ambulatory Visit: Admitting: *Deleted

## 2023-11-14 ENCOUNTER — Other Ambulatory Visit (HOSPITAL_COMMUNITY): Payer: Self-pay

## 2023-11-14 ENCOUNTER — Encounter: Payer: Self-pay | Admitting: *Deleted

## 2023-11-14 ENCOUNTER — Other Ambulatory Visit: Payer: Self-pay

## 2023-11-14 DIAGNOSIS — G8929 Other chronic pain: Secondary | ICD-10-CM

## 2023-11-14 DIAGNOSIS — M25562 Pain in left knee: Secondary | ICD-10-CM | POA: Diagnosis not present

## 2023-11-14 NOTE — Therapy (Signed)
 OUTPATIENT PHYSICAL THERAPY LOWER EXTREMITY TREATMENT   Patient Name: Candace Allen MRN: [id number] DOB:[birth year], 58 y.o., female Today's Date: 11/14/2023  END OF SESSION:  PT End of Session - 11/14/23 0902     Visit Number 11    Number of Visits 12    Date for PT Re-Evaluation 11/19/23    PT Start Time 0845    PT Stop Time 0945    PT Time Calculation (min) 60 min             Past Medical History:  Diagnosis Date   Attention deficit disorder (ADD) in adult    Chronic renal insufficiency, stage 3 (moderate) (HCC)    ?d/t dexilant   Diverticulosis 02/19/2018   By colonoscopy 2016   Endometrial cancer (HCC)    GERD (gastroesophageal reflux disease)    Hx of concussion    Approximately 2016   Hypothyroidism    question of-->no hx of thyroid  lab abnormalities in record   Hypothyroidism (acquired) 08/20/2019   IBS (irritable bowel syndrome)    TIA (transient ischemic attack) 10/20/2020   Past Surgical History:  Procedure Laterality Date   CHOLECYSTECTOMY N/A 11/08/2021   Procedure: LAPAROSCOPIC CHOLECYSTECTOMY;  Surgeon: Marijo Shove, DO;  Location: [facility];  Service: General;  Laterality: N/A;   COLONOSCOPY  2016   2016 no polyps. Rpt at [facility] 2022 per pt, normal, no record available   ENDOMETRIAL ABLATION     SHOULDER SURGERY Left    Approximately 2016   TRANSTHORACIC ECHOCARDIOGRAM     01/2021 NORMAL   TUBAL LIGATION     UPPER GASTROINTESTINAL ENDOSCOPY  11/10/2019   neg   Patient Active Problem List   Diagnosis Date Noted   AKI (acute kidney injury) (HCC) 10/21/2023   Encounter for screening fecal occult blood testing 09/02/2023   Routine general medical examination at a health care facility 09/02/2023   Environmental and seasonal allergies 09/02/2023   Stage 3a chronic kidney disease (HCC) 07/22/2023   Attention deficit disorder (ADD) in adult 07/17/2023   Decreased GFR 07/17/2023   Pure hypercholesterolemia 07/17/2023   Calculus of  gallbladder without cholecystitis without obstruction    Other fatigue 09/13/2021   Postmenopausal 11/09/2019   Weight loss 11/09/2019   History of endometrial cancer 02/19/2018   GERD (gastroesophageal reflux disease) 02/19/2018   IBS (irritable bowel syndrome) 02/19/2018   Diverticulosis 02/19/2018    REFERRING PROVIDER:  Chrystine Crate  REFERRING DIAG: Chronic pain of left knee.  THERAPY DIAG:  Chronic pain of left knee  Rationale for Evaluation and Treatment: Rehabilitation  ONSET DATE: 8 years.    SUBJECTIVE:   SUBJECTIVE STATEMENT: 1-2/10 LT knee pain/soreness this AM. Sciatica not good today     PERTINENT HISTORY: Please see above.  Left Sciatica.  PAIN:  Are you having pain? Yes: NPRS scale: 1-2/10. Pain location: Left knee. Pain description: Ache, sore, throbbing, sharp. Aggravating factors: "Everything." Relieving factors: "Nothing."  PRECAUTIONS: Other: No pain increase therex.  WEIGHT BEARING RESTRICTIONS: No  FALLS:  Has patient fallen in last 6 months? No  LIVING ENVIRONMENT: Lives with: lives with their spouse Lives in: House/apartment Has following equipment at home: None  OCCUPATION: Self-employed.    PLOF: Independent  PATIENT GOALS: To do things she wants to do without knee pain.     OBJECTIVE:  Note: Objective measures were completed at Evaluation unless otherwise noted.  DIAGNOSTIC FINDINGS: 09/19/23:  X-ray:  FINDINGS: Frontal view both knees with lateral view of the left  knee provided. The alignment and joint spaces are preserved. There is mild peripheral spurring of the medial tibiofemoral and patellofemoral compartments of the left knee. No fracture. No erosions or focal bone abnormality. No joint effusion. Unremarkable soft tissues. Frontal view of the right knee is unremarkable.   IMPRESSION: Mild osteoarthritis of the left knee.  EDEMA:  Circumferential: Equal. No edema noted today.  PALPATION: No notable  palpable pain today.   LOWER EXTREMITY ROM:  Full active left knee range of motion.  The patient's knees are remarkable for genu recurvatum.    LOWER EXTREMITY MMT:  Normal right knee and hip strength.  LOWER EXTREMITY SPECIAL TESTS:  The patient's knee is stable. (-) Anterior Drawer, varus/valgus testing.    GAIT: Normal gait pattern.                                                                                                                                TREATMENT DATE:   11/14/23                                  EXERCISE LOG   LT knee  Exercise Repetitions and Resistance Comments  Recumbent bike Level 4 x 15   minutes   Knee ext 20 # x 4 minutes   Ham curls 30# x  4 minutes   Split squat 3  x10   Step down 3  x   10   Rocker board  4 mins   LE elevation and vasopneumatic x 15 minutes   IFC  x 15 mins at100% scan to patient's left knee.     Bike L 4 x 15 mins  SAQ's  3# x10 hold 5 secs,   2 x 10 hold 10 secs Reviewed HEP LE elevation and vasopneumatic x 15 minutes with IFC 100% scan to patient's left knee.   10/14/23:                                     EXERCISE LOG  Exercise Repetitions and Resistance Comments  Recumbent bike Level 3 x 15 minutes   Knee ext 10# x 3 minutes   Ham curls 40# x 3 minutes   Leg Press 2 plates x 3 minutes       LE elevation and vasopneumatic x 15 minutes to patient's left knee.  10-09-23                                    EXERCISE LOG  LT knee  Exercise Repetitions and Resistance Comments  Bike X10 mins   Heel ups/Toe ups X fatigue HEP 3 x fatigue  QS and SLR 3x10 HEP  SL Hip ABD 3x10 HEP  SAQ 3#  2x10 hold 10 secs   Clam shell     Blank cell = exercise not performed today    PATIENT EDUCATION:  Education details: Discussed avoiding activities that reproduce patient's knee pain. Person educated: Patient Education method: Explanation Education comprehension: verbalized understanding  HOME EXERCISE  PROGRAM:   ASSESSMENT:  CLINICAL IMPRESSION: Patient arrived today with LT knee doing good , but  sciatica flared up. Pt doing well today and was able to perform all exs without a flare-up. act.'s and did well.. 2-3/10 end of session.DC next visit   OBJECTIVE IMPAIRMENTS: decreased activity tolerance and pain.   ACTIVITY LIMITATIONS: carrying, lifting, and bending  PARTICIPATION LIMITATIONS: meal prep, cleaning, laundry, and yard work  PERSONAL FACTORS: Time since onset of injury/illness/exacerbation are also affecting patient's functional outcome.   REHAB POTENTIAL: Good  CLINICAL DECISION MAKING: Evolving/moderate complexity  EVALUATION COMPLEXITY: Low   GOALS:  LONG TERM GOALS: Target date: 11/19/23  Ind with a HEP.  Goal status: MET  2.  Perform ADL's with left knee pain not > 2-3/10.  Goal status: Partially met    Intermittent  3.  Walk a community distance with pain not > 2-3/10.  Goal status: Partially met    intermittent   PLAN:  PT FREQUENCY: 2x/week  PT DURATION: 6 weeks  PLANNED INTERVENTIONS: 97110-Therapeutic exercises, 97530- Therapeutic activity, [id number]- Neuromuscular re-education, 97535- Self Care, 57846- Manual therapy, G0283- Electrical stimulation (unattended), 97016- Vasopneumatic device, Patient/Family education, Cryotherapy, and Moist heat  PLAN FOR NEXT SESSION: Pain-free left LE therex.  Modalities as needed.     Niley Helbig,CHRIS, PTA 11/14/2023, 11:38 AM

## 2023-11-15 ENCOUNTER — Ambulatory Visit
Admission: RE | Admit: 2023-11-15 | Discharge: 2023-11-15 | Disposition: A | Discharge status: Home / Self Care | Source: Ambulatory Visit | Attending: Family Medicine | Admitting: Family Medicine

## 2023-11-15 ENCOUNTER — Other Ambulatory Visit: Payer: Self-pay

## 2023-11-15 VITALS — BP 114/78 | HR 87 | Temp 98.3°F | Resp 20

## 2023-11-15 DIAGNOSIS — M25531 Pain in right wrist: Secondary | ICD-10-CM | POA: Diagnosis not present

## 2023-11-15 DIAGNOSIS — M654 Radial styloid tenosynovitis [de Quervain]: Secondary | ICD-10-CM | POA: Diagnosis not present

## 2023-11-15 MED ORDER — HYDROCODONE-ACETAMINOPHEN 5-325 MG PO TABS: 0.5000 | ORAL_TABLET | Freq: Four times a day (QID) | ORAL | 0 refills | Status: DC | PRN

## 2023-11-15 MED ORDER — METHYLPREDNISOLONE ACETATE 80 MG/ML IJ SUSP
80.0000 mg | Freq: Once | INTRAMUSCULAR | Status: AC
  Administered 2023-11-15: 80 mg via INTRAMUSCULAR

## 2023-11-15 NOTE — ED Triage Notes (Addendum)
 Pt reports intermittent right thumb pain since January. Denies any known injury.   Pt reports pain became more consistent last week. Seen at pcp, xray was ordered and completed, px voltaren  gel.   Pt reports last night had a grocery bag on right lower forearm. Raised arm for bag to slide down arm and reports "pain is now excruciating and constant" in right thumb.  Moderate swelling noted to right thumb. Radial pulse strong.

## 2023-11-15 NOTE — Discharge Instructions (Signed)
 Be aware, you have been prescribed pain medications that may cause drowsiness. While taking this medication, do not take any other medications containing acetaminophen  (Tylenol ). Do not combine with alcohol or recreational drugs. Please do not drive, operate heavy machinery, or take part in activities that require making important decisions while on this medication as your judgement may be clouded.  Wear your splint 24/7 for the next week other than to bathe.

## 2023-11-17 ENCOUNTER — Other Ambulatory Visit: Payer: Self-pay

## 2023-11-17 MED ORDER — LEVOCETIRIZINE DIHYDROCHLORIDE 5 MG PO TABS: 5.0000 mg | ORAL_TABLET | Freq: Every evening | ORAL | 2 refills | Status: AC

## 2023-11-17 NOTE — ED Provider Notes (Signed)
 [facility]   [id number] 11/15/23 Arrival Time: 0946  ASSESSMENT & PLAN:  1. Right wrist pain   2. De Quervain's tenosynovitis, right     I have personally viewed and independently interpreted the imaging studies dated 11/07/23: R wrist: no acute bony changes.  Meds ordered this encounter  Medications   methylPREDNISolone  acetate (DEPO-MEDROL ) injection 80 mg   HYDROcodone -acetaminophen  (NORCO/VICODIN) 5-325 MG tablet    Sig: Take 0.5-1 tablets by mouth every 6 (six) hours as needed for moderate pain (pain score 4-6) or severe pain (pain score 7-10).    Dispense:  10 tablet    Refill:  0    Orders Placed This Encounter  Procedures   Ambulatory referral to Orthopedic Surgery   Apply Thumb spica     Discharge Instructions      Be aware, you have been prescribed pain medications that may cause drowsiness. While taking this medication, do not take any other medications containing acetaminophen  (Tylenol ). Do not combine with alcohol or recreational drugs. Please do not drive, operate heavy machinery, or take part in activities that require making important decisions while on this medication as your judgement may be clouded.  Wear your splint 24/7 for the next week other than to bathe.    [place] Controlled Substances Registry consulted for this patient. I feel the risk/benefit ratio today is favorable for proceeding with this prescription for a controlled substance. Medication sedation precautions given.  Reviewed expectations re: course of current medical issues. Questions answered. Outlined signs and symptoms indicating need for more acute intervention. Patient verbalized understanding. After Visit Summary given.  SUBJECTIVE: History from: patient. Candace Allen is a 58 y.o. female who reports persistent right thumb/wrist pain since January. Denies any known injury.  Getting worse, especially with wrist movement and gripping items. Occas thumb tingling. Denies  trauma. PCP ordered R wrist x-ray last week.   Past Surgical History:  Procedure Laterality Date   CHOLECYSTECTOMY N/A 11/08/2021   Procedure: LAPAROSCOPIC CHOLECYSTECTOMY;  Surgeon: Marijo Shove, DO;  Location: [facility];  Service: General;  Laterality: N/A;   COLONOSCOPY  2016   2016 no polyps. Rpt at [facility] 2022 per pt, normal, no record available   ENDOMETRIAL ABLATION     SHOULDER SURGERY Left    Approximately 2016   TRANSTHORACIC ECHOCARDIOGRAM     01/2021 NORMAL   TUBAL LIGATION     UPPER GASTROINTESTINAL ENDOSCOPY  11/10/2019   neg      OBJECTIVE:  Vitals:   11/15/23 1029  BP: 114/78  Pulse: 87  Resp: 20  Temp: 98.3 F (36.8 C)  TempSrc: Oral  SpO2: 95%    General appearance: alert; no distress HEENT: ; AT Neck: supple with FROM Resp: unlabored respirations Extremities: RUE: warm with well perfused appearance; pain reported over radial side of wrist, more notable with thumb and wrist movement; no erythema or inflammation; no swelling; FROM; very tender over radial styloid CV: brisk extremity capillary refill of RUE; 2+ radial pulse of RUE. Skin: warm and dry; no visible rashes Neurologic: gait normal; normal sensation and strength of RUE Psychological: alert and cooperative; normal mood and affect    Allergies  Allergen Reactions   Bee Venom Anaphylaxis   Flagyl  [Metronidazole ] Anaphylaxis   Iodine Anaphylaxis   Shellfish Allergy Anaphylaxis   Tramadol  Hcl Other (See Comments)    Showed signs of stroke   Wasp Venom Anaphylaxis   Lactose Intolerance (Gi) Hives    Bloating   Gluten Meal Other (  See Comments)    Bloating    Melatonin Other (See Comments)    Nightmares     Past Medical History:  Diagnosis Date   Attention deficit disorder (ADD) in adult    Chronic renal insufficiency, stage 3 (moderate) (HCC)    ?d/t dexilant   Diverticulosis 02/19/2018   By colonoscopy 2016   Endometrial cancer (HCC)    GERD (gastroesophageal reflux  disease)    Hx of concussion    Approximately 2016   Hypothyroidism    question of-->no hx of thyroid  lab abnormalities in record   Hypothyroidism (acquired) 08/20/2019   IBS (irritable bowel syndrome)    TIA (transient ischemic attack) 10/20/2020   Social History   Socioeconomic History   Marital status: Married    Spouse name: Not on file   Number of children: 2   Years of education: Not on file   Highest education level: Associate degree: occupational, Scientist, product/process development, or vocational program  Occupational History   Occupation: Oceanographer  Tobacco Use   Smoking status: Never    Passive exposure: Never   Smokeless tobacco: Never  Vaping Use   Vaping status: Never Used  Substance and Sexual Activity   Alcohol use: Yes    Alcohol/week: 2.0 standard drinks of alcohol    Types: 2 Standard drinks or equivalent per week    Comment: social   Drug use: Never   Sexual activity: Yes    Birth control/protection: Surgical    Comment: tubal & ablation  Other Topics Concern   Not on file  Social History Narrative   Married, 2 children   Educ: 12+.   Grew up in [place].  [place] area approximately 2016.   Occup: publisher->[employer]   No tob   Social Drivers of Corporate investment banker Strain: Low Risk  (09/02/2023)   Overall Financial Resource Strain (CARDIA)    Difficulty of Paying Living Expenses: Not very hard  Food Insecurity: No Food Insecurity (10/21/2023)   Hunger Vital Sign    Worried About Running Out of Food in the Last Year: Never true    Ran Out of Food in the Last Year: Never true  Transportation Needs: No Transportation Needs (10/21/2023)   PRAPARE - Administrator, Civil Service (Medical): No    Lack of Transportation (Non-Medical): No  Physical Activity: Sufficiently Active (09/02/2023)   Exercise Vital Sign    Days of Exercise per Week: 5 days    Minutes of Exercise per Session: 90 min  Stress: Stress Concern Present (09/02/2023)    [employer] of Occupational Health - Occupational Stress Questionnaire    Feeling of Stress : To some extent  Social Connections: Moderately Integrated (09/02/2023)   Social Connection and Isolation Panel [NHANES]    Frequency of Communication with Friends and Family: More than three times a week    Frequency of Social Gatherings with Friends and Family: Once a week    Attends Religious Services: Never    Database administrator or Organizations: Yes    Attends Engineer, structural: More than 4 times per year    Marital Status: Married   Family History  Problem Relation Age of Onset   Peripheral Artery Disease Mother    Stroke Mother    Other Father        Hx unknown to patient   Healthy Daughter    Hypotension Maternal Grandmother    Aortic dissection Maternal Grandmother    Heart disease  Maternal Grandfather    Diabetes Paternal Grandmother        AODM   Diabetes Paternal Grandfather        AODM   Testicular cancer Brother    Esophageal cancer Brother    Early death Brother    Healthy Son    Ovarian cancer Paternal Great-grandmother    Breast cancer Neg Hx    Past Surgical History:  Procedure Laterality Date   CHOLECYSTECTOMY N/A 11/08/2021   Procedure: LAPAROSCOPIC CHOLECYSTECTOMY;  Surgeon: Marijo Shove, DO;  Location: [facility];  Service: General;  Laterality: N/A;   COLONOSCOPY  2016   2016 no polyps. Rpt at [facility] 2022 per pt, normal, no record available   ENDOMETRIAL ABLATION     SHOULDER SURGERY Left    Approximately 2016   TRANSTHORACIC ECHOCARDIOGRAM     01/2021 NORMAL   TUBAL LIGATION     UPPER GASTROINTESTINAL ENDOSCOPY  11/10/2019   neg       Afton Albright, MD 11/17/23 [phone]

## 2023-11-17 NOTE — Telephone Encounter (Signed)
 Prescription sent to pharmacy.

## 2023-11-18 ENCOUNTER — Ambulatory Visit: Attending: Family Medicine | Admitting: *Deleted

## 2023-11-18 DIAGNOSIS — G8929 Other chronic pain: Secondary | ICD-10-CM | POA: Diagnosis present

## 2023-11-18 DIAGNOSIS — M25562 Pain in left knee: Secondary | ICD-10-CM | POA: Diagnosis present

## 2023-11-18 NOTE — Progress Notes (Signed)
 Small accessory ossicle present, no acute findings. Recommend follow up with ortho, continue RICE therapy.

## 2023-11-18 NOTE — Therapy (Signed)
 OUTPATIENT PHYSICAL THERAPY LOWER EXTREMITY TREATMENT   Patient Name: Candace Allen MRN: [id number] DOB:Jun 11, 1966, 58 y.o., female Today's Date: 11/18/2023  END OF SESSION:  PT End of Session - 11/18/23 1617     Visit Number 12    Number of Visits 12    Date for PT Re-Evaluation 11/19/23    PT Start Time 1618    PT Stop Time 1720    PT Time Calculation (min) 62 min             Past Medical History:  Diagnosis Date   Attention deficit disorder (ADD) in adult    Chronic renal insufficiency, stage 3 (moderate) (HCC)    ?d/t dexilant   Diverticulosis 02/19/2018   By colonoscopy 2016   Endometrial cancer (HCC)    GERD (gastroesophageal reflux disease)    Hx of concussion    Approximately 2016   Hypothyroidism    question of-->no hx of thyroid  lab abnormalities in record   Hypothyroidism (acquired) 08/20/2019   IBS (irritable bowel syndrome)    TIA (transient ischemic attack) 10/20/2020   Past Surgical History:  Procedure Laterality Date   CHOLECYSTECTOMY N/A 11/08/2021   Procedure: LAPAROSCOPIC CHOLECYSTECTOMY;  Surgeon: Marijo Shove, DO;  Location: [facility];  Service: General;  Laterality: N/A;   COLONOSCOPY  2016   2016 no polyps. Rpt at [facility] 2022 per pt, normal, no record available   ENDOMETRIAL ABLATION     SHOULDER SURGERY Left    Approximately 2016   TRANSTHORACIC ECHOCARDIOGRAM     01/2021 NORMAL   TUBAL LIGATION     UPPER GASTROINTESTINAL ENDOSCOPY  11/10/2019   neg   Patient Active Problem List   Diagnosis Date Noted   AKI (acute kidney injury) (HCC) 10/21/2023   Encounter for screening fecal occult blood testing 09/02/2023   Routine general medical examination at a health care facility 09/02/2023   Environmental and seasonal allergies 09/02/2023   Stage 3a chronic kidney disease (HCC) 07/22/2023   Attention deficit disorder (ADD) in adult 07/17/2023   Decreased GFR 07/17/2023   Pure hypercholesterolemia 07/17/2023   Calculus of gallbladder  without cholecystitis without obstruction    Other fatigue 09/13/2021   Postmenopausal 11/09/2019   Weight loss 11/09/2019   History of endometrial cancer 02/19/2018   GERD (gastroesophageal reflux disease) 02/19/2018   IBS (irritable bowel syndrome) 02/19/2018   Diverticulosis 02/19/2018    REFERRING PROVIDER:  Chrystine Crate  REFERRING DIAG: Chronic pain of left knee.  THERAPY DIAG:  Chronic pain of left knee  Rationale for Evaluation and Treatment: Rehabilitation  ONSET DATE: 8 years.    SUBJECTIVE:   SUBJECTIVE STATEMENT: 1-2/10 LT knee pain/soreness this today.  DC today     PERTINENT HISTORY: Please see above.  Left Sciatica.  PAIN:  Are you having pain? Yes: NPRS scale: 1-2/10. Pain location: Left knee. Pain description: Ache, sore, throbbing, sharp. Aggravating factors: "Everything." Relieving factors: "Nothing."  PRECAUTIONS: Other: No pain increase therex.  WEIGHT BEARING RESTRICTIONS: No  FALLS:  Has patient fallen in last 6 months? No  LIVING ENVIRONMENT: Lives with: lives with their spouse Lives in: House/apartment Has following equipment at home: None  OCCUPATION: Self-employed.    PLOF: Independent  PATIENT GOALS: To do things she wants to do without knee pain.     OBJECTIVE:  Note: Objective measures were completed at Evaluation unless otherwise noted.  DIAGNOSTIC FINDINGS: 09/19/23:  X-ray:  FINDINGS: Frontal view both knees with lateral view of the left knee  provided. The alignment and joint spaces are preserved. There is mild peripheral spurring of the medial tibiofemoral and patellofemoral compartments of the left knee. No fracture. No erosions or focal bone abnormality. No joint effusion. Unremarkable soft tissues. Frontal view of the right knee is unremarkable.   IMPRESSION: Mild osteoarthritis of the left knee.  EDEMA:  Circumferential: Equal. No edema noted today.  PALPATION: No notable palpable pain today.    LOWER EXTREMITY ROM:  Full active left knee range of motion.  The patient's knees are remarkable for genu recurvatum.    LOWER EXTREMITY MMT:  Normal right knee and hip strength.  LOWER EXTREMITY SPECIAL TESTS:  The patient's knee is stable. (-) Anterior Drawer, varus/valgus testing.    GAIT: Normal gait pattern.                                                                                                                                TREATMENT DATE:   11/18/23                                  EXERCISE LOG   LT knee  Exercise Repetitions and Resistance Comments  Recumbent bike Level 4 x 15   minutes   Knee ext 20 # x 4 minutes   Ham curls 30# x  4 minutes   Split squat 3  x10   Step down  4 in step 3  x   10   Rocker board  4 mins   LE elevation and vasopneumatic x 15 minutes   IFC  x 15 mins at100% scan to patient's left knee.     Bike L 4 x 15 mins  SAQ's  3# x10 hold 5 secs,   2 x 10 hold 10 secs Reviewed HEP LE elevation and vasopneumatic x 15 minutes with IFC 100% scan to patient's left knee.   10/14/23:                                     EXERCISE LOG  Exercise Repetitions and Resistance Comments  Recumbent bike Level 3 x 15 minutes   Knee ext 10# x 3 minutes   Ham curls 40# x 3 minutes   Leg Press 2 plates x 3 minutes       LE elevation and vasopneumatic x 15 minutes to patient's left knee.  10-09-23                                    EXERCISE LOG  LT knee  Exercise Repetitions and Resistance Comments  Bike X10 mins   Heel ups/Toe ups X fatigue HEP 3 x fatigue  QS and SLR 3x10 HEP  SL Hip ABD 3x10 HEP  SAQ 3# 2x10 hold 10 secs   Clam shell     Blank cell = exercise not performed today    PATIENT EDUCATION:  Education details: Discussed avoiding activities that reproduce patient's knee pain. Person educated: Patient Education method: Explanation Education comprehension: verbalized understanding  HOME EXERCISE  PROGRAM:   ASSESSMENT:  CLINICAL IMPRESSION: Patient arrived today with LT knee doing good  and was able to continue with all exs without a flare-up. Pt has met all LTGs at this time.DC to HEP.   OBJECTIVE IMPAIRMENTS: decreased activity tolerance and pain.   ACTIVITY LIMITATIONS: carrying, lifting, and bending  PARTICIPATION LIMITATIONS: meal prep, cleaning, laundry, and yard work  PERSONAL FACTORS: Time since onset of injury/illness/exacerbation are also affecting patient's functional outcome.   REHAB POTENTIAL: Good  CLINICAL DECISION MAKING: Evolving/moderate complexity  EVALUATION COMPLEXITY: Low   GOALS:  LONG TERM GOALS: Target date: 11/19/23  Ind with a HEP.  Goal status: MET  2.  Perform ADL's with left knee pain not > 2-3/10.  Goal status: MET  3.  Walk a community distance with pain not > 2-3/10.  Goal status:  Met       PLAN:  PT FREQUENCY: 2x/week  PT DURATION: 6 weeks  PLANNED INTERVENTIONS: 97110-Therapeutic exercises, 97530- Therapeutic activity, [id number]- Neuromuscular re-education, 97535- Self Care, 29528- Manual therapy, G0283- Electrical stimulation (unattended), 97016- Vasopneumatic device, Patient/Family education, Cryotherapy, and Moist heat  PLAN FOR NEXT SESSION:  DC to HEP/ GYM   Earlene Bjelland,CHRIS, PTA 11/18/2023, 5:57 PM   PHYSICAL THERAPY DISCHARGE SUMMARY  Visits from Start of Care: 12.  Current functional level related to goals / functional outcomes: See above.   Remaining deficits: All goals met.   Education / Equipment: HEP.   Patient agrees to discharge. Patient goals were met. Patient is being discharged due to meeting the stated rehab goals.    Chad Applegate MPT

## 2023-11-20 ENCOUNTER — Other Ambulatory Visit: Payer: Self-pay

## 2023-11-25 ENCOUNTER — Encounter: Payer: Self-pay | Admitting: Neurology

## 2023-11-28 ENCOUNTER — Ambulatory Visit: Admitting: Family

## 2023-11-28 VITALS — BP 128/78 | HR 72 | Temp 97.7°F | Ht 70.0 in | Wt 158.6 lb

## 2023-11-28 DIAGNOSIS — K219 Gastro-esophageal reflux disease without esophagitis: Secondary | ICD-10-CM | POA: Diagnosis not present

## 2023-11-28 DIAGNOSIS — J302 Other seasonal allergic rhinitis: Secondary | ICD-10-CM

## 2023-11-28 DIAGNOSIS — F411 Generalized anxiety disorder: Secondary | ICD-10-CM | POA: Diagnosis not present

## 2023-11-28 DIAGNOSIS — F988 Other specified behavioral and emotional disorders with onset usually occurring in childhood and adolescence: Secondary | ICD-10-CM

## 2023-11-28 DIAGNOSIS — E785 Hyperlipidemia, unspecified: Secondary | ICD-10-CM | POA: Diagnosis not present

## 2023-11-28 DIAGNOSIS — J3089 Other allergic rhinitis: Secondary | ICD-10-CM

## 2023-11-28 DIAGNOSIS — N1831 Chronic kidney disease, stage 3a: Secondary | ICD-10-CM | POA: Diagnosis not present

## 2023-11-28 MED ORDER — FAMOTIDINE 40 MG PO TABS: 40.0000 mg | ORAL_TABLET | Freq: Two times a day (BID) | ORAL | 0 refills | Status: DC | PRN

## 2023-11-28 MED ORDER — LISDEXAMFETAMINE DIMESYLATE 40 MG PO CAPS: 40.0000 mg | ORAL_CAPSULE | ORAL | 0 refills | Status: DC

## 2023-11-28 MED ORDER — LISINOPRIL 5 MG PO TABS: 2.5000 mg | ORAL_TABLET | Freq: Every day | ORAL | 0 refills | Status: DC

## 2023-11-28 MED ORDER — MONTELUKAST SODIUM 10 MG PO TABS
10.0000 mg | ORAL_TABLET | Freq: Every day | ORAL | 3 refills | Status: AC
Start: 2023-11-28 — End: ?

## 2023-11-28 MED ORDER — LISDEXAMFETAMINE DIMESYLATE 40 MG PO CAPS
40.0000 mg | ORAL_CAPSULE | ORAL | 0 refills | Status: DC
Start: 2023-12-25 — End: 2024-02-10

## 2023-11-28 NOTE — Progress Notes (Signed)
 Subjective:    Patient ID: Candace Allen, female    DOB: [birth year], 58 y.o.   MRN: [id number]  Chief Complaint  Patient presents with   Follow-up   PT presents to the office today for chronic follow up and ADHD follow up.   Followed by GYN annually.   She is currently taking Vyvanse  40 mg daily. This helps her stay and complete tasks.   She has CKD and takes lisinopril  daily. Followed by Nephrologists.   She has hot flashes and takes Effexor  daily.   Has allergy rhinitis and takes xyzal  5 mg and Singulair  10 mg. Doing well.  Anxiety Presents for follow-up visit. Symptoms include excessive worry, nervous/anxious behavior and restlessness. Symptoms occur occasionally. The severity of symptoms is mild.    Gastroesophageal Reflux She complains of belching and heartburn. This is a chronic problem. The current episode started more than 1 year ago. The problem occurs occasionally. The symptoms are aggravated by certain foods. She has tried a PPI for the symptoms. The treatment provided mild relief.  Hyperlipidemia This is a chronic problem. The current episode started more than 1 year ago. The problem is uncontrolled. Current antihyperlipidemic treatment includes diet change and exercise. The current treatment provides mild improvement of lipids. Risk factors for coronary artery disease include dyslipidemia and post-menopausal.      Review of Systems  Gastrointestinal:  Positive for heartburn.  Psychiatric/Behavioral:  The patient is nervous/anxious.   All other systems reviewed and are negative.   Social History   Socioeconomic History   Marital status: Married    Spouse name: Not on file   Number of children: 2   Years of education: Not on file   Highest education level: Some college, no degree  Occupational History   Occupation: Oceanographer  Tobacco Use   Smoking status: Never    Passive exposure: Never   Smokeless tobacco: Never  Vaping Use   Vaping status: Never  Used  Substance and Sexual Activity   Alcohol use: Yes    Alcohol/week: 2.0 standard drinks of alcohol    Types: 2 Standard drinks or equivalent per week    Comment: social   Drug use: Never   Sexual activity: Yes    Birth control/protection: Surgical    Comment: tubal & ablation  Other Topics Concern   Not on file  Social History Narrative   Married, 2 children   Educ: 12+.   Grew up in [place]   Occup: publisher->[employer]   No tob   Social Drivers of Corporate investment banker Strain: Low Risk  (11/28/2023)   Overall Financial Resource Strain (CARDIA)    Difficulty of Paying Living Expenses: Not hard at all  Food Insecurity: No Food Insecurity (11/28/2023)   Hunger Vital Sign    Worried About Running Out of Food in the Last Year: Never true    Ran Out of Food in the Last Year: Never true  Transportation Needs: No Transportation Needs (11/28/2023)   PRAPARE - Administrator, Civil Service (Medical): No    Lack of Transportation (Non-Medical): No  Physical Activity: Sufficiently Active (11/28/2023)   Exercise Vital Sign    Days of Exercise per Week: 5 days    Minutes of Exercise per Session: 90 min  Stress: Stress Concern Present (11/28/2023)   [employer] of Occupational Health - Occupational Stress Questionnaire    Feeling of Stress: To some extent  Social Connections:  Moderately Integrated (11/28/2023)   Social Connection and Isolation Panel    Frequency of Communication with Friends and Family: More than three times a week    Frequency of Social Gatherings with Friends and Family: Twice a week    Attends Religious Services: Patient declined    Database administrator or Organizations: Yes    Attends Engineer, structural: Not on file    Marital Status: Married   Family History  Problem Relation Age of Onset   Peripheral Artery Disease Mother    Stroke Mother    Other Father        Hx  unknown to patient   Healthy Daughter    Hypotension Maternal Grandmother    Aortic dissection Maternal Grandmother    Heart disease Maternal Grandfather    Diabetes Paternal Grandmother        AODM   Diabetes Paternal Grandfather        AODM   Testicular cancer Brother    Esophageal cancer Brother    Early death Brother    Healthy Son    Ovarian cancer Paternal Great-grandmother    Breast cancer Neg Hx         Objective:   Physical Exam Vitals reviewed.  Constitutional:      General: She is not in acute distress.    Appearance: She is well-developed.  HENT:     Head: Normocephalic and atraumatic.     Right Ear: Tympanic membrane normal.     Left Ear: Tympanic membrane normal.   Eyes:     Pupils: Pupils are equal, round, and reactive to light.   Neck:     Thyroid : No thyromegaly.   Cardiovascular:     Rate and Rhythm: Normal rate and regular rhythm.     Heart sounds: Normal heart sounds. No murmur heard. Pulmonary:     Effort: Pulmonary effort is normal. No respiratory distress.     Breath sounds: Normal breath sounds. No wheezing.  Abdominal:     General: Bowel sounds are normal. There is no distension.     Palpations: Abdomen is soft.     Tenderness: There is no abdominal tenderness.   Musculoskeletal:        General: Tenderness (right wrist pain, in splint) present. Normal range of motion.     Cervical back: Normal range of motion and neck supple.   Skin:    General: Skin is warm and dry.   Neurological:     Mental Status: She is alert and oriented to person, place, and time.     Cranial Nerves: No cranial nerve deficit.     Deep Tendon Reflexes: Reflexes are normal and symmetric.   Psychiatric:        Behavior: Behavior normal.        Thought Content: Thought content normal.        Judgment: Judgment normal.       BP 128/78   Pulse 72   Temp 97.7 F (36.5 C) (Temporal)   Ht 5' 10 (1.778 m)   Wt 158 lb 9.6 oz (71.9 kg)   SpO2 100%   BMI  22.76 kg/m      Assessment & Plan:  Candace Allen comes in today with chief complaint of Follow-up   Diagnosis and orders addressed:  1. Attention deficit disorder (ADD) in adult (Primary) Meds as prescribed Behavior modification as needed Follow-up for recheck in 3 months - lisdexamfetamine (VYVANSE ) 40 MG capsule; Take 1 capsule (  40 mg total) by mouth every morning.  Dispense: 30 capsule; Refill: 0 - lisdexamfetamine (VYVANSE ) 40 MG capsule; Take 1 capsule (40 mg total) by mouth every morning.  Dispense: 30 capsule; Refill: 0 - lisdexamfetamine (VYVANSE ) 40 MG capsule; Take 1 capsule (40 mg total) by mouth every morning.  Dispense: 30 capsule; Refill: 0 - ToxASSURE Select 13 (MW), Urine  2. GAD (generalized anxiety disorder)  3. Seasonal allergies - montelukast  (SINGULAIR ) 10 MG tablet; Take 1 tablet (10 mg total) by mouth at bedtime.  Dispense: 90 tablet; Refill: 3  4. Gastroesophageal reflux disease, unspecified whether esophagitis present - famotidine  (PEPCID ) 40 MG tablet; Take 1 tablet (40 mg total) by mouth 2 (two) times daily as needed for heartburn/reflux.  Dispense: 180 tablet; Refill: 0  5. Stage 3a chronic kidney disease (HCC) - lisinopril  (ZESTRIL ) 5 MG tablet; Take 0.5 tablets (2.5 mg total) by mouth daily.  Dispense: 90 tablet; Refill: 0  6. Environmental and seasonal allergies  7. Hyperlipidemia, unspecified hyperlipidemia type   Labs pending Continue current medications  Keep follow up with specialists  Health Maintenance reviewed Diet and exercise encouraged  Return in about 3 months (around 02/28/2024), or if symptoms worsen or fail to improve.    Tommas Fragmin, FNP

## 2023-11-28 NOTE — Patient Instructions (Signed)

## 2023-12-02 ENCOUNTER — Telehealth: Payer: Self-pay

## 2023-12-02 NOTE — Telephone Encounter (Signed)
 The patient called in very frustrated as she still has not heard from an Orthopedic regarding her referral that was placed at the Urgent Care. I saw a referral in the system for Dr Phyllis Breeze and I relayed his contact information and transferred the call per her request.

## 2023-12-02 NOTE — Telephone Encounter (Signed)
 Left detailed message per dpr informing pt it looks like an urgent care dr is the one that placed the referral she is referring to and that we are not able to do anything with that referral since we are not the ones that placed it and that she may want to contact the urgent care to see if they are able to help.

## 2023-12-02 NOTE — Telephone Encounter (Signed)
 Copied from CRM [id number]. Topic: Referral - Status >> Dec 02, 2023  1:56 PM Felizardo Hotter wrote: Reason for CRM: Pt calling regarding referral# [id number], it is still pending review. Pt would like an update at [phone]

## 2023-12-03 LAB — TOXASSURE SELECT 13 (MW), URINE

## 2023-12-05 ENCOUNTER — Other Ambulatory Visit: Payer: Self-pay | Admitting: Family Medicine

## 2023-12-05 DIAGNOSIS — J302 Other seasonal allergic rhinitis: Secondary | ICD-10-CM

## 2023-12-08 ENCOUNTER — Ambulatory Visit: Payer: Self-pay | Admitting: Family

## 2023-12-10 ENCOUNTER — Ambulatory Visit: Admitting: Family Medicine

## 2023-12-23 ENCOUNTER — Ambulatory Visit: Admitting: Family Medicine

## 2023-12-29 ENCOUNTER — Encounter: Payer: Self-pay | Admitting: Orthopedic Surgery

## 2023-12-29 ENCOUNTER — Ambulatory Visit: Admitting: Orthopedic Surgery

## 2023-12-29 VITALS — BP 114/78 | Ht 70.0 in | Wt 158.0 lb

## 2023-12-29 DIAGNOSIS — M19041 Primary osteoarthritis, right hand: Secondary | ICD-10-CM

## 2023-12-29 DIAGNOSIS — M19049 Primary osteoarthritis, unspecified hand: Secondary | ICD-10-CM

## 2023-12-29 MED ORDER — METHYLPREDNISOLONE ACETATE 40 MG/ML IJ SUSP
40.0000 mg | Freq: Once | INTRAMUSCULAR | Status: AC
  Administered 2023-12-29: 40 mg via INTRA_ARTICULAR

## 2023-12-29 NOTE — Progress Notes (Signed)
  Intake history:  BP 114/78   Ht 5' 10 (1.778 m)   Wt 158 lb (71.7 kg)   BMI 22.67 kg/m  Body mass index is 22.67 kg/m.    WHAT ARE WE SEEING YOU FOR TODAY?   right wrist(s)  How long has this bothered you? (DOI?DOS?WS?)  7 week(s) ago was carrying bag of t shirts felt immediate sharp pain has had pain since   Anticoag.  No  Diabetes No  Heart disease No  Hypertension No  SMOKING HX No  Kidney disease Yes  Any ALLERGIES ______________________________________________   Treatment:  Have you taken:  Tylenol  Yes  Advil No states can't take due to kidneys   Had PT No  Had injection No  Other  _________has splint _______took Hydrocodone  _________

## 2023-12-29 NOTE — Progress Notes (Signed)
 Office Visit Note   Patient: Candace Allen           Date of Birth: 1965/11/28           MRN: [id number] Visit Date: 12/29/2023 Requested by: Lavell Bari LABOR, FNP [address] [place],  [place] [address] PCP: Lavell Bari LABOR, FNP   Assessment & Plan:   Encounter Diagnosis  Name Primary?   CMC arthritis- RIGHT Yes    Meds ordered this encounter  Medications   methylPREDNISolone  acetate (DEPO-MEDROL ) injection 40 mg    58 year old female cycling through CS headed to [place] in August presents with acute inflammation and pain in the [facility] joint of her right nondominant thumb.  Because she could not take anti-inflammatories and had also been splinted for 7 weeks and tried topical Voltaren  gel we injected the [facility] joint.  If we get a decent response and is needed we will repeat the injection.  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 CMC arthritis- RIGHT      Follow-Up Instructions: Return in about 5 weeks (around 02/02/2024) for FOLLOW UP, RIGHT//CMC JNT.    Objective: Vital Signs: BP 114/78   Ht 5' 10 (1.778 m)   Wt 158 lb (71.7 kg)   BMI 22.67 kg/m   Physical Exam Awake alert oriented x 3  Mood pleasant affect normal  General Appearance thin ectomorphic  Pulse and perfusion of the hand  normal  Ortho Exam  Normal sensation in the right thumb  Normal range of motion of the right wrist  No crepitance on range of motion of the [facility] joint  Tenderness and prominence of the base of the metacarpal of the thumb  Pinch test normal   Specialty Comments:  No specialty comments available.  Imaging: No results found.   PMFS History: Patient Active Problem List   Diagnosis Date Noted   Hyperlipidemia 11/28/2023   Environmental and seasonal allergies 09/02/2023   Stage 3a chronic kidney disease (HCC) 07/22/2023   Attention deficit disorder (ADD) in adult 07/17/2023   Pure hypercholesterolemia 07/17/2023   Calculus of gallbladder without cholecystitis without obstruction    Postmenopausal 11/09/2019   History of endometrial cancer 02/19/2018   GERD (gastroesophageal reflux disease) 02/19/2018   IBS (irritable bowel syndrome) 02/19/2018   Diverticulosis 02/19/2018   Past Medical History:  Diagnosis Date   Attention deficit disorder (ADD) in adult    Chronic renal insufficiency, stage 3 (moderate) (HCC)    ?d/t dexilant   Diverticulosis 02/19/2018   By colonoscopy 2016   Endometrial cancer (HCC)    GERD (gastroesophageal reflux disease)    Hx of concussion    Approximately 2016   Hypothyroidism    question of-->no hx  of thyroid  lab abnormalities in record   Hypothyroidism (acquired) 08/20/2019   IBS (irritable bowel syndrome)    TIA (transient ischemic attack) 10/20/2020    Family History  Problem Relation Age of Onset   Peripheral Artery Disease Mother    Stroke Mother    Other Father        Hx unknown to patient   Healthy Daughter    Hypotension Maternal Grandmother    Aortic dissection Maternal Grandmother    Heart disease Maternal Grandfather    Diabetes Paternal Grandmother        AODM   Diabetes Paternal Grandfather        AODM   Testicular cancer Brother    Esophageal cancer Brother    Early death Brother    Healthy Son    Ovarian cancer  Paternal Great-grandmother    Breast cancer Neg Hx     Past Surgical History:  Procedure Laterality Date   CHOLECYSTECTOMY N/A 11/08/2021   Procedure: LAPAROSCOPIC CHOLECYSTECTOMY;  Surgeon: Evonnie Dorothyann LABOR, DO;  Location: [facility];  Service: General;  Laterality: N/A;   COLONOSCOPY  2016   2016 no polyps. Rpt at [facility] 2022 per pt, normal, no record available   ENDOMETRIAL ABLATION     SHOULDER SURGERY Left    Approximately 2016   TRANSTHORACIC ECHOCARDIOGRAM     01/2021 NORMAL   TUBAL LIGATION     UPPER GASTROINTESTINAL ENDOSCOPY  11/10/2019   neg   Social History   Occupational History   Occupation: Oceanographer  Tobacco Use   Smoking status: Never    Passive exposure: Never   Smokeless tobacco: Never  Vaping Use   Vaping status: Never Used  Substance and Sexual Activity   Alcohol use: Yes    Alcohol/week: 2.0 standard drinks of alcohol    Types: 2 Standard drinks or equivalent per week    Comment: social   Drug use: Never   Sexual activity: Yes    Birth control/protection: Surgical    Comment: tubal & ablation   Procedures  Procedure note injection right thumb  Injection right CMC joint Medication Depo-Medrol  40 mg and lidocaine  1% 2 cc The patient gave verbal consent Timeout confirmed the site of injection right CMC joint  Alcohol and ethyl chloride was used to repair the skin and then a 25-gauge needle was used to inject the [facility] joint of the right thumb  There were no complications a sterile Band-Aid was applied

## 2024-01-19 ENCOUNTER — Encounter: Payer: Self-pay | Admitting: Neurology

## 2024-01-19 ENCOUNTER — Ambulatory Visit (INDEPENDENT_AMBULATORY_CARE_PROVIDER_SITE_OTHER): Admitting: Neurology

## 2024-01-19 VITALS — BP 122/77 | HR 91 | Resp 20 | Ht 70.0 in | Wt 162.0 lb

## 2024-01-19 DIAGNOSIS — G459 Transient cerebral ischemic attack, unspecified: Secondary | ICD-10-CM | POA: Diagnosis not present

## 2024-01-19 NOTE — Patient Instructions (Signed)
 Continue aspirin  81mg   Cardiac event monitor

## 2024-01-19 NOTE — Progress Notes (Signed)
 [facility] Neurology Division Clinic Note - Initial Visit   Date: 01/19/2024   [name] MRN: [id number] DOB: [birth year]   Dear Dr. Maryanne:  Thank you for your kind referral of Candace Allen for consultation of TIA. Although her history is well known to you, please allow us  to reiterate it for the purpose of our medical record. The patient was accompanied to the clinic by husband who also provides collateral information.     Candace Allen is a 58 y.o. left-handed female with CKD stage III, history of endometrial cancer, hypothyroidiam, IBS, and ADD presenting for evaluation of TIA.   IMPRESSION/PLAN: TIA manifesting with left hemisensory symptoms and left arm weakness (2022, 10/2023). MRI/A head and MRA neck is normal.  Echo is also normal.  To be complete, I recommend cardiac monitoring for arrhythmia.   Continue aspirin  81mg  daily Her LDL is mildly elevated (LDL 122) and she may benefit from statin medication.  I will notify PCP.   Further recommendations pending results.   ------------------------------------------------------------- History of present illness: Patient presented to the ER on 5/6 with acute onset of lightheadedness, left arm weakness as well as left face, arm, and leg tingling.  MRI brain and MRA head and neck was normal.  Due to concern of TIA, she was started on aspirin  81mg .  Symptoms lasted 4-5 hours.  She did not have any associated headache or vision changes.  During this time, she endorses being under a lot of stress, especially work-related.  She reports having similar spell in 2022 where she developed left face and arm tingling and left arm weakness.  She did not have weakness or tingling of the left leg.  Symptoms occurred 2-days after her husband returned home of cardiac surgery.   No history of diabetes.  LDL is mildly elevated 122.  She is very active and exercises regularly and does not eat high fat foods.    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 Lives at home with her husband.  Out-side paper records, electronic medical record, and images have been reviewed where available and summarized as:  MRI brain 10/21/2023:  Normal MRA head and neck 10/22/2023:  Negative MRA of the head and neck Echo 10/22/2023:  EF 60-65%  Lab Results  Component Value Date   CHOL 207 (H) 10/22/2023   HDL 66 10/22/2023   LDLCALC 122 (H) 10/22/2023   TRIG 95 10/22/2023   CHOLHDL 3.1 10/22/2023   Lab Results  Component Value Date   HGBA1C 5.4 10/21/2023    Lab Results  Component Value Date   HGBA1C 5.4 10/21/2023   Lab Results  Component Value Date   VITAMINB12 1,131 (H) 06/24/2022   Lab Results  Component Value Date   TSH 2.750 07/17/2023   Lab Results  Component Value Date   ESRSEDRATE 6 10/03/2020    Past Medical History:  Diagnosis Date   Attention deficit disorder (ADD) in adult    Chronic renal insufficiency, stage 3 (moderate) (HCC)    ?d/t dexilant   Diverticulosis 02/19/2018   By colonoscopy 2016   Endometrial cancer (HCC)    GERD (gastroesophageal reflux disease)    Hx of concussion    Approximately 2016   Hypothyroidism    question of-->no hx of thyroid  lab abnormalities in record   Hypothyroidism (acquired) 08/20/2019   IBS (irritable bowel syndrome)    TIA (transient ischemic attack) 10/20/2020    Past Surgical History:  Procedure Laterality Date   CHOLECYSTECTOMY  N/A 11/08/2021   Procedure: LAPAROSCOPIC CHOLECYSTECTOMY;  Surgeon: Evonnie Dorothyann LABOR, DO;  Location: [facility];  Service: General;  Laterality: N/A;   COLONOSCOPY  2016   2016 no polyps. Rpt at [facility] 2022 per pt, normal, no record available   ENDOMETRIAL ABLATION     SHOULDER SURGERY Left    Approximately 2016   TRANSTHORACIC ECHOCARDIOGRAM     01/2021 NORMAL   TUBAL LIGATION     UPPER GASTROINTESTINAL ENDOSCOPY  11/10/2019   neg     Medications:  Outpatient Encounter Medications as of 01/19/2024  Medication Sig    aspirin  EC 81 MG tablet Take 1 tablet (81 mg total) by mouth daily. Swallow whole.   diclofenac  Sodium (VOLTAREN ) 1 % GEL Apply 2 g topically 4 (four) times daily.   EPINEPHrine  0.3 mg/0.3 mL IJ SOAJ injection Inject 0.3 mg into the muscle as needed for anaphylaxis.   famotidine  (PEPCID ) 40 MG tablet Take 1 tablet (40 mg total) by mouth 2 (two) times daily as needed for heartburn/reflux.   levocetirizine (XYZAL ) 5 MG tablet Take 1 tablet (5 mg total) by mouth every evening.   lisdexamfetamine (VYVANSE ) 40 MG capsule Take 1 capsule (40 mg total) by mouth every morning.   [START ON 01/25/2024] lisdexamfetamine (VYVANSE ) 40 MG capsule Take 1 capsule (40 mg total) by mouth every morning.   lisinopril  (ZESTRIL ) 5 MG tablet Take 0.5 tablets (2.5 mg total) by mouth daily.   montelukast  (SINGULAIR ) 10 MG tablet Take 1 tablet (10 mg total) by mouth at bedtime.   UNABLE TO FIND Asparagus drops-daily in water; Liver Life-10 drops daily   venlafaxine  XR (EFFEXOR -XR) 37.5 MG 24 hr capsule TAKE 1 CAPSULE(37.5 MG) BY MOUTH DAILY WITH BREAKFAST   HYDROcodone -acetaminophen  (NORCO/VICODIN) 5-325 MG tablet Take 0.5-1 tablets by mouth every 6 (six) hours as needed for moderate pain (pain score 4-6) or severe pain (pain score 7-10). (Patient not taking: Reported on 01/19/2024)   lisdexamfetamine (VYVANSE ) 40 MG capsule Take 1 capsule (40 mg total) by mouth every morning.   Multiple Vitamins-Minerals (ADULT GUMMY) CHEW Chew 1 capsule by mouth daily. (Patient not taking: Reported on 01/19/2024)   VITAMIN D-VITAMIN K  PO Take 1 capsule by mouth daily. (Patient not taking: Reported on 01/19/2024)   No facility-administered encounter medications on file as of 01/19/2024.    Allergies:  Allergies  Allergen Reactions   Bee Venom Anaphylaxis   Flagyl  [Metronidazole ] Anaphylaxis   Iodine Anaphylaxis   Shellfish Allergy Anaphylaxis   Tramadol  Hcl Other (See Comments)    Showed signs of stroke   Wasp Venom Anaphylaxis   Lactose  Intolerance (Gi) Hives    Bloating   Gluten Meal Other (See Comments)    Bloating    Melatonin Other (See Comments)    Nightmares     Family History: Family History  Problem Relation Age of Onset   Peripheral Artery Disease Mother    Stroke Mother    Other Father        Hx unknown to patient   Healthy Daughter    Hypotension Maternal Grandmother    Aortic dissection Maternal Grandmother    Heart disease Maternal Grandfather    Diabetes Paternal Grandmother        AODM   Diabetes Paternal Grandfather        AODM   Testicular cancer Brother    Esophageal cancer Brother    Early death Brother    Healthy Son    Ovarian cancer Paternal Great-grandmother  Breast cancer Neg Hx     Social History: Social History   Tobacco Use   Smoking status: Never    Passive exposure: Never   Smokeless tobacco: Never  Vaping Use   Vaping status: Never Used  Substance Use Topics   Alcohol use: Yes    Alcohol/week: 2.0 standard drinks of alcohol    Types: 2 Standard drinks or equivalent per week    Comment: social   Drug use: Never   Social History   Social History Narrative   Married, 2 children--living 2 story home w/ 18 steps   Educ: 12+.   Grew up in [place].  [place] area approximately [place].   Occup: publisher->[employer]   No tob   Left hand    Vital Signs:  BP 122/77   Pulse 91   Resp 20   Ht 5' 10 (1.778 m)   Wt 162 lb (73.5 kg)   SpO2 99%   BMI 23.24 kg/m    Neurological Exam: MENTAL STATUS including orientation to time, place, person, recent and remote memory, attention span and concentration, language, and fund of knowledge is normal.  Speech is not dysarthric.  CRANIAL NERVES: II:  No visual field defects.     III-IV-VI: Pupils equal round and reactive to light.  Normal conjugate, extra-ocular eye movements in all directions of gaze.  No nystagmus.  No ptosis.   V:  Normal facial sensation.    VII:  Normal facial symmetry and  movements.   VIII:  Normal hearing and vestibular function.   IX-X:  Normal palatal movement.   XI:  Normal shoulder shrug and head rotation.   XII:  Normal tongue strength and range of motion, no deviation or fasciculation.  MOTOR:  Motor strength is 5/5 throughout.  No atrophy, fasciculations or abnormal movements.  No pronator drift.   MSRs:                                           Right        Left brachioradialis 2+  2+  biceps 2+  2+  triceps 2+  2+  patellar 2+  2+  ankle jerk 2+  2+  Hoffman no  no  plantar response down  down   SENSORY:  Normal and symmetric perception of light touch, pinprick, vibration, and temperature.   COORDINATION/GAIT: Normal finger-to- nose-finger.  Intact rapid alternating movements bilaterally.  Able to rise from a chair without using arms.  Gait narrow based and stable. Tandem and stressed gait intact.     Thank you for allowing me to participate in patient's care.  If I can answer any additional questions, I would be pleased to do so.    Sincerely,    Arizbeth Cawthorn K. Tobie, DO

## 2024-01-20 NOTE — Addendum Note (Signed)
 Addended by: Marena Witts M on: 01/20/2024 11:17 AM   Modules accepted: Orders

## 2024-01-22 ENCOUNTER — Encounter (INDEPENDENT_AMBULATORY_CARE_PROVIDER_SITE_OTHER): Payer: Self-pay

## 2024-01-22 DIAGNOSIS — R051 Acute cough: Secondary | ICD-10-CM | POA: Diagnosis not present

## 2024-01-23 ENCOUNTER — Ambulatory Visit: Payer: Self-pay | Admitting: *Deleted

## 2024-01-23 ENCOUNTER — Encounter: Payer: Self-pay | Admitting: Family Medicine

## 2024-01-23 ENCOUNTER — Telehealth: Admitting: Family Medicine

## 2024-01-23 DIAGNOSIS — R052 Subacute cough: Secondary | ICD-10-CM

## 2024-01-23 MED ORDER — PROMETHAZINE-DM 6.25-15 MG/5ML PO SYRP: 2.5000 mL | ORAL_SOLUTION | Freq: Four times a day (QID) | ORAL | 0 refills | Status: DC | PRN

## 2024-01-23 NOTE — Telephone Encounter (Signed)
 FYI Only or Action Required?: Action required by provider: request for appointment and clinical question for provider.  Patient was last seen in primary care on 11/28/2023 by Lavell Bari LABOR, FNP.  Called Nurse Triage reporting Cough.  Symptoms began several months ago.  Interventions attempted: Prescription medications: cough medication , Delsym not effective.  Symptoms are: gradually worsening.  Triage Disposition: See Physician Within 24 Hours  Patient/caregiver understands and will follow disposition?: Yes               Copied from CRM #[id number]. Topic: Clinical - Red Word Triage >> Jan 23, 2024 10:59 AM Ivette P wrote: Red Word that prompted transfer to Nurse Triage: Previously I was given a cough medication due to this horrible cough. It subsided briefly but never went away. I'm not sleeping, there's times I'm running to vomit. I've tried the 12 hour Delsum and it is remarkable to get 3-4 hours before the coughing returns. 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  I thought all this was due to my allergies and yes my allergies are still present unfortunately.  I'm wondering if it's a side effect from my Lisopril?  I had hoped to wait to discuss this at my next appointment 9/8 but this has become unbearable. Reason for Disposition  [1] Continuous (nonstop) coughing interferes with work or school AND [2] no improvement using cough treatment per Care Advice  Answer Assessment - Initial Assessment Questions No available appt with PCP today or Monday. Scheduled appt with DOD for 01/26/24. Recommended if sx worsen go to UC/ED.        1. ONSET: When did the cough begin?      January and worsening after starting lisinopril   2. SEVERITY: How bad is the cough today?      Coughing spells causing vomiting 3. SPUTUM: Describe the color of your sputum (e.g., none, dry cough; clear, white, yellow, green)     Dry  4.  HEMOPTYSIS: Are you coughing up any blood? If so ask: How much? (e.g., flecks, streaks, tablespoons, etc.)     na 5. DIFFICULTY BREATHING: Are you having difficulty breathing? If Yes, ask: How bad is it? (e.g., mild, moderate, severe)      No comes and goes with coughing spells 6. FEVER: Do you have a fever? If Yes, ask: What is your temperature, how was it measured, and when did it start?     na 7. CARDIAC HISTORY: Do you have any history of heart disease? (e.g., heart attack, congestive heart failure)      na 8. LUNG HISTORY: Do you have any history of lung disease?  (e.g., pulmonary embolus, asthma, emphysema)     na 9. PE RISK FACTORS: Do you have a history of blood clots? (or: recent major surgery, recent prolonged travel, bedridden)     na 10. OTHER SYMPTOMS: Do you have any other symptoms? (e.g., runny nose, wheezing, chest pain)       Post nasal drip after coughing spells. Vomiting after coughing at times.  11. PREGNANCY: Is there any chance you are pregnant? When was your last menstrual period?       na 12. TRAVEL: Have you traveled out of the country in the last month? (e.g., travel history, exposures)       na  Protocols used: Cough - Chronic-A-AH

## 2024-01-23 NOTE — Telephone Encounter (Signed)
 Called and left message for patient letting her know that we have a few openings with DOD today for video visits if she would rather do that. If not, then we will wait and see her on moment at her current scheduled appt.

## 2024-01-23 NOTE — Patient Instructions (Signed)
 Farxiga or Jardiance to help with kidneys.

## 2024-01-23 NOTE — Progress Notes (Signed)
 MyChart Video visit  Subjective: RR:rnlhy PCP: Lavell Bari LABOR, FNP YEP:Opdj Candace Allen is a 58 y.o. female. Patient provides verbal consent for consult held via video.  Due to COVID-19 pandemic this visit was conducted virtually. This visit type was conducted due to national recommendations for restrictions regarding the COVID-19 Pandemic (e.g. social distancing, sheltering in place) in an effort to limit this patient's exposure and mitigate transmission in our community. All issues noted in this document were discussed and addressed.  A physical exam was not performed with this format.   Location of patient: home Location of provider: [facility] Others present for call: none  1. Cough Patient reports onset in January with allergies.  She was put on Singulair  and Xyzal  and that helped with allergies but she has random coughing attacks.  She reports no shortness of breath or wheezing.  No hemoptysis.  Her coughing sometimes causes her to lose her breath.  Has been utilizing Delsym with no improvement   ROS: Per HPI  Allergies  Allergen Reactions   Bee Venom Anaphylaxis   Flagyl  [Metronidazole ] Anaphylaxis   Iodine Anaphylaxis   Shellfish Allergy Anaphylaxis   Tramadol  Hcl Other (See Comments)    Showed signs of stroke   Wasp Venom Anaphylaxis   Lactose Intolerance (Gi) Hives    Bloating   Gluten Meal Other (See Comments)    Bloating    Melatonin Other (See Comments)    Nightmares    Past Medical History:  Diagnosis Date   Attention deficit disorder (ADD) in adult    Chronic renal insufficiency, stage 3 (moderate) (HCC)    ?d/t dexilant   Diverticulosis 02/19/2018   By colonoscopy 2016   Endometrial cancer (HCC)    GERD (gastroesophageal reflux disease)    Hx of concussion    Approximately 2016   Hypothyroidism    question of-->no hx of thyroid  lab abnormalities in record   Hypothyroidism (acquired) 08/20/2019   IBS (irritable bowel syndrome)    TIA (transient ischemic  attack) 10/20/2020    Current Outpatient Medications:    aspirin  EC 81 MG tablet, Take 1 tablet (81 mg total) by mouth daily. Swallow whole., Disp: 30 tablet, Rfl: 12   diclofenac  Sodium (VOLTAREN ) 1 % GEL, Apply 2 g topically 4 (four) times daily., Disp: 2 g, Rfl: 0   EPINEPHrine  0.3 mg/0.3 mL IJ SOAJ injection, Inject 0.3 mg into the muscle as needed for anaphylaxis., Disp: 1 each, Rfl: 0   famotidine  (PEPCID ) 40 MG tablet, Take 1 tablet (40 mg total) by mouth 2 (two) times daily as needed for heartburn/reflux., Disp: 180 tablet, Rfl: 0   HYDROcodone -acetaminophen  (NORCO/VICODIN) 5-325 MG tablet, Take 0.5-1 tablets by mouth every 6 (six) hours as needed for moderate pain (pain score 4-6) or severe pain (pain score 7-10). (Patient not taking: Reported on 01/19/2024), Disp: 10 tablet, Rfl: 0   levocetirizine (XYZAL ) 5 MG tablet, Take 1 tablet (5 mg total) by mouth every evening., Disp: 90 tablet, Rfl: 2   lisdexamfetamine (VYVANSE ) 40 MG capsule, Take 1 capsule (40 mg total) by mouth every morning., Disp: 30 capsule, Rfl: 0   lisdexamfetamine (VYVANSE ) 40 MG capsule, Take 1 capsule (40 mg total) by mouth every morning., Disp: 30 capsule, Rfl: 0   [START ON 01/25/2024] lisdexamfetamine (VYVANSE ) 40 MG capsule, Take 1 capsule (40 mg total) by mouth every morning., Disp: 30 capsule, Rfl: 0   lisinopril  (ZESTRIL ) 5 MG tablet, Take 0.5 tablets (2.5 mg total) by mouth daily., Disp: 90 tablet,  Rfl: 0   montelukast  (SINGULAIR ) 10 MG tablet, Take 1 tablet (10 mg total) by mouth at bedtime., Disp: 90 tablet, Rfl: 3   Multiple Vitamins-Minerals (ADULT GUMMY) CHEW, Chew 1 capsule by mouth daily. (Patient not taking: Reported on 01/19/2024), Disp: , Rfl:    UNABLE TO FIND, Asparagus drops-daily in water; Liver Life-10 drops daily, Disp: , Rfl:    venlafaxine  XR (EFFEXOR -XR) 37.5 MG 24 hr capsule, TAKE 1 CAPSULE(37.5 MG) BY MOUTH DAILY WITH BREAKFAST, Disp: 90 capsule, Rfl: 0   VITAMIN D-VITAMIN K  PO, Take 1 capsule  by mouth daily. (Patient not taking: Reported on 01/19/2024), Disp: , Rfl:   Gen: Well-appearing female in no acute distress Pulm: Normal work of breathing on room air.  No wheezes.  No dyspnea with speech.  No coughing observed  Assessment/ Plan: 58 y.o. female   Subacute cough - Plan: promethazine -dextromethorphan (PROMETHAZINE -DM) 6.25-15 MG/5ML syrup  Medications Discontinued During This Encounter  Medication Reason   lisinopril  (ZESTRIL ) 5 MG tablet    I question if maybe this is induced by ACE inhibitor.  I advised her to discontinue the ACE inhibitor and discussed with her PCP may be using Farxiga or Jardiance to help prevent progression of renal disease.  I have given her some cough syrup just to get her through while the medication gets out of her system though I do not anticipate that she will need to use it for very long.  Start time: 1:50pm End time: 1:56pm  Total time spent on patient care (including video visit/ documentation): 8 minutes  Candace Allers CHRISTELLA Fielding, DO [facility] [phone]

## 2024-01-26 ENCOUNTER — Ambulatory Visit

## 2024-01-30 MED ORDER — PREDNISONE 10 MG (21) PO TBPK: ORAL_TABLET | ORAL | 0 refills | Status: DC

## 2024-01-30 NOTE — Telephone Encounter (Signed)
 Hello, I have sent in prednisone  to the pharmacy. Let me know if symptoms worsen or do not improve.    Bari Learn, FNP  Approximately 5 minutes was spent documenting and reviewing patient's chart.

## 2024-01-30 NOTE — Addendum Note (Signed)
 Addended by: LAVELL LYE A on: 01/30/2024 03:37 PM   Modules accepted: Orders

## 2024-02-02 ENCOUNTER — Ambulatory Visit: Admitting: Orthopedic Surgery

## 2024-02-10 ENCOUNTER — Encounter: Payer: Self-pay | Admitting: Family

## 2024-02-10 ENCOUNTER — Ambulatory Visit (INDEPENDENT_AMBULATORY_CARE_PROVIDER_SITE_OTHER): Admitting: Family

## 2024-02-10 VITALS — BP 132/88 | HR 79 | Temp 97.2°F

## 2024-02-10 DIAGNOSIS — R052 Subacute cough: Secondary | ICD-10-CM | POA: Diagnosis not present

## 2024-02-10 DIAGNOSIS — J3089 Other allergic rhinitis: Secondary | ICD-10-CM

## 2024-02-10 DIAGNOSIS — K219 Gastro-esophageal reflux disease without esophagitis: Secondary | ICD-10-CM | POA: Diagnosis not present

## 2024-02-10 MED ORDER — OMEPRAZOLE 20 MG PO CPDR: 20.0000 mg | DELAYED_RELEASE_CAPSULE | Freq: Every day | ORAL | 1 refills | Status: DC

## 2024-02-10 NOTE — Patient Instructions (Addendum)
 Cough, Adult Coughing is a reflex that clears your throat and airways (respiratory system). It helps heal and protect your lungs. It is normal to cough from time to time. A cough that happens with other symptoms or that lasts a long time may be a sign of a condition that needs treatment. A short-term (acute) cough may only last 2-3 weeks. A long-term (chronic) cough may last 8 or more weeks. Coughing is often caused by: Diseases, such as: An infection of the respiratory system. Asthma or other heart or lung diseases. Gastroesophageal reflux. This is when acid comes back up from the stomach. Breathing in things that irritate your lungs. Allergies. Postnasal drip. This is when mucus runs down the back of your throat. Smoking. Some medicines. Follow these instructions at home: Medicines Take over-the-counter and prescription medicines only as told by your health care provider. Talk with your provider before you take cough medicine (cough suppressants). Eating and drinking Do not drink alcohol. Avoid caffeine. Drink enough fluid to keep your pee (urine) pale yellow. Lifestyle Avoid cigarette smoke. Do not use any products that contain nicotine or tobacco. These products include cigarettes, chewing tobacco, and vaping devices, such as e-cigarettes. If you need help quitting, ask your provider. Avoid things that make you cough. These may include perfumes, candles, cleaning products, or campfire smoke. General instructions  Watch for any changes to your cough. Tell your provider about them. Always cover your mouth when you cough. If the air is dry in your bedroom or home, use a cool mist vaporizer or humidifier. If your cough is worse at night, try to sleep in a semi-upright position. Rest as needed. Contact a health care provider if: You have new symptoms, or your symptoms get worse. You cough up pus. You have a fever that does not go away or a cough that does not get better after 2-3  weeks. You cannot control your cough with medicine, and you are losing sleep. You have pain that gets worse or is not helped with medicine. You lose weight for no clear reason. You have night sweats. Get help right away if: You cough up blood. You have trouble breathing. Your heart is beating very fast. These symptoms may be an emergency. Get help right away. Call 911. Do not wait to see if the symptoms will go away. Do not drive yourself to the hospital. This information is not intended to replace advice given to you by your health care provider. Make sure you discuss any questions you have with your health care provider.  GERD in Adults: What to Know  Gastroesophageal reflux (GER) is when acid from your stomach flows up into your esophagus. Your esophagus is the part of your body that moves food from your mouth to your stomach. Normally, food goes down and stays in your stomach to be digested. But with GER, food and stomach acid may go back up. You may have a disease called gastroesophageal reflux disease (GERD) if the reflux: Happens often. Causes very bad symptoms. Makes your esophagus sore and swollen. Over time, GERD can make small holes called ulcers in the lining of your esophagus. What are the causes? GERD is caused by a problem with the muscle between your esophagus and stomach. This muscle is called the lower esophageal sphincter (LES). When it's weak or not normal, it doesn't close like it should. This means food and stomach acid can go back up into your esophagus. The muscle can be weak if: You smoke or  use products with tobacco in them. You're pregnant. You have a type of hernia called a hiatal hernia. You eat certain foods and drinks. These include: Alcohol. Coffee. Chocolate. Onions. Peppermint. What increases the risk? Being overweight. Having a disease that affects your connective tissue. Taking NSAIDs, such as ibuprofen. What are the signs or  symptoms? Heartburn. Trouble swallowing. Pain when you swallow. The feeling of having a lump in your throat. A bitter taste in your mouth. Bad breath. Having an upset or bloated stomach. Burping. Chest pain. Other conditions can also cause chest pain. Make sure you see your health care provider if you have chest pain. Wheezing. This is when you make high-pitched whistling sounds when you breathe, most often when you breathe out. A long-term cough or a cough at night. How is this diagnosed? GERD may be diagnosed based on your medical history and a physical exam. You may also have tests. These may include: An endoscopy. This test looks at your stomach and esophagus with a small camera. A barium swallow test. This shows the shape and size of your esophagus and how well it's working. Tests of your esophagus to check for: Acid levels. Pressure. How is this treated? Treatment may depend on how bad your symptoms are. It may include: Changes to your diet and daily life. Medicines. Surgery. Follow these instructions at home: Eating and drinking Follow an eating plan as told by your provider. You may need to avoid certain foods and drinks. These may include: Coffee and tea, with or without caffeine. Alcohol. Energy drinks and sports drinks. Fizzy drinks or sodas. Chocolate and cocoa. Peppermint and mint flavorings. Garlic and onions. Horseradish. Spicy and acidic foods. These include: Peppers. Chili powder and curry powder. Vinegar. Hot sauces and BBQ sauce. Citrus fruits and juices. These include: Oranges. Lemons. Limes. Tomato-based foods. These include: Red sauce and pizza with red sauce. Chili. Salsa. Fried and fatty foods. These include: Donuts. [place] fries. Potato chips. High-fat dressings. High-fat meats. These include: Hot dogs and sausage. Rib eye steak. Ham and bacon. High-fat dairy items. These include: Whole milk. Butter. Cream cheese. Eat small meals  often. Avoid eating big meals. Avoid drinking lots of liquid with your meals. Try not to eat meals during the 2-3 hours before bedtime. Try not to lie down right after you eat. Do not exercise right after you eat. Lifestyle  If you're overweight, lose an amount of weight that's healthy for you. Ask your provider about a safe weight loss goal. Do not smoke, vape, or use nicotine or tobacco. Wear loose clothes. Do not wear things that are tight around your waist. When you sleep, try: Raising the head of your bed about 6 inches (15 cm). You can use a wedge to do this. Lying down on your left side. Try to lower your stress. If you need help doing this, ask your provider. General instructions Take your medicines only as told. Do not take aspirin  or ibuprofen unless you're told to. Watch for any changes in your symptoms. Do not bend over if it makes your symptoms worse. Contact a health care provider if: You have new symptoms. You have trouble: Drinking. Swallowing. Eating. It hurts to swallow. You have wheezing. You have a cough that won't go away. Your voice is hoarse. Your symptoms don't get better with treatment. Get help right away if: You have pain all of a sudden in your: Arm. Neck. Jaw. Teeth. Back. You feel sweaty, dizzy, or light-headed all of a sudden.  You faint. You have chest pain or shortness of breath. You vomit and the vomit is: Green, yellow, or black. Looks like blood or coffee grounds. Your poop is red, bloody, or black. These symptoms may be an emergency. Call 911 right away. Do not wait to see if the symptoms will go away. Do not drive yourself to the hospital. This information is not intended to replace advice given to you by your health care provider. Make sure you discuss any questions you have with your health care provider. Document Revised: 04/15/2023 Document Reviewed: 10/30/2022 Elsevier Patient Education  2024 Elsevier Inc. Document Revised:  02/01/2022 Document Reviewed: 02/01/2022 Elsevier Patient Education  2024 [employer].

## 2024-02-10 NOTE — Progress Notes (Signed)
 Subjective:    Patient ID: Candace Allen, female    DOB: 10-06-1965, 59 y.o.   MRN: [id number]  Chief Complaint  Patient presents with   Cough    Xray and listen to chest . BP been running high    PT presents to the office today with a cough that started in January. She reports she was having a dry nonproductive cough. She states she went to [place]  and after noticed the cough.   She sent me a MyChart message on 01/22/24 concern about her coughing and if it was related to her lisinopril  2.5 mg. She was told to stop this medication. She messaged back on 01/30/24 with continued cough and no improvements. We sent in prednisone  dose pack. Reports her cough was still on going, however, 02/06/24 reports her cough resolved. She is taking singulari and xyzal  daily.   Does report GERD symptoms recently.  Cough This is a recurrent problem. The current episode started more than 1 month ago. The problem has been gradually improving. The cough is Non-productive. Associated symptoms include heartburn. Pertinent negatives include no chills, ear congestion, ear pain, fever, headaches, hemoptysis, myalgias, nasal congestion, postnasal drip, sore throat, shortness of breath, weight loss or wheezing. She has tried rest for the symptoms. The treatment provided mild relief.  Gastroesophageal Reflux She complains of belching, coughing and heartburn. She reports no sore throat or no wheezing. This is a chronic problem. The current episode started more than 1 year ago. The problem occurs occasionally. The symptoms are aggravated by certain foods. Pertinent negatives include no weight loss. Risk factors include obesity. She has tried an antacid, a diet change and a histamine-2 antagonist for the symptoms. The treatment provided mild relief.      Review of Systems  Constitutional:  Negative for chills, fever and weight loss.  HENT:  Negative for ear pain, postnasal drip and sore throat.   Respiratory:  Positive  for cough. Negative for hemoptysis, shortness of breath and wheezing.   Gastrointestinal:  Positive for heartburn.  Musculoskeletal:  Negative for myalgias.  Neurological:  Negative for headaches.  All other systems reviewed and are negative.   Social History   Socioeconomic History   Marital status: Married    Spouse name: Not on file   Number of children: 2   Years of education: Not on file   Highest education level: Some college, no degree  Occupational History   Occupation: Oceanographer  Tobacco Use   Smoking status: Never    Passive exposure: Never   Smokeless tobacco: Never  Vaping Use   Vaping status: Never Used  Substance and Sexual Activity   Alcohol use: Yes    Alcohol/week: 2.0 standard drinks of alcohol    Types: 2 Standard drinks or equivalent per week    Comment: social   Drug use: Never   Sexual activity: Yes    Birth control/protection: Surgical    Comment: tubal & ablation  Other Topics Concern   Not on file  Social History Narrative   Married, 2 children--living 2 story home w/ 18 steps   Educ: 12+.   Grew up in [place].  [place] area approximately 2016.   Occup: publisher->[employer]   No tob   Left hand   Social Drivers of Corporate investment banker Strain: Low Risk  (11/28/2023)   Overall Financial Resource Strain (CARDIA)    Difficulty of Paying Living Expenses: Not hard at all  Food Insecurity: No Food Insecurity (  11/28/2023)   Hunger Vital Sign    Worried About Running Out of Food in the Last Year: Never true    Ran Out of Food in the Last Year: Never true  Transportation Needs: No Transportation Needs (11/28/2023)   PRAPARE - Administrator, Civil Service (Medical): No    Lack of Transportation (Non-Medical): No  Physical Activity: Sufficiently Active (11/28/2023)   Exercise Vital Sign    Days of Exercise per Week: 5 days    Minutes of Exercise per Session: 90 min  Stress: Stress Concern Present (11/28/2023)    [employer] of Occupational Health - Occupational Stress Questionnaire    Feeling of Stress: To some extent  Social Connections: Moderately Integrated (11/28/2023)   Social Connection and Isolation Panel    Frequency of Communication with Friends and Family: More than three times a week    Frequency of Social Gatherings with Friends and Family: Twice a week    Attends Religious Services: Patient declined    Database administrator or Organizations: Yes    Attends Engineer, structural: Not on file    Marital Status: Married   Family History  Problem Relation Age of Onset   Peripheral Artery Disease Mother    Stroke Mother    Other Father        Hx unknown to patient   Healthy Daughter    Hypotension Maternal Grandmother    Aortic dissection Maternal Grandmother    Heart disease Maternal Grandfather    Diabetes Paternal Grandmother        AODM   Diabetes Paternal Grandfather        AODM   Testicular cancer Brother    Esophageal cancer Brother    Early death Brother    Healthy Son    Ovarian cancer Paternal Great-grandmother    Breast cancer Neg Hx         Objective:   Physical Exam Vitals reviewed.  Constitutional:      General: She is not in acute distress.    Appearance: She is well-developed.  HENT:     Head: Normocephalic and atraumatic.  Eyes:     Pupils: Pupils are equal, round, and reactive to light.  Neck:     Thyroid : No thyromegaly.  Cardiovascular:     Rate and Rhythm: Normal rate and regular rhythm.     Heart sounds: Normal heart sounds. No murmur heard. Pulmonary:     Effort: Pulmonary effort is normal. No respiratory distress.     Breath sounds: Normal breath sounds. No wheezing.  Abdominal:     General: Bowel sounds are normal. There is no distension.     Palpations: Abdomen is soft.     Tenderness: There is no abdominal tenderness.  Musculoskeletal:        General: No tenderness. Normal range of motion.     Cervical back: Normal  range of motion and neck supple.  Skin:    General: Skin is warm and dry.  Neurological:     Mental Status: She is alert and oriented to person, place, and time.     Cranial Nerves: No cranial nerve deficit.     Deep Tendon Reflexes: Reflexes are normal and symmetric.  Psychiatric:        Behavior: Behavior normal.        Thought Content: Thought content normal.        Judgment: Judgment normal.       BP 132/88  Pulse 79   Temp (!) 97.2 F (36.2 C) (Temporal)   SpO2 97%      Assessment & Plan:  Candace Allen comes in today with chief complaint of Cough (Xray and listen to chest . BP been running high )   Diagnosis and orders addressed:  1. Subacute cough (Primary) Resolved   2. Environmental and seasonal allergies Continue Singulair  10 mg and Xyzal  5 mg  Avoid allergens  3. Gastroesophageal reflux disease without esophagitis Start Prilosec 20 mg daily -Diet discussed- Avoid fried, spicy, citrus foods, caffeine and alcohol -Do not eat 2-3 hours before bedtime -Encouraged small frequent meals -Avoid NSAID's - omeprazole  (PRILOSEC) 20 MG capsule; Take 1 capsule (20 mg total) by mouth daily.  Dispense: 90 capsule; Refill: 1  Will add prilosec 20 mg  -Diet discussed- Avoid fried, spicy, citrus foods, caffeine and alcohol -Do not eat 2-3 hours before bedtime -Encouraged small frequent meals -Avoid NSAID's BP is at goal today. Continue to hold lisinopril  2.5 mg  Continue current medications  Diet and exercise encouraged Keep chronic follow up with     Bari Learn, FNP

## 2024-02-23 ENCOUNTER — Ambulatory Visit: Admitting: Family

## 2024-02-23 ENCOUNTER — Encounter: Payer: Self-pay | Admitting: Family

## 2024-02-23 VITALS — BP 135/78 | HR 81 | Temp 98.1°F | Ht 70.0 in | Wt 163.8 lb

## 2024-02-23 DIAGNOSIS — Z1159 Encounter for screening for other viral diseases: Secondary | ICD-10-CM | POA: Diagnosis not present

## 2024-02-23 DIAGNOSIS — F988 Other specified behavioral and emotional disorders with onset usually occurring in childhood and adolescence: Secondary | ICD-10-CM

## 2024-02-23 DIAGNOSIS — E785 Hyperlipidemia, unspecified: Secondary | ICD-10-CM | POA: Diagnosis not present

## 2024-02-23 DIAGNOSIS — Z0001 Encounter for general adult medical examination with abnormal findings: Secondary | ICD-10-CM | POA: Diagnosis not present

## 2024-02-23 DIAGNOSIS — K219 Gastro-esophageal reflux disease without esophagitis: Secondary | ICD-10-CM | POA: Diagnosis not present

## 2024-02-23 DIAGNOSIS — Z0000 Encounter for general adult medical examination without abnormal findings: Secondary | ICD-10-CM

## 2024-02-23 DIAGNOSIS — N1831 Chronic kidney disease, stage 3a: Secondary | ICD-10-CM

## 2024-02-23 DIAGNOSIS — E7800 Pure hypercholesterolemia, unspecified: Secondary | ICD-10-CM

## 2024-02-23 DIAGNOSIS — M5442 Lumbago with sciatica, left side: Secondary | ICD-10-CM | POA: Diagnosis not present

## 2024-02-23 MED ORDER — VENLAFAXINE HCL ER 37.5 MG PO CP24: 37.5000 mg | ORAL_CAPSULE | Freq: Every day | ORAL | 1 refills | Status: AC

## 2024-02-23 MED ORDER — LISDEXAMFETAMINE DIMESYLATE 40 MG PO CAPS: 40.0000 mg | ORAL_CAPSULE | ORAL | 0 refills | Status: DC

## 2024-02-23 MED ORDER — BACLOFEN 10 MG PO TABS: 10.0000 mg | ORAL_TABLET | Freq: Three times a day (TID) | ORAL | 0 refills | Status: DC

## 2024-02-23 MED ORDER — FAMOTIDINE 40 MG PO TABS: 40.0000 mg | ORAL_TABLET | Freq: Two times a day (BID) | ORAL | 0 refills | Status: DC | PRN

## 2024-02-23 MED ORDER — LISDEXAMFETAMINE DIMESYLATE 40 MG PO CAPS
40.0000 mg | ORAL_CAPSULE | ORAL | 0 refills | Status: DC
Start: 2024-02-23 — End: 2024-05-10

## 2024-02-23 MED ORDER — PREDNISONE 10 MG (21) PO TBPK: ORAL_TABLET | ORAL | 0 refills | Status: DC

## 2024-02-23 NOTE — Progress Notes (Signed)
 Subjective:    Patient ID: Candace Allen, female    DOB: [birth year], 58 y.o.   MRN: [id number]  Chief Complaint  Patient presents with   3 month follow up   PT presents to the office today for CPE and ADHD follow up.   Followed by GYN annually.   Candace Allen is currently taking Vyvanse  40 mg daily. This helps her stay and complete tasks.   Candace Allen has CKD and takes lisinopril  daily. Followed by Nephrologists.   Candace Allen has hot flashes and takes Effexor  daily.   Has allergy rhinitis and takes xyzal  5 mg and Singulair  10 mg. Doing well.  Anxiety Presents for follow-up visit. Symptoms include excessive worry, nervous/anxious behavior and restlessness. Symptoms occur occasionally. The severity of symptoms is mild.    Gastroesophageal Reflux Candace Allen complains of belching and heartburn. This is a chronic problem. The current episode started more than 1 year ago. The problem occurs occasionally. The symptoms are aggravated by certain foods. Candace Allen has tried a PPI and a histamine-2 antagonist for the symptoms. The treatment provided moderate relief.  Hyperlipidemia This is a chronic problem. The current episode started more than 1 year ago. The problem is uncontrolled. Associated symptoms include leg pain. Current antihyperlipidemic treatment includes diet change and exercise. The current treatment provides mild improvement of lipids. Risk factors for coronary artery disease include dyslipidemia and post-menopausal.  Back Pain This is a new problem. The current episode started more than 1 month ago. The problem has been waxing and waning since onset. The pain is present in the lumbar spine and gluteal. The quality of the pain is described as aching. The pain radiates to the left thigh. The pain is at a severity of 9/10. The pain is moderate. The symptoms are aggravated by twisting and bending. Associated symptoms include leg pain. Candace Allen has tried bed rest, home exercises and ice for the symptoms. The treatment provided  mild relief.      Review of Systems  Gastrointestinal:  Positive for heartburn.  Musculoskeletal:  Positive for back pain.  Psychiatric/Behavioral:  The patient is nervous/anxious.   All other systems reviewed and are negative.   Social History   Socioeconomic History   Marital status: Married    Spouse name: Not on file   Number of children: 2   Years of education: Not on file   Highest education level: Some college, no degree  Occupational History   Occupation: Oceanographer  Tobacco Use   Smoking status: Never    Passive exposure: Never   Smokeless tobacco: Never  Vaping Use   Vaping status: Never Used  Substance and Sexual Activity   Alcohol use: Yes    Alcohol/week: 2.0 standard drinks of alcohol    Types: 2 Standard drinks or equivalent per week    Comment: social   Drug use: Never   Sexual activity: Yes    Birth control/protection: Surgical    Comment: tubal & ablation  Other Topics Concern   Not on file  Social History Narrative   Married, 2 children--living 2 story home w/ 18 steps   Educ: 12+.   Grew up in [place].  [place] area approximately 2016.   Occup: publisher->[employer]   No tob   Left hand   Social Drivers of Health   Financial Resource Strain: Low Risk  (11/28/2023)   Overall Financial Resource Strain (CARDIA)    Difficulty of Paying Living Expenses: Not hard at all  Food Insecurity: No Food Insecurity (11/28/2023)  Hunger Vital Sign    Worried About Running Out of Food in the Last Year: Never true    Ran Out of Food in the Last Year: Never true  Transportation Needs: No Transportation Needs (11/28/2023)   PRAPARE - Administrator, Civil Service (Medical): No    Lack of Transportation (Non-Medical): No  Physical Activity: Sufficiently Active (11/28/2023)   Exercise Vital Sign    Days of Exercise per Week: 5 days    Minutes of Exercise per Session: 90 min  Stress: Stress Concern Present (11/28/2023)   [employer] of Occupational Health - Occupational Stress Questionnaire    Feeling of Stress: To some extent  Social Connections: Moderately Integrated (11/28/2023)   Social Connection and Isolation Panel    Frequency of Communication with Friends and Family: More than three times a week    Frequency of Social Gatherings with Friends and Family: Twice a week    Attends Religious Services: Patient declined    Database administrator or Organizations: Yes    Attends Engineer, structural: Not on file    Marital Status: Married   Family History  Problem Relation Age of Onset   Peripheral Artery Disease Mother    Stroke Mother    Other Father        Hx unknown to patient   Healthy Daughter    Hypotension Maternal Grandmother    Aortic dissection Maternal Grandmother    Heart disease Maternal Grandfather    Diabetes Paternal Grandmother        AODM   Diabetes Paternal Grandfather        AODM   Testicular cancer Brother    Esophageal cancer Brother    Early death Brother    Healthy Son    Ovarian cancer Paternal Great-grandmother    Breast cancer Neg Hx         Objective:   Physical Exam Vitals reviewed.  Constitutional:      General: Candace Allen is not in acute distress.    Appearance: Candace Allen is well-developed.  HENT:     Head: Normocephalic and atraumatic.     Right Ear: Tympanic membrane normal.     Left Ear: Tympanic membrane normal.  Eyes:     Pupils: Pupils are equal, round, and reactive to light.  Neck:     Thyroid : No thyromegaly.  Cardiovascular:     Rate and Rhythm: Normal rate and regular rhythm.     Heart sounds: Normal heart sounds. No murmur heard. Pulmonary:     Effort: Pulmonary effort is normal. No respiratory distress.     Breath sounds: Normal breath sounds. No wheezing.  Abdominal:     General: Bowel sounds are normal. There is no distension.     Palpations: Abdomen is soft.     Tenderness: There is no abdominal tenderness.  Musculoskeletal:         General: No tenderness. Normal range of motion.     Cervical back: Normal range of motion and neck supple.  Skin:    General: Skin is warm and dry.  Neurological:     Mental Status: Candace Allen is alert and oriented to person, place, and time.     Cranial Nerves: No cranial nerve deficit.     Deep Tendon Reflexes: Reflexes are normal and symmetric.  Psychiatric:        Behavior: Behavior normal.        Thought Content: Thought content normal.  Judgment: Judgment normal.       BP 135/78   Pulse 81   Temp 98.1 F (36.7 C)   Ht 5' 10 (1.778 m)   Wt 163 lb 12.8 oz (74.3 kg)   SpO2 99%   BMI 23.50 kg/m      Assessment & Plan:  Shireen Rayburn comes in today with chief complaint of 3 month follow up   Diagnosis and orders addressed:  1. Gastroesophageal reflux disease, unspecified whether esophagitis present - famotidine  (PEPCID ) 40 MG tablet; Take 1 tablet (40 mg total) by mouth 2 (two) times daily as needed for heartburn/reflux.  Dispense: 180 tablet; Refill: 0 - CMP14+EGFR  2. Attention deficit disorder (ADD) in adult Meds as prescribed Behavior modification as needed Follow-up for recheck in 3 months - lisdexamfetamine (VYVANSE ) 40 MG capsule; Take 1 capsule (40 mg total) by mouth every morning.  Dispense: 30 capsule; Refill: 0 - lisdexamfetamine (VYVANSE ) 40 MG capsule; Take 1 capsule (40 mg total) by mouth every morning.  Dispense: 30 capsule; Refill: 0 - lisdexamfetamine (VYVANSE ) 40 MG capsule; Take 1 capsule (40 mg total) by mouth every morning.  Dispense: 30 capsule; Refill: 0 - CMP14+EGFR  3. Annual physical exam (Primary) - CMP14+EGFR - CBC with Differential/Platelet - Hepatitis B surface antibody,quantitative  4. Stage 3a chronic kidney disease (HCC) - CMP14+EGFR  5. Pure hypercholesterolemia - CMP14+EGFR  6. Hyperlipidemia, unspecified hyperlipidemia type - CMP14+EGFR  7. Need for hepatitis C screening test - CMP14+EGFR - Hepatitis C antibody  8.  Need for hepatitis B screening test - CMP14+EGFR - Hepatitis B surface antibody,quantitative  9. Acute left-sided low back pain with left-sided sciatica Rest Ice ROM exercises encouraged, handout given  No NSAIDs because of CKD Sedation precautions discussed with baclofen   Continue with chiropractor visits - CMP14+EGFR - predniSONE  (STERAPRED UNI-PAK 21 TAB) 10 MG (21) TBPK tablet; Use as directed  Dispense: 21 tablet; Refill: 0 - baclofen  (LIORESAL ) 10 MG tablet; Take 1 tablet (10 mg total) by mouth 3 (three) times daily.  Dispense: 30 each; Refill: 0   Labs pending Continue current medications  Keep follow up with specialists  Health Maintenance reviewed Diet and exercise encouraged  Return in about 3 months (around 05/24/2024), or if symptoms worsen or fail to improve.    Candace Learn, FNP

## 2024-02-23 NOTE — Patient Instructions (Signed)

## 2024-02-24 ENCOUNTER — Ambulatory Visit: Payer: Self-pay | Admitting: Family

## 2024-02-24 LAB — CMP14+EGFR
ALT: 29 IU/L (ref 0–32)
AST: 30 IU/L (ref 0–40)
Albumin: 4.4 g/dL (ref 3.8–4.9)
Alkaline Phosphatase: 94 IU/L (ref 44–121)
BUN/Creatinine Ratio: 18 (ref 9–23)
BUN: 24 mg/dL (ref 6–24)
Bilirubin Total: 0.3 mg/dL (ref 0.0–1.2)
CO2: 22 mmol/L (ref 20–29)
Calcium: 10 mg/dL (ref 8.7–10.2)
Chloride: 101 mmol/L (ref 96–106)
Creatinine, Ser: 1.35 mg/dL — ABNORMAL HIGH (ref 0.57–1.00)
Globulin, Total: 2.2 g/dL (ref 1.5–4.5)
Glucose: 77 mg/dL (ref 70–99)
Potassium: 5.2 mmol/L (ref 3.5–5.2)
Sodium: 137 mmol/L (ref 134–144)
Total Protein: 6.6 g/dL (ref 6.0–8.5)
eGFR: 45 mL/min/1.73 — ABNORMAL LOW (ref >59)

## 2024-02-24 LAB — CBC WITH DIFFERENTIAL/PLATELET
Basophils Absolute: 0 x10E3/uL (ref 0.0–0.2)
Basos: 1 %
EOS (ABSOLUTE): 0.1 x10E3/uL (ref 0.0–0.4)
Eos: 1 %
Hematocrit: 41.9 % (ref 34.0–46.6)
Hemoglobin: 13.9 g/dL (ref 11.1–15.9)
Immature Grans (Abs): 0 x10E3/uL (ref 0.0–0.1)
Immature Granulocytes: 0 %
Lymphocytes Absolute: 1.5 x10E3/uL (ref 0.7–3.1)
Lymphs: 25 %
MCH: 32.1 pg (ref 26.6–33.0)
MCHC: 33.2 g/dL (ref 31.5–35.7)
MCV: 97 fL (ref 79–97)
Monocytes Absolute: 0.5 x10E3/uL (ref 0.1–0.9)
Monocytes: 8 %
Neutrophils Absolute: 3.8 x10E3/uL (ref 1.4–7.0)
Neutrophils: 65 %
Platelets: 248 x10E3/uL (ref 150–450)
RBC: 4.33 x10E6/uL (ref 3.77–5.28)
RDW: 12.2 % (ref 11.7–15.4)
WBC: 5.9 x10E3/uL (ref 3.4–10.8)

## 2024-02-24 LAB — HEPATITIS C ANTIBODY: Hep C Virus Ab: NONREACTIVE

## 2024-02-24 LAB — HEPATITIS B SURFACE ANTIBODY, QUANTITATIVE: Hepatitis B Surf Ab Quant: <3.5 m[IU]/mL — AB

## 2024-03-08 ENCOUNTER — Encounter: Payer: Self-pay | Admitting: Nurse Practitioner

## 2024-03-08 ENCOUNTER — Ambulatory Visit: Admitting: Nurse Practitioner

## 2024-03-08 VITALS — BP 148/93 | HR 82 | Temp 98.1°F | Ht 70.0 in | Wt 164.0 lb

## 2024-03-08 DIAGNOSIS — G8929 Other chronic pain: Secondary | ICD-10-CM | POA: Diagnosis not present

## 2024-03-08 DIAGNOSIS — R809 Proteinuria, unspecified: Secondary | ICD-10-CM | POA: Diagnosis not present

## 2024-03-08 DIAGNOSIS — M25562 Pain in left knee: Secondary | ICD-10-CM

## 2024-03-08 DIAGNOSIS — R778 Other specified abnormalities of plasma proteins: Secondary | ICD-10-CM | POA: Diagnosis not present

## 2024-03-08 DIAGNOSIS — D631 Anemia in chronic kidney disease: Secondary | ICD-10-CM | POA: Diagnosis not present

## 2024-03-08 DIAGNOSIS — N1831 Chronic kidney disease, stage 3a: Secondary | ICD-10-CM | POA: Diagnosis not present

## 2024-03-08 DIAGNOSIS — D472 Monoclonal gammopathy: Secondary | ICD-10-CM | POA: Diagnosis not present

## 2024-03-08 DIAGNOSIS — I129 Hypertensive chronic kidney disease with stage 1 through stage 4 chronic kidney disease, or unspecified chronic kidney disease: Secondary | ICD-10-CM | POA: Diagnosis not present

## 2024-03-08 DIAGNOSIS — E211 Secondary hyperparathyroidism, not elsewhere classified: Secondary | ICD-10-CM | POA: Diagnosis not present

## 2024-03-08 MED ORDER — PREDNISONE 20 MG PO TABS: 40.0000 mg | ORAL_TABLET | Freq: Every day | ORAL | 0 refills | Status: AC
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
## 2024-03-08 NOTE — Patient Instructions (Signed)
 Chronic Knee Pain, Adult Knee pain that lasts longer than 3 months is called chronic knee pain. You may have pain in one or both knees. Symptoms of chronic knee pain may also include swelling and stiffness. Many conditions can cause chronic knee pain. The most common cause is wear and tear of your knee joint as you get older. Other possible causes include: A disease that causes inflammation of the knee, such as rheumatoid arthritis. This usually affects both knees. A condition called inflammatory arthritis, such as gout. An injury to the knee that causes arthritis. An injury to the knee that damages the ligaments. Ligaments are tissues that connect bones to each other. Runner's knee or pain behind the kneecap. Treatment for chronic knee pain depends on the cause. The main treatments for chronic knee pain are: Doing exercises to help your knee move better and get stronger, called physical therapy. Losing weight if you are overweight. This condition may also be treated with medicines, injections, a knee sleeve or brace, and by using crutches. You health care provider may also recommend rest, ice, pressure (compression), and elevation, also called RICE therapy. Follow these instructions at home: If you have a knee sleeve or brace that can be taken off:  Wear the knee sleeve or brace as told by your provider. Take it off only if your provider says that you can. Check the skin around it every day. Tell your provider if you see problems. Loosen the knee sleeve or brace if your toes tingle, are numb, or turn cold and blue. Keep the knee sleeve or brace clean and dry. Bathing If the knee sleeve or brace is not waterproof: Do not let it get wet. Cover it when you take a bath or shower. Use a cover that does not let any water in. Managing pain, stiffness, and swelling     If told, put heat on the area. Do this as often as told. Use the heat source that your provider recommends, such as a moist  heat pack or a heating pad. If you have a knee sleeve or brace that you can take off, remove it as told. Place a towel between your skin and the heat source. Leave the heat on for 20-30 minutes. If told, put ice on the area. If you have a knee sleeve or brace that you can take off, remove it as told. Put ice in a plastic bag. Place a towel between your skin and the bag. Leave the ice on for 20 minutes, 2-3 times a day. If your skin turns bright red, remove the ice or heat right away to prevent skin damage. The risk of damage is higher if you cannot feel pain, heat, or cold. Move your toes often to reduce stiffness and swelling. Raise the injured area above the level of your heart while you are sitting or lying down. Use a pillow to support your foot as needed. Activity Avoid activities where both feet leave the ground at the same time. Avoid running, jumping rope, and doing jumping jacks. Follow the exercise plan that your provider made for you. Your provider may suggest that you: Avoid activities that make knee pain worse. This may mean that you need to change your exercise routines, sports, or job duties. Wear shoes with cushioned soles. Avoid sports that require running and sudden changes in direction. Do physical therapy. Physical therapy helps your knee move better and get stronger. Exercise as told. Do exercises that increase balance and strength, such as  tai chi and yoga. Do not stand or walk on your injured knee until you're told it's okay. Use crutches as told. Return to normal activities when you're told. Ask what things are safe for you to do. General instructions Take your medicines only as told by your provider. If you are overweight, work with your provider and an expert in healthy eating called a dietitian to set goals to lose weight. Losing even a little weight can reduce knee pain. Being overweight can make your knee hurt more. Do not smoke, vape, or use products with  nicotine or tobacco in them. If you need help quitting, talk with your provider. Keep all follow-up visits. Your provider will monitor your pain and try other treatments if needed. Contact a health care provider if: You have knee pain that is not getting better or gets worse. You are not able to do your exercises due to knee pain. Get help right away if: Your knee swells and the swelling becomes worse. You cannot move your knee. You have severe knee pain. This information is not intended to replace advice given to you by your health care provider. Make sure you discuss any questions you have with your health care provider. Document Revised: 03/06/2023 Document Reviewed: 07/29/2022 Elsevier Patient Education  2024 [employer].

## 2024-03-16 DIAGNOSIS — I129 Hypertensive chronic kidney disease with stage 1 through stage 4 chronic kidney disease, or unspecified chronic kidney disease: Secondary | ICD-10-CM | POA: Diagnosis not present

## 2024-03-16 DIAGNOSIS — N270 Small kidney, unilateral: Secondary | ICD-10-CM | POA: Diagnosis not present

## 2024-03-16 DIAGNOSIS — N1831 Chronic kidney disease, stage 3a: Secondary | ICD-10-CM | POA: Diagnosis not present

## 2024-03-23 DIAGNOSIS — M25562 Pain in left knee: Secondary | ICD-10-CM | POA: Diagnosis not present

## 2024-03-29 ENCOUNTER — Other Ambulatory Visit

## 2024-03-29 DIAGNOSIS — N189 Chronic kidney disease, unspecified: Secondary | ICD-10-CM | POA: Diagnosis not present

## 2024-03-30 ENCOUNTER — Other Ambulatory Visit

## 2024-04-12 ENCOUNTER — Ambulatory Visit: Admitting: Orthopedic Surgery

## 2024-04-20 ENCOUNTER — Emergency Department (HOSPITAL_BASED_OUTPATIENT_CLINIC_OR_DEPARTMENT_OTHER)
Admission: EM | Admit: 2024-04-20 | Discharge: 2024-04-20 | Disposition: A | Discharge status: Home / Self Care | Attending: Emergency Medicine | Admitting: Emergency Medicine

## 2024-04-20 ENCOUNTER — Other Ambulatory Visit: Payer: Self-pay

## 2024-04-20 ENCOUNTER — Encounter (HOSPITAL_BASED_OUTPATIENT_CLINIC_OR_DEPARTMENT_OTHER): Payer: Self-pay

## 2024-04-20 DIAGNOSIS — T782XXA Anaphylactic shock, unspecified, initial encounter: Secondary | ICD-10-CM | POA: Diagnosis not present

## 2024-04-20 DIAGNOSIS — N189 Chronic kidney disease, unspecified: Secondary | ICD-10-CM | POA: Diagnosis not present

## 2024-04-20 DIAGNOSIS — Z7982 Long term (current) use of aspirin: Secondary | ICD-10-CM | POA: Diagnosis not present

## 2024-04-20 DIAGNOSIS — T7840XA Allergy, unspecified, initial encounter: Secondary | ICD-10-CM | POA: Diagnosis present

## 2024-04-20 MED ORDER — METHYLPREDNISOLONE SODIUM SUCC 125 MG IJ SOLR
125.0000 mg | Freq: Once | INTRAMUSCULAR | Status: AC
  Administered 2024-04-20: 125 mg via INTRAVENOUS
  Filled 2024-04-20: qty 2

## 2024-04-20 MED ORDER — PREDNISONE 10 MG PO TABS: ORAL_TABLET | ORAL | 0 refills | Status: AC

## 2024-04-20 MED ORDER — EPINEPHRINE 0.3 MG/0.3ML IJ SOAJ: 0.3000 mg | INTRAMUSCULAR | 0 refills | Status: AC | PRN

## 2024-04-20 MED ORDER — FAMOTIDINE IN NACL 20-0.9 MG/50ML-% IV SOLN
20.0000 mg | Freq: Once | INTRAVENOUS | Status: AC
  Administered 2024-04-20: 20 mg via INTRAVENOUS
  Filled 2024-04-20: qty 50

## 2024-04-20 NOTE — Discharge Instructions (Addendum)
 They were seen for anaphylaxis.  Please pick up your medication and take as prescribed.  If you have to use your EpiPen  in the future, please present to your nearest ER for further evaluation and workup.  Thank you for letting us  treat you today. After forming a physical exam and 4 hour observation, I feel you are safe to go home. Please follow up with your PCP in the next several days and provide them with your records from this visit. Return to the Emergency Room if pain becomes severe or symptoms worsen.

## 2024-04-20 NOTE — ED Provider Notes (Signed)
 [place] EMERGENCY DEPARTMENT AT [facility] Provider Note   CSN: [id number] Arrival date & time: 04/20/24  1906     Patient presents with: Allergic Reaction   Candace Allen is a 58 y.o. 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  Patient reports administering her EpiPen  at 1840 when she began to feel short of breath.  Patient also reports pruritus and urticaria.  Patient also took 100 mg of Benadryl  and 2 tablets of Tylenol  before arrival.  Patient states all of her symptoms except for urticaria and pruritus have resolved.    Allergic Reaction Presenting symptoms: rash        Prior to Admission medications   Medication Sig Start Date End Date Taking? Authorizing Provider  aspirin  EC 81 MG tablet Take 1 tablet (81 mg total) by mouth daily. Swallow whole. 10/22/23   Shahmehdi, Adriana LABOR, MD  baclofen  (LIORESAL ) 10 MG tablet Take 1 tablet (10 mg total) by mouth 3 (three) times daily. Patient not taking: Reported on 03/08/2024 02/23/24   Lavell Lye A, FNP  diclofenac  Sodium (VOLTAREN ) 1 % GEL Apply 2 g topically 4 (four) times daily. 11/07/23   MilianMarry Lenis, FNP  EPINEPHrine  0.3 mg/0.3 mL IJ SOAJ injection Inject 0.3 mg into the muscle as needed for anaphylaxis. 03/10/23   Zelaya, Oscar A, PA-C  EPINEPHrine  0.3 mg/0.3 mL IJ SOAJ injection Inject 0.3 mg into the muscle as needed for anaphylaxis. 04/20/24  Yes Iliya Spivack N, PA-C  famotidine  (PEPCID ) 40 MG tablet Take 1 tablet (40 mg total) by mouth 2 (two) times daily as needed for heartburn/reflux. 02/23/24   Lavell Lye A, FNP  levocetirizine (XYZAL ) 5 MG tablet Take 1 tablet (5 mg total) by mouth every evening. 11/17/23   Lavell Lye A, FNP  lisdexamfetamine (VYVANSE ) 40 MG capsule Take 1 capsule (40 mg total) by mouth every morning. 02/23/24   Lavell Lye A, FNP  lisdexamfetamine (VYVANSE ) 40 MG capsule Take 1 capsule (40 mg  total) by mouth every morning. 03/22/24   Lavell Lye A, FNP  lisdexamfetamine (VYVANSE ) 40 MG capsule Take 1 capsule (40 mg total) by mouth every morning. 03/22/24   Lavell Lye LABOR, FNP  montelukast  (SINGULAIR ) 10 MG tablet Take 1 tablet (10 mg total) by mouth at bedtime. 11/28/23   Lavell Lye LABOR, FNP  omeprazole  (PRILOSEC) 20 MG capsule Take 1 capsule (20 mg total) by mouth daily. 02/10/24   Lavell Lye LABOR, FNP  predniSONE  (DELTASONE ) 10 MG tablet Take 4 tablets (40 mg total) by mouth daily for 2 days, THEN 3 tablets (30 mg total) daily for 2 days, THEN 2 tablets (20 mg total) daily for 2 days, THEN 1 tablet (10 mg total) daily for 1 day. 04/21/24 04/28/24 Yes Francis Ileana SAILOR, PA-C  UNABLE TO FIND Asparagus drops-daily in water; Liver Life-10 drops daily    [provider]  venlafaxine  XR (EFFEXOR -XR) 37.5 MG 24 hr capsule Take 1 capsule (37.5 mg total) by mouth daily with breakfast. 02/23/24   Lavell Lye LABOR, FNP    Allergies: Bee venom, Flagyl  [metronidazole ], Iodine, Shellfish allergy, Tramadol  hcl, Wasp venom, Lactose intolerance (gi), Gluten meal, and Melatonin    Review of Systems  Respiratory:  Positive for shortness of breath.   Skin:  Positive for rash.    Updated Vital Signs BP 126/74   Pulse 72   Temp 97.7 F (36.5 C) (Temporal)  Resp (!) 21   Ht 5' 10 (1.778 m)   Wt 77.1 kg   SpO2 100%   BMI 24.39 kg/m   Physical Exam Vitals and nursing note reviewed.  Constitutional:      General: She is not in acute distress.    Appearance: Normal appearance. She is well-developed. She is not toxic-appearing.  HENT:     Head: Normocephalic and atraumatic.     Mouth/Throat:     Mouth: Mucous membranes are moist.     Tongue: No lesions. Tongue does not deviate from midline.     Pharynx: Oropharynx is clear. Uvula midline. No pharyngeal swelling, posterior oropharyngeal erythema or uvula swelling.  Eyes:     Extraocular Movements: Extraocular movements intact.      Conjunctiva/sclera: Conjunctivae normal.  Cardiovascular:     Rate and Rhythm: Normal rate and regular rhythm.     Pulses: Normal pulses.     Heart sounds: Normal heart sounds. No murmur heard. Pulmonary:     Effort: Pulmonary effort is normal. No respiratory distress.     Breath sounds: Normal breath sounds. No stridor. No wheezing, rhonchi or rales.  Chest:     Chest wall: No tenderness.  Abdominal:     Palpations: Abdomen is soft.     Tenderness: There is no abdominal tenderness.  Musculoskeletal:        General: No swelling.     Cervical back: Neck supple.  Skin:    General: Skin is warm and dry.     Capillary Refill: Capillary refill takes less than 2 seconds.     Findings: Rash present.     Comments: Fine diffuse urticarial rash noted on back, arms, and abdomen with superficial excoriations noted.  No rash or swelling noted to patient's face.  Neurological:     Mental Status: She is alert.  Psychiatric:        Mood and Affect: Mood normal.     (all labs ordered are listed, but only abnormal results are displayed) Labs Reviewed - No data to display  EKG: None  Radiology: No results found.   Procedures   Medications Ordered in the ED  methylPREDNISolone  sodium succinate (SOLU-MEDROL ) 125 mg/2 mL injection 125 mg (125 mg Intravenous Given 04/20/24 2004)  famotidine  (PEPCID ) IVPB 20 mg premix (0 mg Intravenous Stopped 04/20/24 2101)                                    Medical Decision Making Risk Prescription drug management.   This patient presents to the ED for concern of allergic reaction differential diagnosis includes allergic reaction, anaphylaxis   Medicines ordered and prescription drug management:  I ordered medication including Solu-Medrol  and Pepcid  I have reviewed the patients home medicines and have made adjustments as needed   Problem List / ED Course:  4 hours since Epi @ 2240 Considered for admission or further workup however patient's  vital signs and physical exam are reassuring.  Patient has not needed epinephrine  redosed or had recurrence of symptoms while in emergency department.  Reassessment of patient prior to discharge showed lungs CTAB and resolution of previously noted rash patient given outpatient course of prednisone  and refill on EpiPen .  Patient given return precautions.  I feel patient is safe for discharge at this time.      Final diagnoses:  Anaphylaxis, initial encounter    ED Discharge Orders  Ordered    predniSONE  (DELTASONE ) 10 MG tablet  Daily        04/20/24 2247    EPINEPHrine  0.3 mg/0.3 mL IJ SOAJ injection  As needed        04/20/24 2247               Francis Ileana SAILOR, PA-C 04/20/24 2248    Dean Clarity, MD 04/20/24 2309

## 2024-04-20 NOTE — ED Notes (Signed)
 Pt d/c instructions, medications, and follow-up care reviewed with pt. Pt verbalized understanding and had no further questions at time of d/c. Pt CA&Ox4, ambulatory, and in NAD at time of d/c. Discharged with family

## 2024-04-20 NOTE — ED Triage Notes (Signed)
 Stung by bee at 1730, EpiPen  at 1840. SHOB, hives, itching, feels like base of throat is swelling. Airway intact in triage. Respiratory in triage to assess, reports lung sounds clear. Reports took '4 benadryl ', unsure of dosage.

## 2024-04-20 NOTE — ED Notes (Signed)
 RT into triage to assess patient. BS CTAB, no stridor noted. SpO2 100%.

## 2024-04-22 NOTE — Telephone Encounter (Signed)
 FYI for PCP.

## 2024-04-27 ENCOUNTER — Other Ambulatory Visit: Payer: Self-pay

## 2024-04-27 DIAGNOSIS — G8929 Other chronic pain: Secondary | ICD-10-CM

## 2024-04-27 NOTE — Telephone Encounter (Signed)
Please order MRI of knee

## 2024-04-27 NOTE — Telephone Encounter (Signed)
 No further action needed at this time.

## 2024-04-30 ENCOUNTER — Encounter (INDEPENDENT_AMBULATORY_CARE_PROVIDER_SITE_OTHER): Payer: Self-pay

## 2024-04-30 DIAGNOSIS — R42 Dizziness and giddiness: Secondary | ICD-10-CM

## 2024-04-30 DIAGNOSIS — Z77120 Contact with and (suspected) exposure to mold (toxic): Secondary | ICD-10-CM

## 2024-04-30 NOTE — Telephone Encounter (Signed)
 Hello, I wouldn't think that would cause your problems. May have just been coincidence. The dizziness and things could be weather changes or allergies.   Bari Learn, FNP    Approximately 5 minutes was spent documenting and reviewing patient's chart.

## 2024-05-07 ENCOUNTER — Ambulatory Visit (HOSPITAL_BASED_OUTPATIENT_CLINIC_OR_DEPARTMENT_OTHER)
Admission: RE | Admit: 2024-05-07 | Discharge: 2024-05-07 | Disposition: A | Discharge status: Home / Self Care | Source: Ambulatory Visit | Attending: Nurse Practitioner | Admitting: Nurse Practitioner

## 2024-05-07 ENCOUNTER — Other Ambulatory Visit: Payer: Self-pay | Admitting: Nurse Practitioner

## 2024-05-07 DIAGNOSIS — M25562 Pain in left knee: Secondary | ICD-10-CM | POA: Diagnosis present

## 2024-05-07 DIAGNOSIS — G8929 Other chronic pain: Secondary | ICD-10-CM

## 2024-05-07 DIAGNOSIS — M1712 Unilateral primary osteoarthritis, left knee: Secondary | ICD-10-CM | POA: Diagnosis not present

## 2024-05-07 DIAGNOSIS — M948X6 Other specified disorders of cartilage, lower leg: Secondary | ICD-10-CM | POA: Diagnosis not present

## 2024-05-07 DIAGNOSIS — M7122 Synovial cyst of popliteal space [Baker], left knee: Secondary | ICD-10-CM | POA: Diagnosis not present

## 2024-05-07 DIAGNOSIS — S83242A Other tear of medial meniscus, current injury, left knee, initial encounter: Secondary | ICD-10-CM | POA: Diagnosis not present

## 2024-05-10 ENCOUNTER — Ambulatory Visit: Admitting: Family

## 2024-05-10 ENCOUNTER — Encounter: Payer: Self-pay | Admitting: Family

## 2024-05-10 ENCOUNTER — Telehealth: Payer: Self-pay

## 2024-05-10 VITALS — BP 130/85 | HR 75 | Temp 97.9°F | Ht 70.0 in | Wt 163.6 lb

## 2024-05-10 DIAGNOSIS — K219 Gastro-esophageal reflux disease without esophagitis: Secondary | ICD-10-CM | POA: Diagnosis not present

## 2024-05-10 DIAGNOSIS — F988 Other specified behavioral and emotional disorders with onset usually occurring in childhood and adolescence: Secondary | ICD-10-CM | POA: Diagnosis not present

## 2024-05-10 DIAGNOSIS — G8929 Other chronic pain: Secondary | ICD-10-CM

## 2024-05-10 DIAGNOSIS — E7800 Pure hypercholesterolemia, unspecified: Secondary | ICD-10-CM

## 2024-05-10 DIAGNOSIS — N1831 Chronic kidney disease, stage 3a: Secondary | ICD-10-CM | POA: Diagnosis not present

## 2024-05-10 DIAGNOSIS — Z780 Asymptomatic menopausal state: Secondary | ICD-10-CM | POA: Diagnosis not present

## 2024-05-10 DIAGNOSIS — M5442 Lumbago with sciatica, left side: Secondary | ICD-10-CM | POA: Diagnosis not present

## 2024-05-10 DIAGNOSIS — E785 Hyperlipidemia, unspecified: Secondary | ICD-10-CM

## 2024-05-10 MED ORDER — LISDEXAMFETAMINE DIMESYLATE 50 MG PO CAPS: 50.0000 mg | ORAL_CAPSULE | Freq: Every day | ORAL | 0 refills | Status: AC

## 2024-05-10 MED ORDER — FAMOTIDINE 40 MG PO TABS: 40.0000 mg | ORAL_TABLET | Freq: Two times a day (BID) | ORAL | 2 refills | Status: AC | PRN

## 2024-05-10 MED ORDER — BACLOFEN 10 MG PO TABS: 10.0000 mg | ORAL_TABLET | Freq: Three times a day (TID) | ORAL | 2 refills | Status: AC

## 2024-05-10 MED ORDER — LISDEXAMFETAMINE DIMESYLATE 40 MG PO CAPS
40.0000 mg | ORAL_CAPSULE | ORAL | 0 refills | Status: DC
Start: 2024-06-07 — End: 2024-05-10

## 2024-05-10 MED ORDER — LISDEXAMFETAMINE DIMESYLATE 40 MG PO CAPS
40.0000 mg | ORAL_CAPSULE | ORAL | 0 refills | Status: DC
Start: 2024-07-08 — End: 2024-05-10

## 2024-05-10 MED ORDER — LISDEXAMFETAMINE DIMESYLATE 40 MG PO CAPS
40.0000 mg | ORAL_CAPSULE | ORAL | 0 refills | Status: DC
Start: 2024-05-10 — End: 2024-05-10

## 2024-05-10 NOTE — Telephone Encounter (Signed)
 Copied from CRM [id number]. Topic: Clinical - Prescription Issue >> May 10, 2024 10:23 AM Diannia H wrote: Reason for CRM: Melanie from [facility] called about the patient and needs clarification on her medicine for VYVANSE  40MG  and VYVANSE  50MG  and which one she needs to take so she can get it filled. Could you assist?   Callback number is [phone].

## 2024-05-10 NOTE — Patient Instructions (Signed)

## 2024-05-10 NOTE — Telephone Encounter (Signed)
 Spoke with pharmacy staff and informed them 50mg  is the correct dose

## 2024-05-10 NOTE — Progress Notes (Addendum)
 Subjective:    Patient ID: Candace Allen, female    DOB: 02-28-1966, 58 y.o.   MRN: [id number]  Chief Complaint  Patient presents with   Medical Management of Chronic Issues   PT presents to the office today for chronic follow up and ADHD follow up.   Followed by GYN annually.   Candace Allen is currently taking Vyvanse  40 mg daily. This helps her stay and complete tasks.   Candace Allen has CKD and takes lisinopril  daily. Followed by Nephrologists.   Candace Allen is having left knee pain and had a MRI on 05/07/24.  Candace Allen has hot flashes and takes Effexor  daily.   Has allergy rhinitis and takes xyzal  5 mg and Singulair  10 mg. Doing well.  Anxiety Presents for follow-up visit. Symptoms include excessive worry, nervous/anxious behavior and restlessness. Symptoms occur occasionally. The severity of symptoms is mild.    Gastroesophageal Reflux Candace Allen complains of belching and heartburn. This is a chronic problem. The current episode started more than 1 year ago. The problem occurs frequently. The symptoms are aggravated by certain foods. Candace Allen has tried a PPI and a histamine-2 antagonist for the symptoms. The treatment provided moderate relief.  Hyperlipidemia This is a chronic problem. The current episode started more than 1 year ago. The problem is uncontrolled. Associated symptoms include leg pain. Current antihyperlipidemic treatment includes diet change and exercise. The current treatment provides mild improvement of lipids. Risk factors for coronary artery disease include dyslipidemia and post-menopausal.  Back Pain This is a new problem. The current episode started more than 1 month ago. The problem has been waxing and waning since onset. The pain is present in the lumbar spine and gluteal. The quality of the pain is described as aching. The pain radiates to the left thigh. The pain is at a severity of 8/10 (worse in the AM). The pain is moderate. The symptoms are aggravated by twisting and bending. Associated  symptoms include leg pain. Candace Allen has tried bed rest, home exercises and ice for the symptoms. The treatment provided mild relief.      Review of Systems  Gastrointestinal:  Positive for heartburn.  Musculoskeletal:  Positive for back pain.  Psychiatric/Behavioral:  The patient is nervous/anxious.   All other systems reviewed and are negative.   Social History   Socioeconomic History   Marital status: Married    Spouse name: Not on file   Number of children: 2   Years of education: Not on file   Highest education level: Some college, no degree  Occupational History   Occupation: oceanographer  Tobacco Use   Smoking status: Never    Passive exposure: Never   Smokeless tobacco: Never  Vaping Use   Vaping status: Never Used  Substance and Sexual Activity   Alcohol use: Yes    Alcohol/week: 2.0 standard drinks of alcohol    Types: 2 Standard drinks or equivalent per week    Comment: social   Drug use: Never   Sexual activity: Yes    Birth control/protection: Surgical    Comment: tubal & ablation  Other Topics Concern   Not on file  Social History Narrative   Married, 2 children--living 2 story home w/ 18 steps   Educ: 12+.   Grew up in [place].  [place] area approximately [place].   Occup: publisher->[employer]   No tob   Left hand   Social Drivers of Health   Financial Resource Strain: Low Risk  (11/28/2023)   Overall Physicist, Medical Strain (  CARDIA)    Difficulty of Paying Living Expenses: Not hard at all  Food Insecurity: No Food Insecurity (11/28/2023)   Hunger Vital Sign    Worried About Running Out of Food in the Last Year: Never true    Ran Out of Food in the Last Year: Never true  Transportation Needs: No Transportation Needs (11/28/2023)   PRAPARE - Administrator, Civil Service (Medical): No    Lack of Transportation (Non-Medical): No  Physical Activity: Sufficiently Active (11/28/2023)   Exercise Vital Sign    Days of Exercise per  Week: 5 days    Minutes of Exercise per Session: 90 min  Stress: Stress Concern Present (11/28/2023)   [employer] of Occupational Health - Occupational Stress Questionnaire    Feeling of Stress: To some extent  Social Connections: Moderately Integrated (11/28/2023)   Social Connection and Isolation Panel    Frequency of Communication with Friends and Family: More than three times a week    Frequency of Social Gatherings with Friends and Family: Twice a week    Attends Religious Services: Patient declined    Database Administrator or Organizations: Yes    Attends Engineer, Structural: Not on file    Marital Status: Married   Family History  Problem Relation Age of Onset   Peripheral Artery Disease Mother    Stroke Mother    Other Father        Hx unknown to patient   Healthy Daughter    Hypotension Maternal Grandmother    Aortic dissection Maternal Grandmother    Heart disease Maternal Grandfather    Diabetes Paternal Grandmother        AODM   Diabetes Paternal Grandfather        AODM   Testicular cancer Brother    Esophageal cancer Brother    Early death Brother    Healthy Son    Ovarian cancer Paternal Great-grandmother    Breast cancer Neg Hx         Objective:   Physical Exam Vitals reviewed.  Constitutional:      General: Candace Allen is not in acute distress.    Appearance: Candace Allen is well-developed.  HENT:     Head: Normocephalic and atraumatic.     Right Ear: Tympanic membrane normal.     Left Ear: Tympanic membrane normal.  Eyes:     Pupils: Pupils are equal, round, and reactive to light.  Neck:     Thyroid : No thyromegaly.  Cardiovascular:     Rate and Rhythm: Normal rate and regular rhythm.     Heart sounds: Normal heart sounds. No murmur heard. Pulmonary:     Effort: Pulmonary effort is normal. No respiratory distress.     Breath sounds: Normal breath sounds. No wheezing.  Abdominal:     General: Bowel sounds are normal. There is no distension.      Palpations: Abdomen is soft.     Tenderness: There is no abdominal tenderness.  Musculoskeletal:        General: No tenderness. Normal range of motion.     Cervical back: Normal range of motion and neck supple.  Skin:    General: Skin is warm and dry.  Neurological:     Mental Status: Candace Allen is alert and oriented to person, place, and time.     Cranial Nerves: No cranial nerve deficit.     Deep Tendon Reflexes: Reflexes are normal and symmetric.  Psychiatric:  Behavior: Behavior normal.        Thought Content: Thought content normal.        Judgment: Judgment normal.       BP 130/85   Pulse 75   Temp 97.9 F (36.6 C) (Temporal)   Ht 5' 10 (1.778 m)   Wt 163 lb 9.6 oz (74.2 kg)   BMI 23.47 kg/m      Assessment & Plan:  Marrietta Thunder comes in today with chief complaint of Medical Management of Chronic Issues   Diagnosis and orders addressed:  1. Attention deficit disorder (ADD) in adult (Primary) Will increase Vyvanse  to 50 mg from 40 mg  Meds as prescribed Behavior modification as needed Follow-up for recheck in 3 months - lisdexamfetamine (VYVANSE ) 50 MG capsule; Take 1 capsule (50 mg total) by mouth daily before breakfast.  Dispense: 30 capsule; Refill: 0 - lisdexamfetamine (VYVANSE ) 50 MG capsule; Take 1 capsule (50 mg total) by mouth daily before breakfast.  Dispense: 30 capsule; Refill: 0 - lisdexamfetamine (VYVANSE ) 50 MG capsule; Take 1 capsule (50 mg total) by mouth daily before breakfast.  Dispense: 30 capsule; Refill: 0  2. Gastroesophageal reflux disease, unspecified whether esophagitis present - famotidine  (PEPCID ) 40 MG tablet; Take 1 tablet (40 mg total) by mouth 2 (two) times daily as needed for heartburn/reflux.  Dispense: 180 tablet; Refill: 2  3. Hyperlipidemia, unspecified hyperlipidemia type Low fat diet   4. Stage 3a chronic kidney disease (HCC) Avoid NSAIDs  5. Pure hypercholesterolemia Low fat diet   6. Postmenopausal  7.  Left-sided low back pain with left-sided sciatica ROM exercises  - baclofen  (LIORESAL ) 10 MG tablet; Take 1 tablet (10 mg total) by mouth 3 (three) times daily.  Dispense: 30 each; Refill: 2   Labs reviewed from last visit  Continue current medications, Will increase Vyvanse  to 50 mg from 40 mg Keep follow up with specialists  Health Maintenance reviewed Diet and exercise encouraged  Return in about 3 months (around 08/10/2024), or if symptoms worsen or fail to improve.    Bari Learn, FNP

## 2024-05-17 ENCOUNTER — Ambulatory Visit: Payer: Self-pay | Admitting: Nurse Practitioner

## 2024-05-17 DIAGNOSIS — S83209A Unspecified tear of unspecified meniscus, current injury, unspecified knee, initial encounter: Secondary | ICD-10-CM

## 2024-05-20 ENCOUNTER — Ambulatory Visit: Admitting: Orthopedic Surgery

## 2024-05-20 DIAGNOSIS — M19049 Primary osteoarthritis, unspecified hand: Secondary | ICD-10-CM

## 2024-05-20 MED ORDER — METHYLPREDNISOLONE ACETATE 40 MG/ML IJ SUSP
40.0000 mg | Freq: Once | INTRAMUSCULAR | Status: AC
  Administered 2024-05-20: 40 mg via INTRA_ARTICULAR

## 2024-05-20 NOTE — Progress Notes (Signed)
 SABRA

## 2024-05-20 NOTE — Progress Notes (Signed)
 FOLLOW-UP OFFICE VISIT   Patient: Candace Allen           Date of Birth: 05/28/1966           MRN: [id number] Visit Date: 05/20/2024 Requested by: Lavell Bari LABOR, FNP [address] [place],  [place] [address] PCP: Lavell Bari LABOR, FNP    Encounter Diagnosis  Name Primary?   CMC arthritis- RIGHT Yes    Chief Complaint  Patient presents with   Injections    The patient is 58 years old she has lots of orthopedic issues of the left knee with a meniscal tear some left leg sciatica but she comes in today for reinjection of the [facility] joint of the right wrist which gave her good relief last time in July  She notices increased pain over the right wrist it is interfering with her typing she is having trouble lifting things and turning objects as well  Tenderness over the [facility] joint with crepitation noted over the right thumb  ASSESSMENT AND PLAN Encounter Diagnosis  Name Primary?   CMC arthritis- RIGHT Yes    Procedure note injection right thumb  Injection right CMC joint Medication Depo-Medrol  40 mg and lidocaine  1% 2 cc The patient gave verbal consent Timeout confirmed the site of injection right CMC joint  Alcohol and ethyl chloride was used to repair the skin and then a 25-gauge needle was used to inject the [facility] joint of the right thumb  There were no complications a sterile Band-Aid was applied

## 2024-05-21 ENCOUNTER — Ambulatory Visit: Admitting: Orthopedic Surgery

## 2024-05-21 ENCOUNTER — Telehealth: Payer: Self-pay

## 2024-05-21 NOTE — Telephone Encounter (Signed)
 Copied from CRM #[id number]. Topic: Referral - Question >> May 21, 2024  2:18 PM Leonette P wrote: Reason for CRM: patient wants to know where she is being referred for orthocare.  She said she seen the referral in her my chart but it did not say to [facility] in [place].   Dr. Alm Going  Please call her back [phone]

## 2024-05-21 NOTE — Telephone Encounter (Signed)
 Called and spoke with patient and confirmed that is where her referral has been sent as requested.

## 2024-05-24 DIAGNOSIS — M1712 Unilateral primary osteoarthritis, left knee: Secondary | ICD-10-CM | POA: Diagnosis not present

## 2024-05-24 DIAGNOSIS — S83232A Complex tear of medial meniscus, current injury, left knee, initial encounter: Secondary | ICD-10-CM | POA: Diagnosis not present

## 2024-05-25 ENCOUNTER — Ambulatory Visit: Payer: Self-pay | Admitting: Family

## 2024-05-26 ENCOUNTER — Other Ambulatory Visit: Payer: Self-pay | Admitting: Family

## 2024-05-26 DIAGNOSIS — K219 Gastro-esophageal reflux disease without esophagitis: Secondary | ICD-10-CM

## 2024-06-01 ENCOUNTER — Other Ambulatory Visit: Payer: Self-pay | Admitting: Family

## 2024-06-03 ENCOUNTER — Ambulatory Visit: Admitting: Orthopedic Surgery

## 2024-06-03 NOTE — Addendum Note (Signed)
 Addended by: LAVELL LYE A on: 06/03/2024 03:57 PM   Modules accepted: Orders

## 2024-06-14 ENCOUNTER — Emergency Department (HOSPITAL_COMMUNITY)

## 2024-06-14 ENCOUNTER — Other Ambulatory Visit: Payer: Self-pay

## 2024-06-14 ENCOUNTER — Ambulatory Visit: Payer: Self-pay

## 2024-06-14 ENCOUNTER — Emergency Department (HOSPITAL_COMMUNITY)
Admission: EM | Admit: 2024-06-14 | Discharge: 2024-06-14 | Disposition: A | Discharge status: Home / Self Care | Attending: Emergency Medicine | Admitting: Emergency Medicine

## 2024-06-14 ENCOUNTER — Encounter (HOSPITAL_COMMUNITY): Payer: Self-pay | Admitting: Emergency Medicine

## 2024-06-14 ENCOUNTER — Ambulatory Visit

## 2024-06-14 VITALS — Ht 70.0 in | Wt 163.0 lb

## 2024-06-14 DIAGNOSIS — N12 Tubulo-interstitial nephritis, not specified as acute or chronic: Secondary | ICD-10-CM | POA: Insufficient documentation

## 2024-06-14 DIAGNOSIS — Z8542 Personal history of malignant neoplasm of other parts of uterus: Secondary | ICD-10-CM | POA: Diagnosis not present

## 2024-06-14 DIAGNOSIS — N1831 Chronic kidney disease, stage 3a: Secondary | ICD-10-CM | POA: Insufficient documentation

## 2024-06-14 DIAGNOSIS — Z7982 Long term (current) use of aspirin: Secondary | ICD-10-CM | POA: Diagnosis not present

## 2024-06-14 DIAGNOSIS — R10A2 Flank pain, left side: Secondary | ICD-10-CM | POA: Diagnosis present

## 2024-06-14 DIAGNOSIS — E039 Hypothyroidism, unspecified: Secondary | ICD-10-CM | POA: Diagnosis not present

## 2024-06-14 DIAGNOSIS — Z1211 Encounter for screening for malignant neoplasm of colon: Secondary | ICD-10-CM

## 2024-06-14 LAB — BASIC METABOLIC PANEL WITH GFR
Anion gap: 15 (ref 5–15)
BUN: 10 mg/dL (ref 6–20)
CO2: 22 mmol/L (ref 22–32)
Calcium: 9.7 mg/dL (ref 8.9–10.3)
Chloride: 102 mmol/L (ref 98–111)
Creatinine, Ser: 1.08 mg/dL — ABNORMAL HIGH (ref 0.44–1.00)
GFR, Estimated: 59 mL/min — ABNORMAL LOW
Glucose, Bld: 92 mg/dL (ref 70–99)
Potassium: 4 mmol/L (ref 3.5–5.1)
Sodium: 138 mmol/L (ref 135–145)

## 2024-06-14 LAB — URINALYSIS, ROUTINE W REFLEX MICROSCOPIC
Bilirubin Urine: NEGATIVE
Glucose, UA: NEGATIVE mg/dL
Ketones, ur: NEGATIVE mg/dL
Nitrite: POSITIVE — AB
Protein, ur: 100 mg/dL — AB
Specific Gravity, Urine: 1.01 (ref 1.005–1.030)
WBC, UA: >50 WBC/hpf (ref 0–5)
pH: 6 (ref 5.0–8.0)

## 2024-06-14 LAB — CBC
HCT: 40.4 % (ref 36.0–46.0)
Hemoglobin: 13 g/dL (ref 12.0–15.0)
MCH: 31.3 pg (ref 26.0–34.0)
MCHC: 32.2 g/dL (ref 30.0–36.0)
MCV: 97.3 fL (ref 80.0–100.0)
Platelets: 211 K/uL (ref 150–400)
RBC: 4.15 MIL/uL (ref 3.87–5.11)
RDW: 12.4 % (ref 11.5–15.5)
WBC: 10.6 K/uL — ABNORMAL HIGH (ref 4.0–10.5)
nRBC: 0 % (ref 0.0–0.2)

## 2024-06-14 MED ORDER — OXYCODONE HCL 5 MG PO TABS: 5.0000 mg | ORAL_TABLET | ORAL | 0 refills | Status: AC | PRN

## 2024-06-14 MED ORDER — NA SULFATE-K SULFATE-MG SULF 17.5-3.13-1.6 GM/177ML PO SOLN: 1.0000 | Freq: Once | ORAL | 0 refills | Status: AC

## 2024-06-14 MED ORDER — SODIUM CHLORIDE 0.9 % IV BOLUS
1000.0000 mL | Freq: Once | INTRAVENOUS | Status: AC
  Administered 2024-06-14: 1000 mL via INTRAVENOUS

## 2024-06-14 MED ORDER — PHENAZOPYRIDINE HCL 200 MG PO TABS: 200.0000 mg | ORAL_TABLET | Freq: Three times a day (TID) | ORAL | 0 refills | Status: AC

## 2024-06-14 MED ORDER — CEFDINIR 300 MG PO CAPS: 300.0000 mg | ORAL_CAPSULE | Freq: Two times a day (BID) | ORAL | 0 refills | Status: AC

## 2024-06-14 MED ORDER — ONDANSETRON HCL 4 MG PO TABS: 4.0000 mg | ORAL_TABLET | Freq: Three times a day (TID) | ORAL | 0 refills | Status: AC | PRN

## 2024-06-14 MED ORDER — ONDANSETRON HCL 4 MG/2ML IJ SOLN
4.0000 mg | Freq: Once | INTRAMUSCULAR | Status: AC
  Administered 2024-06-14: 4 mg via INTRAVENOUS
  Filled 2024-06-14: qty 2

## 2024-06-14 MED ORDER — SODIUM CHLORIDE 0.9 % IV SOLN
1.0000 g | Freq: Once | INTRAVENOUS | Status: AC
  Administered 2024-06-14: 1 g via INTRAVENOUS
  Filled 2024-06-14: qty 10

## 2024-06-14 MED ORDER — KETOROLAC TROMETHAMINE 15 MG/ML IJ SOLN
15.0000 mg | Freq: Once | INTRAMUSCULAR | Status: AC
  Administered 2024-06-14: 15 mg via INTRAVENOUS
  Filled 2024-06-14: qty 1

## 2024-06-14 MED ORDER — ACETAMINOPHEN 325 MG PO TABS: 650.0000 mg | ORAL_TABLET | Freq: Four times a day (QID) | ORAL | 0 refills | Status: AC | PRN

## 2024-06-14 MED ORDER — MORPHINE SULFATE (PF) 2 MG/ML IV SOLN
2.0000 mg | Freq: Once | INTRAVENOUS | Status: AC
  Administered 2024-06-14: 2 mg via INTRAVENOUS
  Filled 2024-06-14: qty 1

## 2024-06-14 NOTE — ED Notes (Signed)
 PT completed fluid challenge and consumed of water without vomiting.

## 2024-06-14 NOTE — ED Triage Notes (Signed)
 Left flank pain/back pain x 1 week. 4 days of urinary retention. Hx of kidney stones. Pt grimacing in pain. Color wnl.

## 2024-06-14 NOTE — ED Notes (Addendum)
 Pt is drinking of water at this time.

## 2024-06-14 NOTE — Telephone Encounter (Signed)
 Noted

## 2024-06-14 NOTE — ED Provider Notes (Signed)
 " [place] EMERGENCY DEPARTMENT AT [facility] Provider Note  CSN: [id number] Arrival date & time: 06/14/24 1008  Chief Complaint(s) Flank Pain  HPI Candace Allen is a 58 y.o. female with past medical history as below, significant for CKD 3, diverticulosis, GERD, hypothyroid who presents to the ED with complaint of abdominal pain, nausea, dysuria  She has been feeling unwell over the past 2 days, left-sided flank pain left lower quad abdominal pain, dysuria and urgency.  Nausea no vomiting.  Subjective fever, no objective fever.  No chills.  No recent travel or sick contacts.  No hematuria.  No rashes.  No trauma.  Past Medical History Past Medical History:  Diagnosis Date   Allergy    Attention deficit disorder (ADD) in adult    Chronic renal insufficiency, stage 3 (moderate)    ?d/t dexilant   Diverticulosis 02/19/2018   By colonoscopy 2016   Endometrial cancer (HCC)    GERD (gastroesophageal reflux disease)    Hx of concussion    Approximately 2016   Hypothyroidism    question of-->no hx of thyroid  lab abnormalities in record   Hypothyroidism (acquired) 08/20/2019   IBS (irritable bowel syndrome)    TIA (transient ischemic attack) 10/20/2020   Patient Active Problem List   Diagnosis Date Noted   Hyperlipidemia 11/28/2023   Environmental and seasonal allergies 09/02/2023   Stage 3a chronic kidney disease (HCC) 07/22/2023   Attention deficit disorder (ADD) in adult 07/17/2023   Pure hypercholesterolemia 07/17/2023   Calculus of gallbladder without cholecystitis without obstruction    Postmenopausal 11/09/2019   History of endometrial cancer 02/19/2018   GERD (gastroesophageal reflux disease) 02/19/2018   IBS (irritable bowel syndrome) 02/19/2018   Diverticulosis 02/19/2018   Home Medication(s) Prior to Admission medications  Medication Sig Start Date End Date Taking? Authorizing Provider  acetaminophen  (TYLENOL ) 325 MG tablet Take 2 tablets (650 mg total)  by mouth every 6 (six) hours as needed. 06/14/24  Yes Elnor Savant A, DO  cefdinir (OMNICEF) 300 MG capsule Take 1 capsule (300 mg total) by mouth 2 (two) times daily for 14 days. 06/14/24 06/28/24 Yes Elnor Savant A, DO  ondansetron  (ZOFRAN ) 4 MG tablet Take 1 tablet (4 mg total) by mouth every 8 (eight) hours as needed for nausea or vomiting. 06/14/24  Yes Elnor Savant A, DO  oxyCODONE  (ROXICODONE ) 5 MG immediate release tablet Take 1 tablet (5 mg total) by mouth every 4 (four) hours as needed for severe pain (pain score 7-10). 06/14/24  Yes Elnor Savant A, DO  phenazopyridine (PYRIDIUM) 200 MG tablet Take 1 tablet (200 mg total) by mouth 3 (three) times daily. 06/14/24  Yes Elnor Savant LABOR, DO  aspirin  EC 81 MG tablet Take 1 tablet (81 mg total) by mouth daily. Swallow whole. 10/22/23   Shahmehdi, Adriana LABOR, MD  baclofen  (LIORESAL ) 10 MG tablet Take 1 tablet (10 mg total) by mouth 3 (three) times daily. 05/10/24   Lavell Lye A, FNP  diclofenac  Sodium (VOLTAREN ) 1 % GEL Apply 2 g topically 4 (four) times daily. Patient not taking: Reported on 06/14/2024 11/07/23   Cathlene Marry Lenis, FNP  EPINEPHrine  0.3 mg/0.3 mL IJ SOAJ injection Inject 0.3 mg into the muscle as needed for anaphylaxis. 03/10/23   Zelaya, Oscar A, PA-C  EPINEPHrine  0.3 mg/0.3 mL IJ SOAJ injection Inject 0.3 mg into the muscle as needed for anaphylaxis. 04/20/24   Keith, Kayla N, PA-C  famotidine  (PEPCID ) 40 MG tablet Take 1 tablet (40 mg  total) by mouth 2 (two) times daily as needed for heartburn/reflux. 05/10/24   Lavell Lye A, FNP  levocetirizine (XYZAL ) 5 MG tablet Take 1 tablet (5 mg total) by mouth every evening. 11/17/23   Lavell Lye LABOR, FNP  linaclotide  (LINZESS ) 72 MCG capsule Take 72 mcg by mouth daily before breakfast.    [provider]  lisdexamfetamine  (VYVANSE ) 50 MG capsule Take 1 capsule (50 mg total) by mouth daily before breakfast. 05/10/24 06/14/24  Lavell Lye LABOR, FNP  lisdexamfetamine  (VYVANSE )  50 MG capsule Take 1 capsule (50 mg total) by mouth daily before breakfast. 06/09/24 07/09/24  Lavell Lye LABOR, FNP  lisdexamfetamine  (VYVANSE ) 50 MG capsule Take 1 capsule (50 mg total) by mouth daily before breakfast. 07/09/24 08/08/24  Lavell Lye A, FNP  montelukast  (SINGULAIR ) 10 MG tablet Take 1 tablet (10 mg total) by mouth at bedtime. 11/28/23   Lavell Lye LABOR, FNP  Na Sulfate-K Sulfate-Mg Sulfate concentrate (SUPREP) 17.5-3.13-1.6 GM/177ML SOLN Take 1 kit (354 mLs total) by mouth once for 1 dose. May use generic Suprep, no Prior Authorization; Use Singlecare or Good RX. 06/14/24 06/14/24  Armbruster, Elspeth SQUIBB, MD  olmesartan (BENICAR) 5 MG tablet Take 5 mg by mouth. 03/17/24   [provider]  omeprazole  (PRILOSEC) 20 MG capsule Take 1 capsule (20 mg total) by mouth daily. 02/10/24   Lavell Lye LABOR, FNP  UNABLE TO FIND Asparagus drops-daily in water; Liver Life-10 drops daily Patient not taking: Reported on 06/14/2024    [provider]  venlafaxine  XR (EFFEXOR -XR) 37.5 MG 24 hr capsule Take 1 capsule (37.5 mg total) by mouth daily with breakfast. 02/23/24   Lavell Lye LABOR, FNP                                                                                                                                    Past Surgical History Past Surgical History:  Procedure Laterality Date   CHOLECYSTECTOMY N/A 11/08/2021   Procedure: LAPAROSCOPIC CHOLECYSTECTOMY;  Surgeon: Evonnie Dorothyann LABOR, DO;  Location: [facility];  Service: General;  Laterality: N/A;   COLONOSCOPY  2016   2016 no polyps. R[facility] 2022 per pt, normal, no record available   ENDOMETRIAL ABLATION     SHOULDER SURGERY Left    Approximately 2016   TRANSTHORACIC ECHOCARDIOGRAM     01/2021 NORMAL   TUBAL LIGATION     UPPER GASTROINTESTINAL ENDOSCOPY  11/10/2019   neg   Family History Family History  Problem Relation Age of Onset   Peripheral Artery Disease Mother    Stroke Mother    Other Father         Hx unknown to patient   Testicular cancer Brother    Esophageal cancer Brother    Early death Brother    Hypotension Maternal Grandmother    Aortic dissection Maternal Grandmother    Heart disease Maternal Grandfather    Diabetes Paternal Grandmother  AODM   Diabetes Paternal Grandfather        AODM   Healthy Daughter    Healthy Son    Ovarian cancer Paternal Great-grandmother    Breast cancer Neg Hx    Colon cancer Neg Hx    Colon polyps Neg Hx    Rectal cancer Neg Hx    Stomach cancer Neg Hx     Social History Social History[1] Allergies Bee venom, Flagyl  [metronidazole ], Iodine, Shellfish allergy, Tramadol  hcl, Wasp venom, Lactose intolerance (gi), Gluten meal, and Melatonin  Review of Systems A thorough review of systems was obtained and all systems are negative except as noted in the HPI and PMH.   Physical Exam Vital Signs  I have reviewed the triage vital signs BP 125/70 (BP Location: Right Arm)   Pulse 100   Temp 98.6 F (37 C) (Oral)   Resp 19   SpO2 98%  Physical Exam Vitals and nursing note reviewed.  Constitutional:      General: She is not in acute distress.    Appearance: Normal appearance. She is well-developed. She is not ill-appearing.  HENT:     Head: Normocephalic and atraumatic.     Right Ear: External ear normal.     Left Ear: External ear normal.     Nose: Nose normal.     Mouth/Throat:     Mouth: Mucous membranes are moist.  Eyes:     General: No scleral icterus.       Right eye: No discharge.        Left eye: No discharge.  Cardiovascular:     Rate and Rhythm: Normal rate.  Pulmonary:     Effort: Pulmonary effort is normal. No respiratory distress.     Breath sounds: No stridor.  Abdominal:     General: Abdomen is flat. There is no distension.     Palpations: Abdomen is soft.     Tenderness: There is abdominal tenderness. There is left CVA tenderness. There is no guarding.      Comments: Not peritoneal  Musculoskeletal:         General: No deformity.     Cervical back: No rigidity.  Skin:    General: Skin is warm and dry.     Coloration: Skin is not cyanotic, jaundiced or pale.  Neurological:     Mental Status: She is alert.  Psychiatric:        Speech: Speech normal.        Behavior: Behavior normal. Behavior is cooperative.     ED Results and Treatments Labs (all labs ordered are listed, but only abnormal results are displayed) Labs Reviewed  URINALYSIS, ROUTINE W REFLEX MICROSCOPIC - Abnormal; Notable for the following components:      Result Value   Color, Urine AMBER (*)    APPearance CLOUDY (*)    Hgb urine dipstick MODERATE (*)    Protein, ur 100 (*)    Nitrite POSITIVE (*)    Leukocytes,Ua MODERATE (*)    Bacteria, UA FEW (*)    Non Squamous Epithelial 0-5 (*)    All other components within normal limits  BASIC METABOLIC PANEL WITH GFR - Abnormal; Notable for the following components:   Creatinine, Ser 1.08 (*)    GFR, Estimated 59 (*)    All other components within normal limits  CBC - Abnormal; Notable for the following components:   WBC 10.6 (*)    All other components within normal limits  URINE CULTURE  Radiology CT Renal Stone Study Result Date: 06/14/2024 EXAM: CT UROGRAM 06/14/2024 11:09:59 AM TECHNIQUE: CT of the abdomen and pelvis was performed without the administration of intravenous contrast as per CT urogram protocol. Multiplanar reformatted images as well as MIP urogram images are provided for review. Automated exposure control, iterative reconstruction, and/or weight based adjustment of the mA/kV was utilized to reduce the radiation dose to as low as reasonably achievable. COMPARISON: Renal ultrasound 11/03/2023. CLINICAL HISTORY: Left flank/back pain for 1 week with 4 days of urinary retention and history of renal calculi. FINDINGS: LOWER CHEST: Mild  scarring or subsegmental atelectasis in the left lower lobe. LIVER: The liver is unremarkable. GALLBLADDER AND BILE DUCTS: Cholecystectomy. No biliary ductal dilatation. SPLEEN: No acute abnormality. PANCREAS: No acute abnormality. ADRENAL GLANDS: No acute abnormality. KIDNEYS, URETERS AND BLADDER: Scattered chronic scarring in both kidneys. Equivocal wall thickening in the renal collecting systems and ureters. Wall thickening in the urinary bladder suspicious for cystitis although nondistention may also be playing a role. No urinary tract calculi currently identified. No hydronephrosis. No perinephric or periureteral stranding. No stents are identified. Correlate with urine analysis in assessing for urinary tract infection. GI AND BOWEL: Sigmoid colon diverticulosis. Stomach demonstrates no acute abnormality. There is no bowel obstruction. PERITONEUM AND RETROPERITONEUM: No ascites. No free air. VASCULATURE: Aorta is normal in caliber. LYMPH NODES: No lymphadenopathy. REPRODUCTIVE ORGANS: No acute abnormality. BONES AND SOFT TISSUES: Degenerative subcortical cystic lesion anteriorly in the right femoral head. No acute osseous abnormality. No focal soft tissue abnormality. IMPRESSION: 1. Urinary bladder wall thickening suspicious for cystitis. 2. Equivocal wall thickening of the renal collecting systems and ureters. 3. No nephrolithiasis or obstructive uropathy. 4. Chronic and incidental findings including right femoral head degenerative subcortical cystic change, scattered chronic bilateral renal scarring, sigmoid diverticulosis, prior cholecystectomy, and mild left lower lobe subsegmental atelectasis or scarring. Electronically signed by: Ryan Salvage MD 06/14/2024 11:53 AM EST RP Workstation: [id number]    Pertinent labs & imaging results that were available during my care of the patient were reviewed by me and considered in my medical decision making (see MDM for details).  Medications Ordered in  ED Medications  cefTRIAXone  (ROCEPHIN ) 1 g in sodium chloride  0.9 % 100 mL IVPB (1 g Intravenous New Bag/Given 06/14/24 1430)  sodium chloride  0.9 % bolus 1,000 mL (1,000 mLs Intravenous New Bag/Given 06/14/24 1430)  ketorolac  (TORADOL ) 15 MG/ML injection 15 mg (15 mg Intravenous Given 06/14/24 1429)  ondansetron  (ZOFRAN ) injection 4 mg (4 mg Intravenous Given 06/14/24 1429)  morphine  (PF) 2 MG/ML injection 2 mg (2 mg Intravenous Given 06/14/24 1429)                                                                                                                                     Procedures Procedures  (including critical care time)  Medical Decision Making / ED Course    Medical Decision Making:  Candace Allen is a 58 y.o. female with past medical history as below, significant for CKD 3, diverticulosis, GERD, hypothyroid who presents to the ED with complaint of abdominal pain, nausea, dysuria. The complaint involves an extensive differential diagnosis and also carries with it a high risk of complications and morbidity.  Serious etiology was considered. Ddx includes but is not limited to: Differential diagnosis includes but is not exclusive to ectopic pregnancy, ovarian cyst, ovarian torsion, acute appendicitis, urinary tract infection, endometriosis, bowel obstruction, hernia, colitis, renal colic, gastroenteritis, volvulus etc.   Complete initial physical exam performed, notably the patient was in no acute distress, resting comfortably.    Reviewed and confirmed nursing documentation for past medical history, family history, social history.  Vital signs reviewed.    Left-sided flank pain Pyelonephritis> - Left sided CVA tenderness, nausea, subjective fever.  No fever here, HDS here. - Slight leukocytosis 10.6.  Creatinine similar to baseline.  UA concerning for infection.  Send urine culture. Not septic.  Give Rocephin .  Concern for pyelonephritis. - She is feeling much better,  tolerant p.o. difficulty. - Reviewed prior urine culture, reviewed allergy list - Start antibiotics for home, analgesia and antiemetic for home.  Encouraged rehydration, rest, close PCP follow-up   Clinical Course as of 06/14/24 1451  Mon Jun 14, 2024  1441 Symptoms have improved [SG]    Clinical Course User Index [SG] Elnor Jayson LABOR, DO     2:51 PM:  I have discussed the diagnosis/risks/treatment options with the patient and spouse.  Evaluation and diagnostic testing in the emergency department does not suggest an emergent condition requiring admission or immediate intervention beyond what has been performed at this time.  They will follow up with PCP. We also discussed returning to the ED immediately if new or worsening sx occur. We discussed the sx which are most concerning (e.g., sudden worsening pain, fever, inability to tolerate by mouth ) that necessitate immediate return.    The patient appears reasonably screened and/or stabilized for discharge and I doubt any other medical condition or other [facility] requiring further screening, evaluation, or treatment in the ED at this time prior to discharge.                 Additional history obtained: -Additional history obtained from spouse -External records from outside source obtained and reviewed including: Chart review including previous notes, labs, imaging, consultation notes including  Prior cultures, allergy list   Lab Tests: -I ordered, reviewed, and interpreted labs.   The pertinent results include:   Labs Reviewed  URINALYSIS, ROUTINE W REFLEX MICROSCOPIC - Abnormal; Notable for the following components:      Result Value   Color, Urine AMBER (*)    APPearance CLOUDY (*)    Hgb urine dipstick MODERATE (*)    Protein, ur 100 (*)    Nitrite POSITIVE (*)    Leukocytes,Ua MODERATE (*)    Bacteria, UA FEW (*)    Non Squamous Epithelial 0-5 (*)    All other components within normal limits  BASIC METABOLIC PANEL  WITH GFR - Abnormal; Notable for the following components:   Creatinine, Ser 1.08 (*)    GFR, Estimated 59 (*)    All other components within normal limits  CBC - Abnormal; Notable for the following components:   WBC 10.6 (*)    All other components within normal limits  URINE CULTURE    Notable for as above  EKG   EKG Interpretation Date/Time:  Ventricular Rate:    PR Interval:    QRS Duration:    QT Interval:    QTC Calculation:   R Axis:      Text Interpretation:           Imaging Studies ordered: I ordered imaging studies including CT renal I independently visualized the following imaging with scope of interpretation limited to determining acute life threatening conditions related to emergency care; findings noted above I agree with the radiologist interpretation If any imaging was obtained with contrast I closely monitored patient for any possible adverse reaction a/w contrast administration in the emergency department   Medicines ordered and prescription drug management: Meds ordered this encounter  Medications   cefTRIAXone  (ROCEPHIN ) 1 g in sodium chloride  0.9 % 100 mL IVPB    Antibiotic Indication::   UTI   sodium chloride  0.9 % bolus 1,000 mL   ketorolac  (TORADOL ) 15 MG/ML injection 15 mg   ondansetron  (ZOFRAN ) injection 4 mg   morphine  (PF) 2 MG/ML injection 2 mg   cefdinir (OMNICEF) 300 MG capsule    Sig: Take 1 capsule (300 mg total) by mouth 2 (two) times daily for 14 days.    Dispense:  28 capsule    Refill:  0   oxyCODONE  (ROXICODONE ) 5 MG immediate release tablet    Sig: Take 1 tablet (5 mg total) by mouth every 4 (four) hours as needed for severe pain (pain score 7-10).    Dispense:  10 tablet    Refill:  0   acetaminophen  (TYLENOL ) 325 MG tablet    Sig: Take 2 tablets (650 mg total) by mouth every 6 (six) hours as needed.    Dispense:  36 tablet    Refill:  0   phenazopyridine (PYRIDIUM) 200 MG tablet    Sig: Take 1 tablet (200 mg total)  by mouth 3 (three) times daily.    Dispense:  6 tablet    Refill:  0   ondansetron  (ZOFRAN ) 4 MG tablet    Sig: Take 1 tablet (4 mg total) by mouth every 8 (eight) hours as needed for nausea or vomiting.    Dispense:  6 tablet    Refill:  0    -I have reviewed the patients home medicines and have made adjustments as needed   Consultations Obtained: Not applicable  Cardiac Monitoring: Continuous pulse oximetry interpreted by myself, 99% on room air.    Social Determinants of Health:  Diagnosis or treatment significantly limited by social determinants of health: Not applicable   Reevaluation: After the interventions noted above, I reevaluated the patient and found that they have improved  Co morbidities that complicate the patient evaluation  Past Medical History:  Diagnosis Date   Allergy    Attention deficit disorder (ADD) in adult    Chronic renal insufficiency, stage 3 (moderate)    ?d/t dexilant   Diverticulosis 02/19/2018   By colonoscopy 2016   Endometrial cancer (HCC)    GERD (gastroesophageal reflux disease)    Hx of concussion    Approximately 2016   Hypothyroidism    question of-->no hx of thyroid  lab abnormalities in record   Hypothyroidism (acquired) 08/20/2019   IBS (irritable bowel syndrome)    TIA (transient ischemic attack) 10/20/2020      Dispostion: Disposition decision including need for hospitalization was considered, and patient discharged from emergency department.    Final Clinical Impression(s) / ED Diagnoses Final diagnoses:  Pyelonephritis  Left flank pain         [  1]  Social History Tobacco Use   Smoking status: Never    Passive exposure: Never   Smokeless tobacco: Never  Vaping Use   Vaping status: Never Used  Substance Use Topics   Alcohol use: Yes    Alcohol/week: 2.0 standard drinks of alcohol    Types: 2 Standard drinks or equivalent per week    Comment: social   Drug use: Never     Elnor Jayson LABOR,  DO 06/14/24 1451  "

## 2024-06-14 NOTE — Progress Notes (Signed)

## 2024-06-14 NOTE — Discharge Instructions (Signed)
 It was a pleasure caring for you today in the emergency department.  Be sure to get plenty of rest over the next few days, drink plenty of liquids.  Symptoms should start improving over the next 24 hours.  Please follow-up with your PCP in the next week for recheck.  Please return to the emergency department for any worsening or worrisome symptoms.

## 2024-06-14 NOTE — Telephone Encounter (Signed)
 FYI Only or Action Required?: FYI only for provider: ED advised.  Patient was last seen in primary care on 05/10/2024 by Lavell Bari LABOR, FNP.  Called Nurse Triage reporting Dysuria.  Symptoms began several days ago.  Interventions attempted: Rest, hydration, or home remedies.  Symptoms are: rapidly worsening.  Triage Disposition: Go to ED Now (Notify PCP)  Patient/caregiver understands and will follow disposition?: Yes Reason for Disposition  [1] Unable to urinate (or only a few drops) > 4 hours AND [2] bladder feels very full (e.g., palpable bladder or strong urge to urinate)  Answer Assessment - Initial Assessment Questions Dysuria with severe flank pain, chills and minimal urine output x 3 days. Going to ED.   1. SEVERITY: How bad is the pain?  (e.g., Scale 1-10; mild, moderate, or severe)     12/10 3. PATTERN: Is pain present every time you urinate or just sometimes?      Constant 4. ONSET: When did the painful urination start?      06/12/24 5. FEVER: Do you have a fever? If Yes, ask: What is your temperature, how was it measured, and when did it start?     Unknown, but cannot get warm 7. CAUSE: What do you think is causing the painful urination?  (e.g., UTI, scratch, Herpes sore)     Has had a kidney stone before 8. OTHER SYMPTOMS: Do you have any other symptoms? (e.g., blood in urine, flank pain, genital sores, urgency, vaginal discharge)     Severe flank pain  Protocols used: Urination Pain - Female-A-AH Copied from CRM #[id number]. Topic: Clinical - Red Word Triage >> Jun 14, 2024  9:10 AM Diannia H wrote: [facility] that prompted transfer to Nurse Triage: Patient thinks she thinks she has a UTI and is in extreme pain. Pain 1-10 is a 12. She is shaking, and it hurts really bad when she uses the restroom.   Callback number is [phone] just in case phone is disconnected. Past Medical History:  Diagnosis Date   Allergy    Attention deficit disorder  (ADD) in adult    Chronic renal insufficiency, stage 3 (moderate)    ?d/t dexilant   Diverticulosis 02/19/2018   By colonoscopy 2016   Endometrial cancer (HCC)    GERD (gastroesophageal reflux disease)    Hx of concussion    Approximately 2016   Hypothyroidism    question of-->no hx of thyroid  lab abnormalities in record   Hypothyroidism (acquired) 08/20/2019   IBS (irritable bowel syndrome)    TIA (transient ischemic attack) 10/20/2020

## 2024-06-15 ENCOUNTER — Ambulatory Visit

## 2024-06-16 ENCOUNTER — Ambulatory Visit: Payer: Self-pay

## 2024-06-16 NOTE — Telephone Encounter (Signed)
 FYI Only or Action Required?: Action required by provider: clinical question for provider.  Patient was last seen in primary care on 05/10/2024 by Lavell Bari LABOR, FNP.  Called Nurse Triage reporting Flank Pain.  Symptoms began yesterday.  Interventions attempted: Prescription medications:  SABRA  Symptoms are: gradually worsening. Seen in ED 06/14/24 with pyelonephritis. Oxycodone  is causing headache.I can't take that medicine anymore. Refuses to return to ED. Declines appointment now for follow up. States she will call [facility] for advice.  Triage Disposition: Go to ED Now (Notify PCP)  Patient/caregiver understands and will follow disposition?: No, wishes to speak with PCP     Copied from CRM #[id number]. Topic: Clinical - Red Word Triage >> Jun 16, 2024 12:05 PM Candace Allen wrote: Red Word that prompted transfer to Nurse Triage: Patient states she was at [facility] Monday and was giving Oxy but it does not seem to be helping. Patient states she is having extremely painful headaches, extremely pain from her UTI, and sharp pains from her left flank in her back up to her left side of her chest. Reason for Disposition  [1] SEVERE pain (e.g., excruciating, scale 8-10) AND [2] not improved after pain medicine  Answer Assessment - Initial Assessment Questions 1. LOCATION: Where does it hurt? (e.g., left, right)     left 2. ONSET: When did the pain start?     06/14/24 3. SEVERITY: How bad is the pain? (e.g., Scale 1-10; mild, moderate, or severe)     Severe oxycodone  causes headache 4. PATTERN: Does the pain come and go, or is it constant?      constant 5. CAUSE: What do you think is causing the pain?     Kidney infection 6. OTHER SYMPTOMS:  Do you have any other symptoms? (e.g., fever, abdomen pain, vomiting, leg weakness, burning with urination, blood in urine)     nausea 7. PREGNANCY:  Is there any chance you are pregnant? When was your last menstrual period?      no  Protocols used: Flank Pain-A-AH

## 2024-06-20 LAB — URINE CULTURE: Culture: >=100000 — AB

## 2024-06-21 ENCOUNTER — Telehealth (HOSPITAL_BASED_OUTPATIENT_CLINIC_OR_DEPARTMENT_OTHER): Payer: Self-pay | Admitting: *Deleted

## 2024-06-21 NOTE — Telephone Encounter (Signed)
 Post ED Visit - Positive Culture Follow-up: Unsuccessful Patient Follow-up  Culture assessed and recommendations reviewed by:  [x]  Elma Heinz, Pharm.D. []  Venetia Gully, [address].D., BCPS AQ-ID []  Garrel Crews, Pharm.D., BCPS []  Almarie Lunger, Pharm.D., BCPS []  Lupus, [address].D., BCPS, AAHIVP []  Rosaline Bihari, Pharm.D., BCPS, AAHIVP []  Massie Rigg, PharmD []  Jodie Rower, PharmD, BCPS  Positive urine culture Caby Dedour PA-C Plan: stop Cefdinir  and take Cefuroxime 500 mg  BID for 7 days  []  Patient discharged without antimicrobial prescription and treatment is now indicated [x]  Organism is resistant to prescribed ED discharge antimicrobial []  Patient with positive blood cultures   Unable to contact patient after 3 attempts, letter will be sent to address on file  Jama Wyman Kipper 06/21/2024, 10:20 AM

## 2024-06-21 NOTE — Progress Notes (Signed)
 ED Antimicrobial Stewardship Positive Culture Follow Up   Candace Allen is an 59 y.o. female who presented to [facility] with a chief complaint of  Chief Complaint  Patient presents with   Flank Pain    Recent Results (from the past 720 hours)  Urine Culture     Status: Abnormal   Collection Time: 06/14/24 10:36 AM   Specimen: Urine, Clean Catch  Result Value Ref Range Status   Specimen Description URINE, CLEAN CATCH  Final   Special Requests NONE  Final   Culture >=100,000 COLONIES/mL ESCHERICHIA COLI (A)  Final   Report Status 06/20/2024 FINAL  Final   Organism ID, Bacteria ESCHERICHIA COLI (A)  Final      Susceptibility   Escherichia coli - MIC*    AMPICILLIN <=2 SENSITIVE Sensitive     CEFAZOLIN (URINE) Value in next row Sensitive      <=1 SENSITIVEThis is a modified FDA-approved test that has been validated and its performance characteristics determined by the reporting laboratory.  This laboratory is certified under the Clinical Laboratory Improvement Amendments CLIA as qualified to perform high complexity clinical laboratory testing.    CEFEPIME Value in next row Sensitive      <=1 SENSITIVEThis is a modified FDA-approved test that has been validated and its performance characteristics determined by the reporting laboratory.  This laboratory is certified under the Clinical Laboratory Improvement Amendments CLIA as qualified to perform high complexity clinical laboratory testing.    ERTAPENEM Value in next row Sensitive      <=1 SENSITIVEThis is a modified FDA-approved test that has been validated and its performance characteristics determined by the reporting laboratory.  This laboratory is certified under the Clinical Laboratory Improvement Amendments CLIA as qualified to perform high complexity clinical laboratory testing.    CEFTRIAXONE  Value in next row Sensitive      <=1 SENSITIVEThis is a modified FDA-approved test that has been validated and its performance characteristics  determined by the reporting laboratory.  This laboratory is certified under the Clinical Laboratory Improvement Amendments CLIA as qualified to perform high complexity clinical laboratory testing.    CIPROFLOXACIN  Value in next row Sensitive      <=1 SENSITIVEThis is a modified FDA-approved test that has been validated and its performance characteristics determined by the reporting laboratory.  This laboratory is certified under the Clinical Laboratory Improvement Amendments CLIA as qualified to perform high complexity clinical laboratory testing.    GENTAMICIN Value in next row Sensitive      <=1 SENSITIVEThis is a modified FDA-approved test that has been validated and its performance characteristics determined by the reporting laboratory.  This laboratory is certified under the Clinical Laboratory Improvement Amendments CLIA as qualified to perform high complexity clinical laboratory testing.    NITROFURANTOIN Value in next row Sensitive      <=1 SENSITIVEThis is a modified FDA-approved test that has been validated and its performance characteristics determined by the reporting laboratory.  This laboratory is certified under the Clinical Laboratory Improvement Amendments CLIA as qualified to perform high complexity clinical laboratory testing.    TRIMETH/SULFA Value in next row Sensitive      <=1 SENSITIVEThis is a modified FDA-approved test that has been validated and its performance characteristics determined by the reporting laboratory.  This laboratory is certified under the Clinical Laboratory Improvement Amendments CLIA as qualified to perform high complexity clinical laboratory testing.    AMPICILLIN/SULBACTAM Value in next row Sensitive      <=1 SENSITIVEThis is a  modified FDA-approved test that has been validated and its performance characteristics determined by the reporting laboratory.  This laboratory is certified under the Clinical Laboratory Improvement Amendments CLIA as qualified to  perform high complexity clinical laboratory testing.    PIP/TAZO Value in next row Sensitive      <=4 SENSITIVEThis is a modified FDA-approved test that has been validated and its performance characteristics determined by the reporting laboratory.  This laboratory is certified under the Clinical Laboratory Improvement Amendments CLIA as qualified to perform high complexity clinical laboratory testing.    MEROPENEM Value in next row Sensitive      <=4 SENSITIVEThis is a modified FDA-approved test that has been validated and its performance characteristics determined by the reporting laboratory.  This laboratory is certified under the Clinical Laboratory Improvement Amendments CLIA as qualified to perform high complexity clinical laboratory testing.    * >=100,000 COLONIES/mL ESCHERICHIA COLI    Treated with cefdinir , organism resistant to prescribed antimicrobial  New antibiotic prescription: cefuroxime 500mg  PO BID x7days  ED Provider: Gaby DuFour, PA   [name] 06/21/2024, 8:18 AM Clinical Pharmacist Monday - Friday phone -  [phone] Saturday - Sunday phone - [phone]

## 2024-06-23 ENCOUNTER — Other Ambulatory Visit: Payer: Self-pay | Admitting: Family

## 2024-06-23 DIAGNOSIS — K219 Gastro-esophageal reflux disease without esophagitis: Secondary | ICD-10-CM

## 2024-06-29 ENCOUNTER — Telehealth: Payer: Self-pay

## 2024-06-29 DIAGNOSIS — T782XXA Anaphylactic shock, unspecified, initial encounter: Secondary | ICD-10-CM

## 2024-06-29 NOTE — Progress Notes (Signed)
 Complex Care Management Note Care Guide Note  06/29/2024 Name: Candace Allen MRN: [id number] DOB: 11-29-1965   Complex Care Management Outreach Attempts: An unsuccessful telephone outreach was attempted today to offer the patient information about available complex care management services.  Follow Up Plan:  Additional outreach attempts will be made to offer the patient complex care management information and services.   Encounter Outcome:  No Answer  [name]  [facility] [place], [facility] VBCI Assistant Direct Dial: [phone]  Fax: [phone]

## 2024-06-30 ENCOUNTER — Encounter: Payer: Self-pay | Admitting: Gastroenterology

## 2024-06-30 ENCOUNTER — Ambulatory Visit (AMBULATORY_SURGERY_CENTER): Admitting: Gastroenterology

## 2024-06-30 VITALS — BP 145/92 | HR 71 | Temp 97.3°F | Resp 12 | Ht 70.0 in | Wt 163.0 lb

## 2024-06-30 DIAGNOSIS — K5730 Diverticulosis of large intestine without perforation or abscess without bleeding: Secondary | ICD-10-CM

## 2024-06-30 DIAGNOSIS — Z1211 Encounter for screening for malignant neoplasm of colon: Secondary | ICD-10-CM

## 2024-06-30 DIAGNOSIS — Q439 Congenital malformation of intestine, unspecified: Secondary | ICD-10-CM

## 2024-06-30 MED ORDER — SODIUM CHLORIDE 0.9 % IV SOLN: 500.0000 mL | Freq: Once | INTRAVENOUS | Status: DC

## 2024-06-30 NOTE — Progress Notes (Signed)
 Report given to PACU, vss

## 2024-06-30 NOTE — Progress Notes (Signed)
 [facility] History and Physical   Primary Care Physician:  Lavell Bari LABOR, FNP   Reason for Procedure:   Colon cancer screening  Plan:    colonoscopy     HPI: Candace Allen is a 59 y.o. female  here for colonoscopy screening - last exam 2016 without polyps reported. Patient denies any new bowel symptoms at this time - she has chronic mixed type IBS. No family history of colon cancer known. Otherwise feels well without any cardiopulmonary symptoms.   I have discussed risks / benefits of anesthesia and endoscopic procedure with Olam Arlean Heinz and they wish to proceed with the exams as outlined today.   The patient was provided an opportunity to ask questions and all were answered. The patient agreed with the plan.    Past Medical History:  Diagnosis Date   Allergy    Attention deficit disorder (ADD) in adult    Chronic renal insufficiency, stage 3 (moderate)    ?d/t dexilant   Diverticulosis 02/19/2018   By colonoscopy 2016   Endometrial cancer (HCC)    GERD (gastroesophageal reflux disease)    Hx of concussion    Approximately 2016   Hypothyroidism    question of-->no hx of thyroid  lab abnormalities in record   Hypothyroidism (acquired) 08/20/2019   IBS (irritable bowel syndrome)    TIA (transient ischemic attack) 10/20/2020    Past Surgical History:  Procedure Laterality Date   CHOLECYSTECTOMY N/A 11/08/2021   Procedure: LAPAROSCOPIC CHOLECYSTECTOMY;  Surgeon: Evonnie Dorothyann LABOR, DO;  Location: [facility];  Service: General;  Laterality: N/A;   COLONOSCOPY  2016   2016 no polyps. Rpt at [facility] 2022 per pt, normal, no record available   ENDOMETRIAL ABLATION     SHOULDER SURGERY Left    Approximately 2016   TRANSTHORACIC ECHOCARDIOGRAM     01/2021 NORMAL   TUBAL LIGATION     UPPER GASTROINTESTINAL ENDOSCOPY  11/10/2019   neg    Prior to Admission medications  Medication Sig Start Date End Date Taking? Authorizing Provider  acetaminophen  (TYLENOL )  325 MG tablet Take 2 tablets (650 mg total) by mouth every 6 (six) hours as needed. 06/14/24  Yes Elnor Jayson LABOR, DO  aspirin  EC 81 MG tablet Take 1 tablet (81 mg total) by mouth daily. Swallow whole. 10/22/23  Yes Shahmehdi, Adriana LABOR, MD  famotidine  (PEPCID ) 40 MG tablet Take 1 tablet (40 mg total) by mouth 2 (two) times daily as needed for heartburn/reflux. 05/10/24  Yes Hawks, Christy A, FNP  olmesartan (BENICAR) 5 MG tablet Take 5 mg by mouth. 03/17/24  Yes [provider]  venlafaxine  XR (EFFEXOR -XR) 37.5 MG 24 hr capsule Take 1 capsule (37.5 mg total) by mouth daily with breakfast. 02/23/24  Yes Hawks, Christy A, FNP  baclofen  (LIORESAL ) 10 MG tablet Take 1 tablet (10 mg total) by mouth 3 (three) times daily. 05/10/24   Lavell Bari A, FNP  diclofenac  Sodium (VOLTAREN ) 1 % GEL Apply 2 g topically 4 (four) times daily. Patient not taking: No sig reported 11/07/23   Cathlene Marry Lenis, FNP  EPINEPHrine  0.3 mg/0.3 mL IJ SOAJ injection Inject 0.3 mg into the muscle as needed for anaphylaxis. 03/10/23   Zelaya, Oscar A, PA-C  EPINEPHrine  0.3 mg/0.3 mL IJ SOAJ injection Inject 0.3 mg into the muscle as needed for anaphylaxis. 04/20/24   Keith, Kayla N, PA-C  levocetirizine (XYZAL ) 5 MG tablet Take 1 tablet (5 mg total) by mouth every evening. 11/17/23   Lavell Bari  A, FNP  linaclotide  (LINZESS ) 72 MCG capsule Take 72 mcg by mouth daily before breakfast.    [provider]  lisdexamfetamine  (VYVANSE ) 50 MG capsule Take 1 capsule (50 mg total) by mouth daily before breakfast. 05/10/24 06/14/24  Lavell Bari LABOR, FNP  lisdexamfetamine  (VYVANSE ) 50 MG capsule Take 1 capsule (50 mg total) by mouth daily before breakfast. 06/09/24 07/09/24  Lavell Bari LABOR, FNP  lisdexamfetamine  (VYVANSE ) 50 MG capsule Take 1 capsule (50 mg total) by mouth daily before breakfast. 07/09/24 08/08/24  Lavell Bari A, FNP  montelukast  (SINGULAIR ) 10 MG tablet Take 1 tablet (10 mg total) by mouth at bedtime.  11/28/23   Lavell Bari LABOR, FNP  omeprazole  (PRILOSEC) 20 MG capsule TAKE 1 CAPSULE(20 MG) BY MOUTH DAILY 06/23/24   Lavell Bari A, FNP  ondansetron  (ZOFRAN ) 4 MG tablet Take 1 tablet (4 mg total) by mouth every 8 (eight) hours as needed for nausea or vomiting. 06/14/24   Elnor Jayson LABOR, DO  oxyCODONE  (ROXICODONE ) 5 MG immediate release tablet Take 1 tablet (5 mg total) by mouth every 4 (four) hours as needed for severe pain (pain score 7-10). 06/14/24   Elnor Jayson LABOR, DO  phenazopyridine  (PYRIDIUM ) 200 MG tablet Take 1 tablet (200 mg total) by mouth 3 (three) times daily. 06/14/24   Elnor Jayson LABOR, DO  UNABLE TO FIND Asparagus drops-daily in water; Liver Life-10 drops daily Patient not taking: No sig reported    [provider]    Current Outpatient Medications  Medication Sig Dispense Refill   acetaminophen  (TYLENOL ) 325 MG tablet Take 2 tablets (650 mg total) by mouth every 6 (six) hours as needed. 36 tablet 0   aspirin  EC 81 MG tablet Take 1 tablet (81 mg total) by mouth daily. Swallow whole. 30 tablet 12   famotidine  (PEPCID ) 40 MG tablet Take 1 tablet (40 mg total) by mouth 2 (two) times daily as needed for heartburn/reflux. 180 tablet 2   olmesartan (BENICAR) 5 MG tablet Take 5 mg by mouth.     venlafaxine  XR (EFFEXOR -XR) 37.5 MG 24 hr capsule Take 1 capsule (37.5 mg total) by mouth daily with breakfast. 90 capsule 1   baclofen  (LIORESAL ) 10 MG tablet Take 1 tablet (10 mg total) by mouth 3 (three) times daily. 30 each 2   diclofenac  Sodium (VOLTAREN ) 1 % GEL Apply 2 g topically 4 (four) times daily. (Patient not taking: No sig reported) 2 g 0   EPINEPHrine  0.3 mg/0.3 mL IJ SOAJ injection Inject 0.3 mg into the muscle as needed for anaphylaxis. 1 each 0   EPINEPHrine  0.3 mg/0.3 mL IJ SOAJ injection Inject 0.3 mg into the muscle as needed for anaphylaxis. 1 each 0   levocetirizine (XYZAL ) 5 MG tablet Take 1 tablet (5 mg total) by mouth every evening. 90 tablet 2   linaclotide   (LINZESS ) 72 MCG capsule Take 72 mcg by mouth daily before breakfast.     lisdexamfetamine  (VYVANSE ) 50 MG capsule Take 1 capsule (50 mg total) by mouth daily before breakfast. 30 capsule 0   lisdexamfetamine  (VYVANSE ) 50 MG capsule Take 1 capsule (50 mg total) by mouth daily before breakfast. 30 capsule 0   [START ON 07/09/2024] lisdexamfetamine  (VYVANSE ) 50 MG capsule Take 1 capsule (50 mg total) by mouth daily before breakfast. 30 capsule 0   montelukast  (SINGULAIR ) 10 MG tablet Take 1 tablet (10 mg total) by mouth at bedtime. 90 tablet 3   omeprazole  (PRILOSEC) 20 MG capsule TAKE 1 CAPSULE(20 MG) BY  MOUTH DAILY 90 capsule 0   ondansetron  (ZOFRAN ) 4 MG tablet Take 1 tablet (4 mg total) by mouth every 8 (eight) hours as needed for nausea or vomiting. 6 tablet 0   oxyCODONE  (ROXICODONE ) 5 MG immediate release tablet Take 1 tablet (5 mg total) by mouth every 4 (four) hours as needed for severe pain (pain score 7-10). 10 tablet 0   phenazopyridine  (PYRIDIUM ) 200 MG tablet Take 1 tablet (200 mg total) by mouth 3 (three) times daily. 6 tablet 0   UNABLE TO FIND Asparagus drops-daily in water; Liver Life-10 drops daily (Patient not taking: No sig reported)     Current Facility-Administered Medications  Medication Dose Route Frequency Provider Last Rate Last Admin   0.9 %  sodium chloride  infusion  500 mL Intravenous Once Efrat Zuidema, Elspeth SQUIBB, MD        Allergies as of 06/30/2024 - Review Complete 06/30/2024  Allergen Reaction Noted   Bee venom Anaphylaxis 01/24/2021   Flagyl  [metronidazole ] Anaphylaxis 02/19/2018   Iodine Anaphylaxis 02/19/2018   Shellfish allergy Anaphylaxis 01/24/2021   Tramadol  hcl Other (See Comments) 02/24/2018   Wasp venom Anaphylaxis 11/12/2022   Lactose intolerance (gi) Hives 07/05/2020   Gluten meal Other (See Comments) 09/12/2020   Melatonin Other (See Comments) 10/31/2021    Family History  Problem Relation Age of Onset   Peripheral Artery Disease Mother     Stroke Mother    Other Father        Hx unknown to patient   Testicular cancer Brother    Esophageal cancer Brother    Early death Brother    Hypotension Maternal Grandmother    Aortic dissection Maternal Grandmother    Heart disease Maternal Grandfather    Diabetes Paternal Grandmother        AODM   Diabetes Paternal Grandfather        AODM   Healthy Daughter    Healthy Son    Ovarian cancer Paternal Great-grandmother    Breast cancer Neg Hx    Colon cancer Neg Hx    Colon polyps Neg Hx    Rectal cancer Neg Hx    Stomach cancer Neg Hx     Social History   Socioeconomic History   Marital status: Married    Spouse name: Not on file   Number of children: 2   Years of education: Not on file   Highest education level: Some college, no degree  Occupational History   Occupation: oceanographer  Tobacco Use   Smoking status: Never    Passive exposure: Never   Smokeless tobacco: Never  Vaping Use   Vaping status: Never Used  Substance and Sexual Activity   Alcohol use: Yes    Alcohol/week: 2.0 standard drinks of alcohol    Types: 2 Standard drinks or equivalent per week    Comment: social   Drug use: Never   Sexual activity: Yes    Birth control/protection: Surgical    Comment: tubal & ablation  Other Topics Concern   Not on file  Social History Narrative   Married, 2 children--living 2 story home w/ 18 steps   Educ: 12+.   Grew up in [place].  [place] area approximately 2016.   Occup: publisher->[employer]   No tob   Left hand   Social Drivers of Health   Tobacco Use: Low Risk (06/30/2024)   Patient History    Smoking Tobacco Use: Never    Smokeless Tobacco Use: Never    Passive Exposure:  Never  Financial Resource Strain: Low Risk (11/28/2023)   Overall Financial Resource Strain (CARDIA)    Difficulty of Paying Living Expenses: Not hard at all  Food Insecurity: No Food Insecurity (11/28/2023)   Epic    Worried About Programme Researcher, Broadcasting/film/video in the  Last Year: Never true    Ran Out of Food in the Last Year: Never true  Transportation Needs: No Transportation Needs (11/28/2023)   Epic    Lack of Transportation (Medical): No    Lack of Transportation (Non-Medical): No  Physical Activity: Sufficiently Active (11/28/2023)   Exercise Vital Sign    Days of Exercise per Week: 5 days    Minutes of Exercise per Session: 90 min  Stress: Stress Concern Present (11/28/2023)   [employer] of Occupational Health - Occupational Stress Questionnaire    Feeling of Stress: To some extent  Social Connections: Moderately Integrated (11/28/2023)   Social Connection and Isolation Panel    Frequency of Communication with Friends and Family: More than three times a week    Frequency of Social Gatherings with Friends and Family: Twice a week    Attends Religious Services: Patient declined    Database Administrator or Organizations: Yes    Attends Banker Meetings: Not on file    Marital Status: Married  Intimate Partner Violence: Not At Risk (10/21/2023)   Humiliation, Afraid, Rape, and Kick questionnaire    Fear of Current or Ex-Partner: No    Emotionally Abused: No    Physically Abused: No    Sexually Abused: No  Depression (PHQ2-9): Low Risk (05/10/2024)   Depression (PHQ2-9)    PHQ-2 Score: 0  Alcohol Screen: Low Risk (11/28/2023)   Alcohol Screen    Last Alcohol Screening Score (AUDIT): 2  Housing: Low Risk (11/28/2023)   Epic    Unable to Pay for Housing in the Last Year: No    Number of Times Moved in the Last Year: 0    Homeless in the Last Year: No  Utilities: Not At Risk (10/21/2023)   [facility] Utilities    Threatened with loss of utilities: No  Health Literacy: Not on file    Review of Systems: All other review of systems negative except as mentioned in the HPI.  Physical Exam: Vital signs BP 113/78   Pulse 87   Temp (!) 97.3 F (36.3 C)   Ht 5' 10 (1.778 m)   Wt 163 lb (73.9 kg)   SpO2 98%   BMI 23.39 kg/m    General:   Alert,  Well-developed, pleasant and cooperative in NAD Lungs:  Clear throughout to auscultation.   Heart:  Regular rate and rhythm Abdomen:  Soft, nontender and nondistended.   Neuro/Psych:  Alert and cooperative. Normal mood and affect. A and O x 3  Marcey Naval, MD [facility]

## 2024-06-30 NOTE — Patient Instructions (Signed)
Handout given on diverticulosis.  YOU HAD AN ENDOSCOPIC PROCEDURE TODAY AT [facility]:   Refer to the procedure report that was given to you for any specific questions about what was found during the examination.  If the procedure report does not answer your questions, please call your gastroenterologist to clarify.  If you requested that your care partner not be given the details of your procedure findings, then the procedure report has been included in a sealed envelope for you to review at your convenience later.  YOU SHOULD EXPECT: Some feelings of bloating in the abdomen. Passage of more gas than usual.  Walking can help get rid of the air that was put into your GI tract during the procedure and reduce the bloating. If you had a lower endoscopy (such as a colonoscopy or flexible sigmoidoscopy) you may notice spotting of blood in your stool or on the toilet paper. If you underwent a bowel prep for your procedure, you may not have a normal bowel movement for a few days.  Please Note:  You might notice some irritation and congestion in your nose or some drainage.  This is from the oxygen used during your procedure.  There is no need for concern and it should clear up in a day or so.  SYMPTOMS TO REPORT IMMEDIATELY:  Following lower endoscopy (colonoscopy or flexible sigmoidoscopy):  Excessive amounts of blood in the stool  Significant tenderness or worsening of abdominal pains  Swelling of the abdomen that is new, acute  Fever of 100F or higher  For urgent or emergent issues, a gastroenterologist can be reached at any hour by calling [phone]. Do not use MyChart messaging for urgent concerns.    DIET:  We do recommend a small meal at first, but then you may proceed to your regular diet.  Drink plenty of fluids but you should avoid alcoholic beverages for 24 hours.  ACTIVITY:  You should plan to take it easy for the rest of today and you should NOT DRIVE or use heavy  machinery until tomorrow (because of the sedation medicines used during the test).    FOLLOW UP: Our staff will call the number listed on your records the next business day following your procedure.  We will call around 7:15- 8:00 am to check on you and address any questions or concerns that you may have regarding the information given to you following your procedure. If we do not reach you, we will leave a message.     If any biopsies were taken you will be contacted by phone or by letter within the next 1-3 weeks.  Please call us at [phone] if you have not heard about the biopsies in 3 weeks.    SIGNATURES/CONFIDENTIALITY: You and/or your care partner have signed paperwork which will be entered into your electronic medical record.  These signatures attest to the fact that that the information above on your After Visit Summary has been reviewed and is understood.  Full responsibility of the confidentiality of this discharge information lies with you and/or your care-partner.

## 2024-06-30 NOTE — Op Note (Addendum)
 [facility] Patient Name: Candace Allen Procedure Date: 06/30/2024 6:57 AM MRN: [id number] Endoscopist: Elspeth P. Leigh , MD, [id number] Age: 59 Referring MD:  Date of Birth: 09/11/1965 Gender: Female Account #: [id number] Procedure:                Colonoscopy Indications:              Screening for colorectal malignant neoplasm - last                            exam 2016 reportedly normal Medicines:                Monitored Anesthesia Care Procedure:                Pre-Anesthesia Assessment:                           - Prior to the procedure, a History and Physical                            was performed, and patient medications and                            allergies were reviewed. The patient's tolerance of                            previous anesthesia was also reviewed. The risks                            and benefits of the procedure and the sedation                            options and risks were discussed with the patient.                            All questions were answered, and informed consent                            was obtained. Prior Anticoagulants: The patient has                            taken no anticoagulant or antiplatelet agents. ASA                            Grade Assessment: III - A patient with severe                            systemic disease. After reviewing the risks and                            benefits, the patient was deemed in satisfactory                            condition to undergo the procedure.  After obtaining informed consent, the colonoscope                            was passed under direct vision. Throughout the                            procedure, the patient's blood pressure, pulse, and                            oxygen saturations were monitored continuously. The                            Olympus Scope [id number] was introduced through the                            anus and advanced to the  the cecum, identified by                            appendiceal orifice and ileocecal valve. The                            colonoscopy was performed without difficulty. The                            patient tolerated the procedure well. The quality                            of the bowel preparation was adequate. The                            ileocecal valve, appendiceal orifice, and rectum                            were photographed. Scope In: 8:00:44 AM Scope Out: 8:23:45 AM Scope Withdrawal Time: 0 hours 14 minutes 32 seconds  Total Procedure Duration: 0 hours 23 minutes 1 second  Findings:                 The perianal and digital rectal examinations were                            normal.                           Many diverticula were found in the entire colon.                           The colon was extremely tortuous with some                            restricted mobility of the left colon. Pediatric                            colonoscope used for this exam.  The exam was otherwise without abnormality. Complications:            No immediate complications. Estimated blood loss:                            None. Estimated Blood Loss:     Estimated blood loss: none. Impression:               - Diverticulosis in the entire examined colon.                           - Tortuous colon.                           - The examination was otherwise normal.                           - No polyps. Recommendation:           - Patient has a contact number available for                            emergencies. The signs and symptoms of potential                            delayed complications were discussed with the                            patient. Return to normal activities tomorrow.                            Written discharge instructions were provided to the                            patient.                           - Resume previous diet.                            - Continue present medications.                           - Repeat colonoscopy in 10 years for screening                            purposes. Elspeth P. [name], MD 06/30/2024 8:30:44 AM This report has been signed electronically.

## 2024-06-30 NOTE — Progress Notes (Signed)
 9189 Patient experiencing nausea and retching.  MD updated and Zofran 4 mg IV given, vss

## 2024-06-30 NOTE — Progress Notes (Signed)
 Pt's states no medical or surgical changes since previsit or office visit.

## 2024-06-30 NOTE — Progress Notes (Signed)
 Pt rates abd pain 6/10.  Levsin given.  Pt is passing air.

## 2024-06-30 NOTE — Progress Notes (Signed)
 0830 Patient experiencing nausea.  MD updated and Zofran  4 mg IV given, vss

## 2024-07-01 ENCOUNTER — Telehealth (HOSPITAL_BASED_OUTPATIENT_CLINIC_OR_DEPARTMENT_OTHER): Payer: Self-pay | Admitting: *Deleted

## 2024-07-01 ENCOUNTER — Other Ambulatory Visit

## 2024-07-01 ENCOUNTER — Telehealth: Payer: Self-pay | Admitting: *Deleted

## 2024-07-01 NOTE — Telephone Encounter (Signed)
 Post ED Visit - Positive Culture Follow-up: Successful Patient Follow-Up  Culture assessed and recommendations reviewed by:  [x]  [name], Pharm.D. []  Venetia Gully, Pharm.D., BCPS AQ-ID []  Garrel Crews, Pharm.D., BCPS []  Almarie Lunger, Pharm.D., BCPS []  [place], [address].D., BCPS, AAHIVP []  Rosaline Bihari, Pharm.D., BCPS, AAHIVP []  Vernell Meier, PharmD, BCPS []  Latanya Hint, PharmD, BCPS []  Donald Medley, PharmD, BCPS []  Rocky Bold, PharmD  Positive urine culture  []  Patient discharged without antimicrobial prescription and treatment is now indicated [x]  Organism is resistant to prescribed ED discharge antimicrobial []  Patient with positive blood cultures  Changes discussed with ED provider: Candelario Kidney, PA New antibiotic prescription: Cefuroxime 500 mg po BID x 7 days Called to [facility] in [place]  Contacted patient, date 07/01/24, time 1700   Candace Allen 07/01/2024, 5:17 PM

## 2024-07-01 NOTE — Telephone Encounter (Signed)
" °  Follow up Call-     06/30/2024    7:22 AM  Call back number  Post procedure Call Back phone  # [phone]  Permission to leave phone message Yes     Patient questions:  Do you have a fever, pain , or abdominal swelling? No. Pain Score  0 *  Have you tolerated food without any problems? Yes.    Have you been able to return to your normal activities? Yes.    Do you have any questions about your discharge instructions: Diet   No. Medications  No. Follow up visit  No.  Do you have questions or concerns about your Care? No.  Actions: * If pain score is 4 or above: No action needed, pain <4.   "

## 2024-07-05 NOTE — Progress Notes (Signed)
 Complex Care Management Note  Care Guide Note 07/05/2024 Name: Candace Allen MRN: [id number] DOB: 14-Sep-1965  Candace Allen is a 59 y.o. year old female who sees Lavell, Bari LABOR, FNP for primary care. I reached out to Olam Arlean Heinz by phone today to offer complex care management services.  [name] was given information about Complex Care Management services today including:   The Complex Care Management services include support from the care team which includes your Nurse Care Manager, Clinical Social Worker, or Pharmacist.  The Complex Care Management team is here to help remove barriers to the health concerns and goals most important to you. Complex Care Management services are voluntary, and the patient may decline or stop services at any time by request to their care team member.   Complex Care Management Consent Status: Patient did not agree to participate in complex care management services at this time.  Follow up plan:  Patient will follow up with PCP  Encounter Outcome:  Patient Refused  [name]  [facility], [facility] VBCI Assistant Direct Dial: [phone]  Fax: [phone]

## 2024-07-13 ENCOUNTER — Telehealth: Payer: Self-pay | Admitting: Gastroenterology

## 2024-07-13 ENCOUNTER — Ambulatory Visit: Payer: Self-pay

## 2024-07-13 NOTE — Telephone Encounter (Signed)
 Nd cbc looked good when last checked. Review of med list was also done. Lab results nor meds could be causing migraines. We can rfer her to migraine clinic if needed  Mary-Margaret Gladis, FNP

## 2024-07-13 NOTE — Telephone Encounter (Signed)
 Inbound call from patient stating that she had a procedure done on the 14 th of January and since then she has had on and off headaches. Patient is complaining now of Nausea companied with Jaw and facial pain. Patient is unsure if the two are related. Patient is requesting to speak to some one in regards to this situation. Please advise.

## 2024-07-13 NOTE — Telephone Encounter (Signed)
 FYI Only or Action Required?: Action required by provider: clinical question for provider and update on patient condition.  Patient was last seen in primary care on 05/10/2024 by Lavell Bari LABOR, FNP.  Called Nurse Triage reporting Migraine.  Symptoms began several weeks ago.  Interventions attempted: OTC medications: tylenol ; pseduophed and Other: caffeine.  Symptoms are: gradually worsening.  Triage Disposition: See HCP Within 4 Hours (Or PCP Triage)  Patient/caregiver understands and will follow disposition?: No, wishes to speak with PCP     Message from Kevelyn M sent at 07/13/2024 11:48 AM EST  Reason for Triage: Patient is experiencing severe migraine headache for a couple weeks. Would like her provider to look at her blood work to see if there is anything that could cause the migraines in her lab results. She is still stage 3 kidney disease and is trying not to damage her liver anymore. Doesn't want to go back to the emergency room. Patient is crying.     Reason for Disposition  [1] SEVERE headache (e.g., excruciating) AND [2] not improved after 2 hours of pain medicine  Answer Assessment - Initial Assessment Questions PT called requesting PCP to review labs from 07/01/24; per chart pt had lab appointment but no results are viewable. Pt very tearful, inconsolable at times about h/a and being in pain. Pt states I am so tired of being in pain while sobbing. Pt is currently sitting in shower as she reports warm water sometimes helps; pt does report dizziness with h/a but has vent in place and shower curtain open for airflow. Pt states she took 3 tylenol  last night and within 4h she was awake and crying in pain. Pt states in the past she has experienced barometric pressure h/a which have been relieved with psudophed; no relief at this time. Pt is worried that high dose abx are causing h/a d/t possible kidney infection. Pt very tearful, allowed her to decompress and vent. 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 Pt denies any similar symptoms. Reassured her that I would send to PCP for her to review labs but reinforced ED disposition, no appt made. Encouraged safety d/t dizziness and discussed application of counter pressure and migraine skull caps d/t pt reporting light sensitivity. Pt is available via phone and requested call back today from PCP's nurse or PCP directly.      1. LOCATION: Where does it hurt?      Frontal; pt reports hx of barometic pressure h/a in the past and TIA x 2 with stress   2. ONSET: When did the headache start? (e.g., minutes, hours, days)      Ongoing x 2 weeks with little relief   3. PATTERN: Does the pain come and go, or has it been constant since it started?     Constant   4. SEVERITY: How bad is the pain? and What does it keep you from doing?  (e.g., Scale 1-10; mild, moderate, or severe)     Severe   5. RECURRENT SYMPTOM: Have you ever had headaches before? If Yes, ask: When was the last time? and What happened that time?      Yes; pt reports h/a usually improve with tylenol  and pseudoephedrine but no relief at this time.   6. CAUSE: What do you think is causing the headache?     Unsure   8. HEAD INJURY: Has there been any recent injury to your head?      No  9. OTHER SYMPTOMS: Do you have any other symptoms? (e.g., fever, stiff neck, eye pain, sore throat, cold symptoms)     Pt reported stiffness to neck to previous NT; no reports of other symptoms to this RN  Protocols used: [facility]

## 2024-07-13 NOTE — Telephone Encounter (Signed)
 FYI Only or Action Required?: FYI only for provider: ED advised.  Patient was last seen in primary care on 05/10/2024 by Lavell Bari LABOR, FNP.  Called Nurse Triage reporting Headache.  Symptoms began several weeks ago.  Interventions attempted: Nothing.  Symptoms are: unchanged.  Triage Disposition: Go to ED Now (Notify PCP)  Patient/caregiver understands and will follow disposition?: No, wishes to speak with PCP  Reason for Triage: Patient has severe headache    Reason for Disposition  Stiff neck (can't touch chin to chest)  Answer Assessment - Initial Assessment Questions 2. ONSET: When did the headache start? (e.g., minutes, hours, days)      2 weeks, started after the colonoscopy 3. PATTERN: Does the pain come and go, or has it been constant since it started?     constant 4. SEVERITY: How bad is the pain? and What does it keep you from doing?  (e.g., Scale 1-10; mild, moderate, or severe)     7-10 6. CAUSE: What do you think is causing the headache?     Well they told me to call if I had a headache after my colonoscopy, I have had a headache the whole time 8. HEAD INJURY: Has there been any recent injury to your head?      denies 9. OTHER SYMPTOMS: Do you have any other symptoms? (e.g., fever, stiff neck, eye pain, sore throat, cold symptoms) Stiff neck, HA made worse by noise and light  Pt states that she cannot touch her chin to her chest d/t neck stiffness. Pt denies fever. Pt rates HA moderate to severe-always above 7. Pt advised to go to ED, pt refuses. Would like PCP to speak to her.  Protocols used: [facility]

## 2024-07-13 NOTE — Telephone Encounter (Signed)
 Ed advised

## 2024-07-13 NOTE — Telephone Encounter (Signed)
 Patient states that she has had intermittent headaches with some nausea over the last couple weeks and wonders if it could be related to 06/30/24 colonoscopy. We discussed that headache related to procedure a couple weeks ago is unlikely. I recommended that she reach out to her PCP to discuss headaches and be evaluated. She states that she did this and they only wanted to send her to the ER. States she recently did that for a kidney stone and does not want to wait for hours and catch something else. Patient continues to explain that she has high pain tolerance but keeps getting headaches that Tylenol  does not help. Does not want to take any NSAIDs.  I have recommended that she reach back out to PCP with continued symptoms and ER visit if her symptoms worsen. She verbalizes understanding.

## 2024-07-14 NOTE — Telephone Encounter (Signed)
Pt declines referral at this time

## 2024-08-12 ENCOUNTER — Ambulatory Visit: Admitting: Family

## 2024-09-06 ENCOUNTER — Ambulatory Visit: Admitting: Urology
